# Patient Record
Sex: Male | Born: 1960
Health system: Southern US, Community
[De-identification: ages and names within clinical notes are randomized; demographics above are authoritative.]

## PROBLEM LIST (undated history)

## (undated) DIAGNOSIS — J45909 Unspecified asthma, uncomplicated: Secondary | ICD-10-CM

## (undated) DIAGNOSIS — M1990 Unspecified osteoarthritis, unspecified site: Secondary | ICD-10-CM

## (undated) DIAGNOSIS — T7840XA Allergy, unspecified, initial encounter: Secondary | ICD-10-CM

## (undated) HISTORY — PX: SHOULDER ARTHROSCOPY WITH BICEPS TENDON REPAIR: SHX5674

## (undated) HISTORY — DX: Unspecified osteoarthritis, unspecified site: M19.90

## (undated) HISTORY — DX: Allergy, unspecified, initial encounter: T78.40XA

## (undated) HISTORY — DX: Unspecified asthma, uncomplicated: J45.909

---

## 1992-08-20 HISTORY — PX: MANDIBLE SURGERY: SHX707

## 2000-08-20 HISTORY — PX: CARPAL TUNNEL RELEASE: SHX101

## 2001-03-14 ENCOUNTER — Encounter: Admission: RE | Admit: 2001-03-14 | Discharge: 2001-03-14 | Payer: Self-pay | Admitting: *Deleted

## 2001-03-14 ENCOUNTER — Encounter: Payer: Self-pay | Admitting: Family Medicine

## 2001-03-26 ENCOUNTER — Encounter: Admission: RE | Admit: 2001-03-26 | Discharge: 2001-04-17 | Payer: Self-pay | Admitting: Family Medicine

## 2005-06-04 ENCOUNTER — Ambulatory Visit: Payer: Self-pay | Admitting: Gastroenterology

## 2005-06-18 ENCOUNTER — Ambulatory Visit: Payer: Self-pay | Admitting: Gastroenterology

## 2005-07-25 ENCOUNTER — Emergency Department (HOSPITAL_COMMUNITY): Admission: EM | Admit: 2005-07-25 | Discharge: 2005-07-25 | Payer: Self-pay | Admitting: Family Medicine

## 2006-01-15 ENCOUNTER — Emergency Department (HOSPITAL_COMMUNITY): Admission: EM | Admit: 2006-01-15 | Discharge: 2006-01-15 | Payer: Self-pay | Admitting: Family Medicine

## 2006-05-22 IMAGING — CR DG WRIST COMPLETE 3+V*R*
4 series · 4 of 4 positions shown · non-contrast
Comparison: none

CLINICAL DATA: 44-year-old, wrist swelling, no known injury.   
 RIGHT WRIST - 3 VIEW:
 Joint spaces are maintained.  No fractures are seen.  No significant degenerative changes.

[view not recorded (1 of 4)]
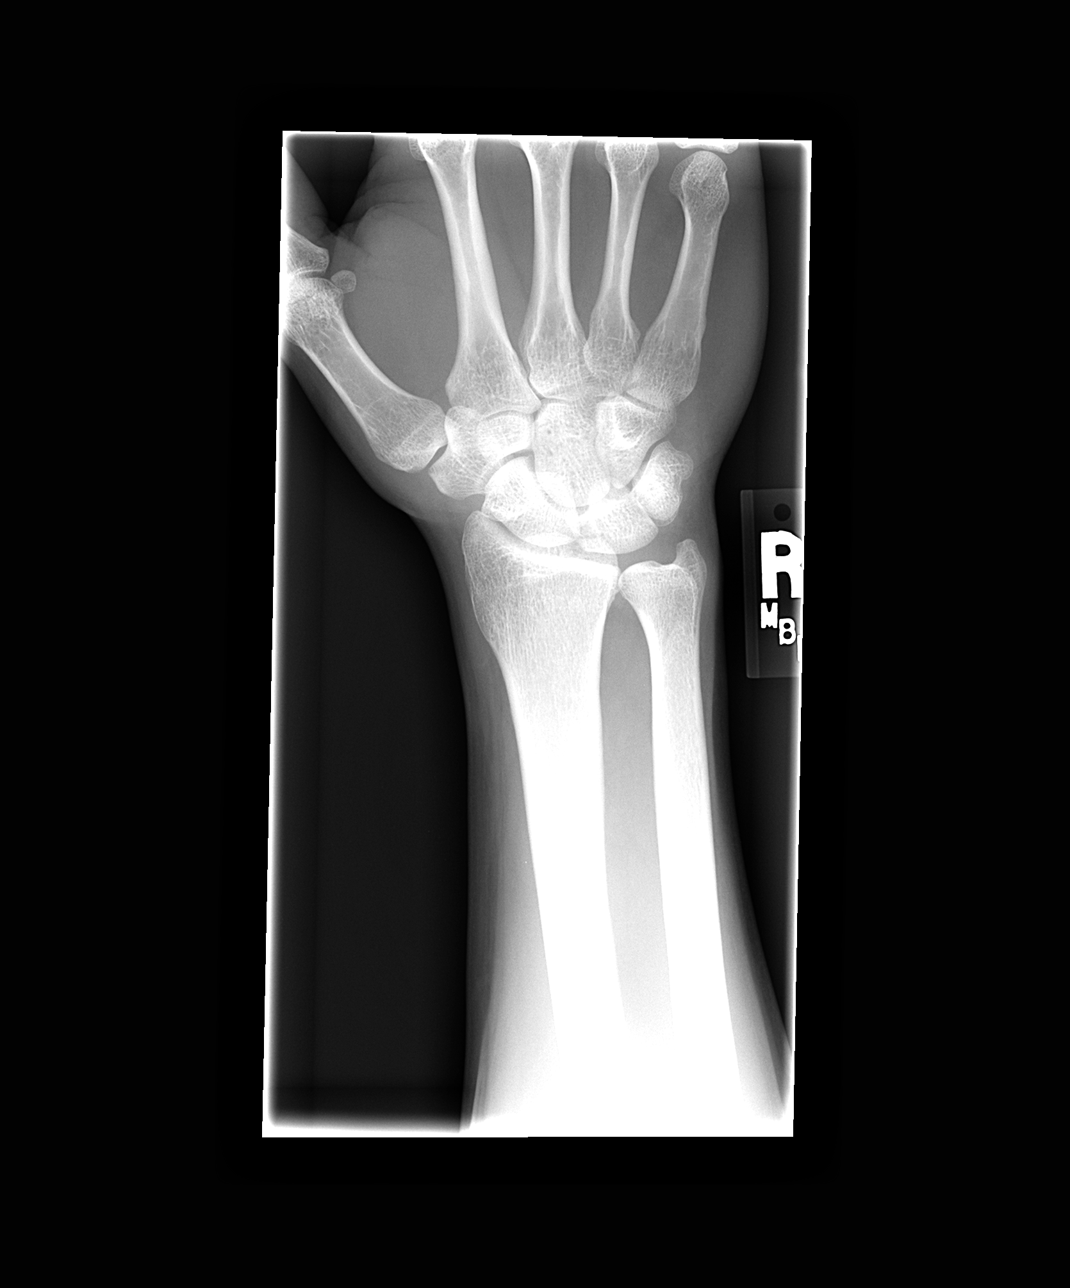

[view not recorded (2 of 4)]
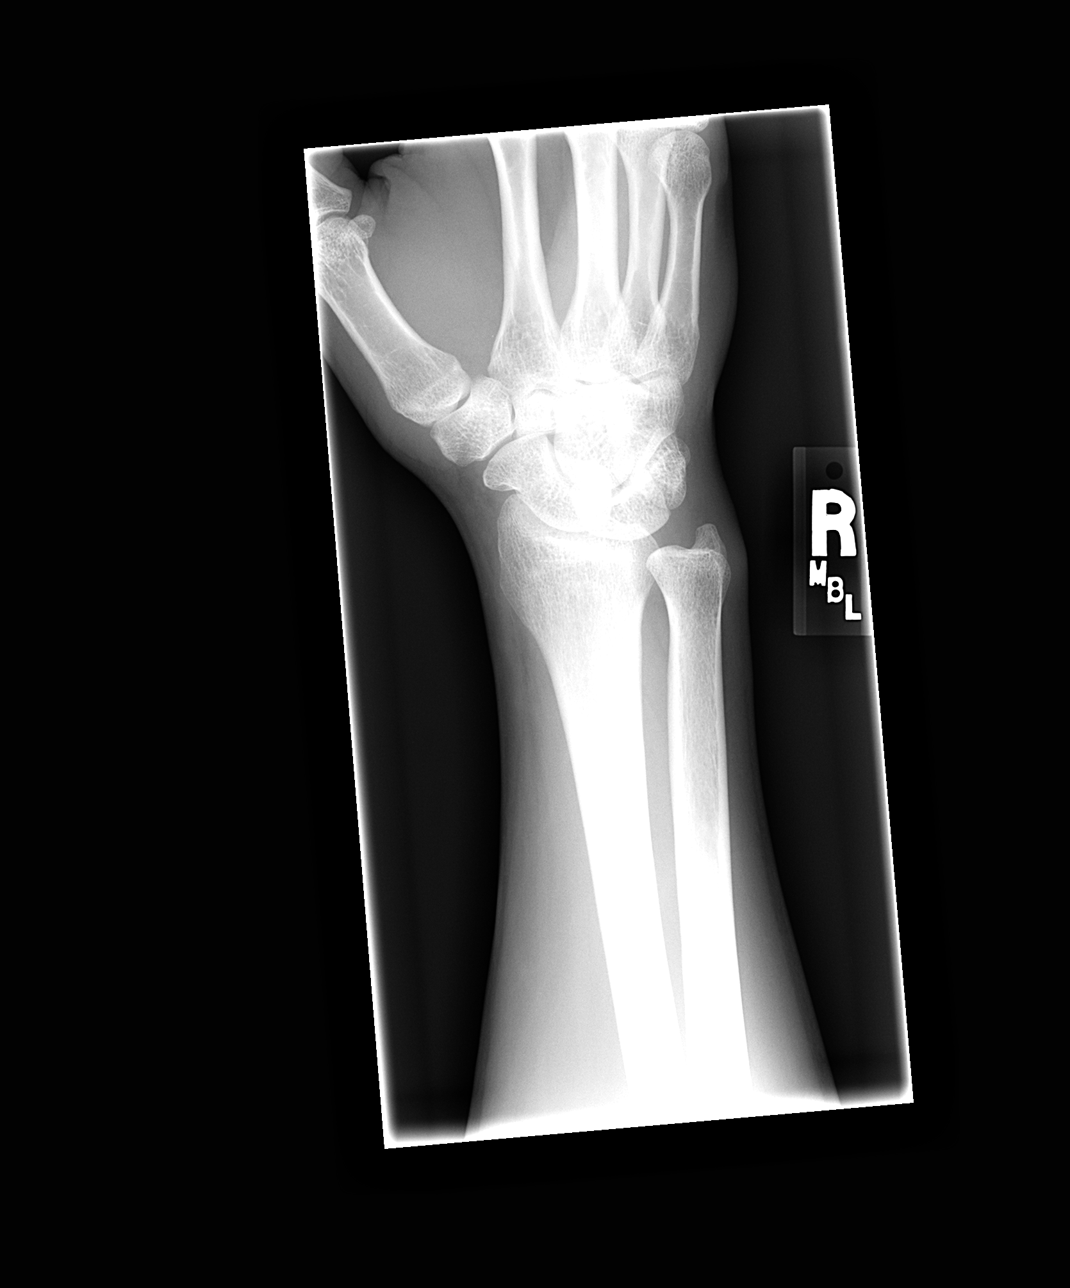

[view not recorded (3 of 4)]
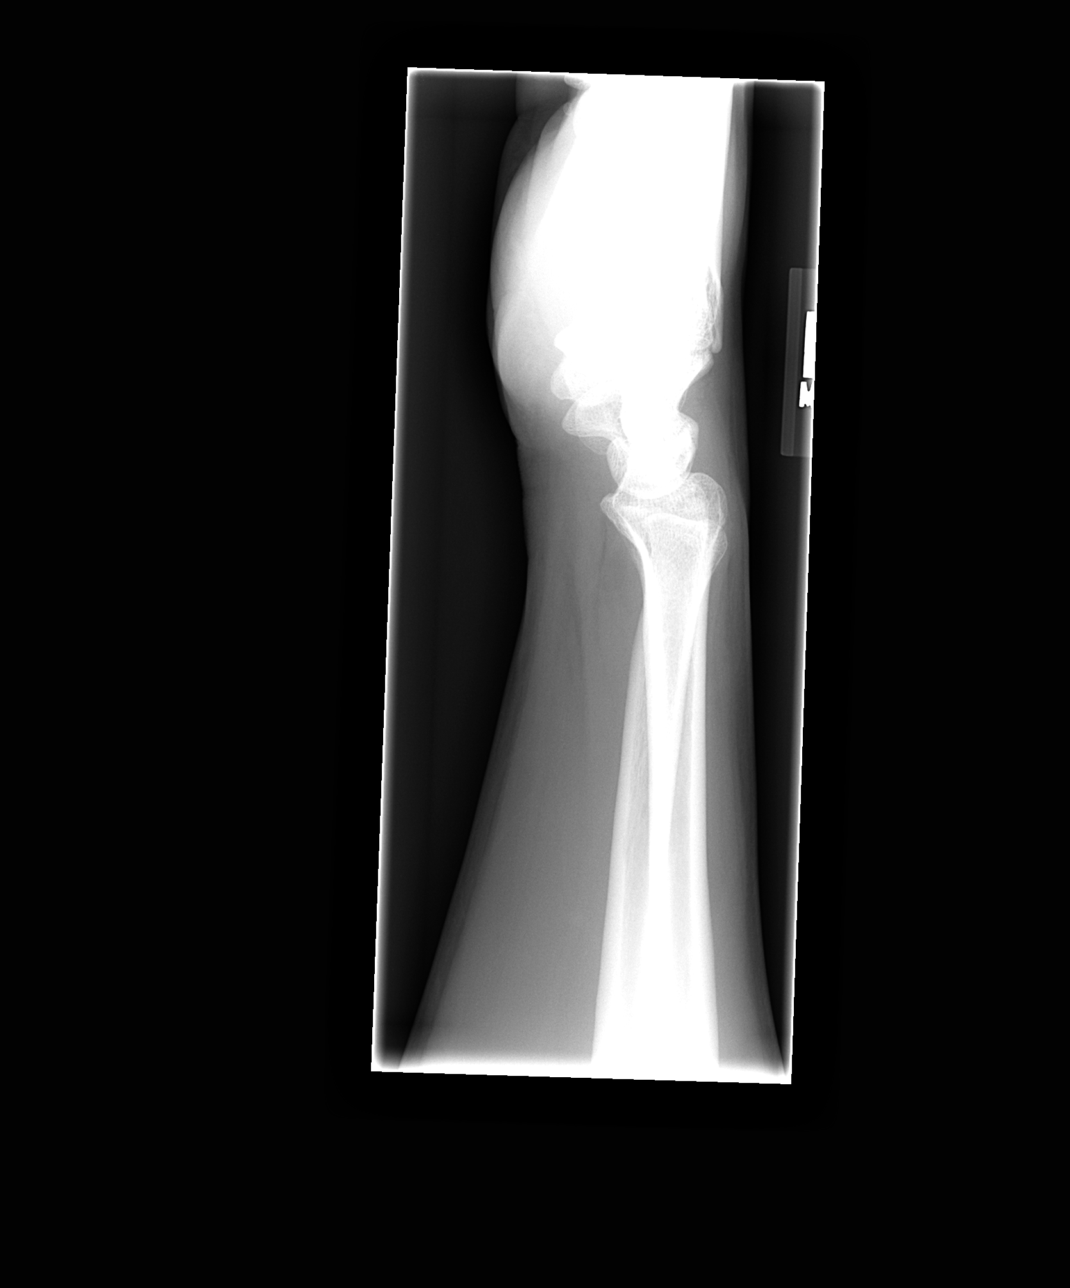

[view not recorded (4 of 4)]
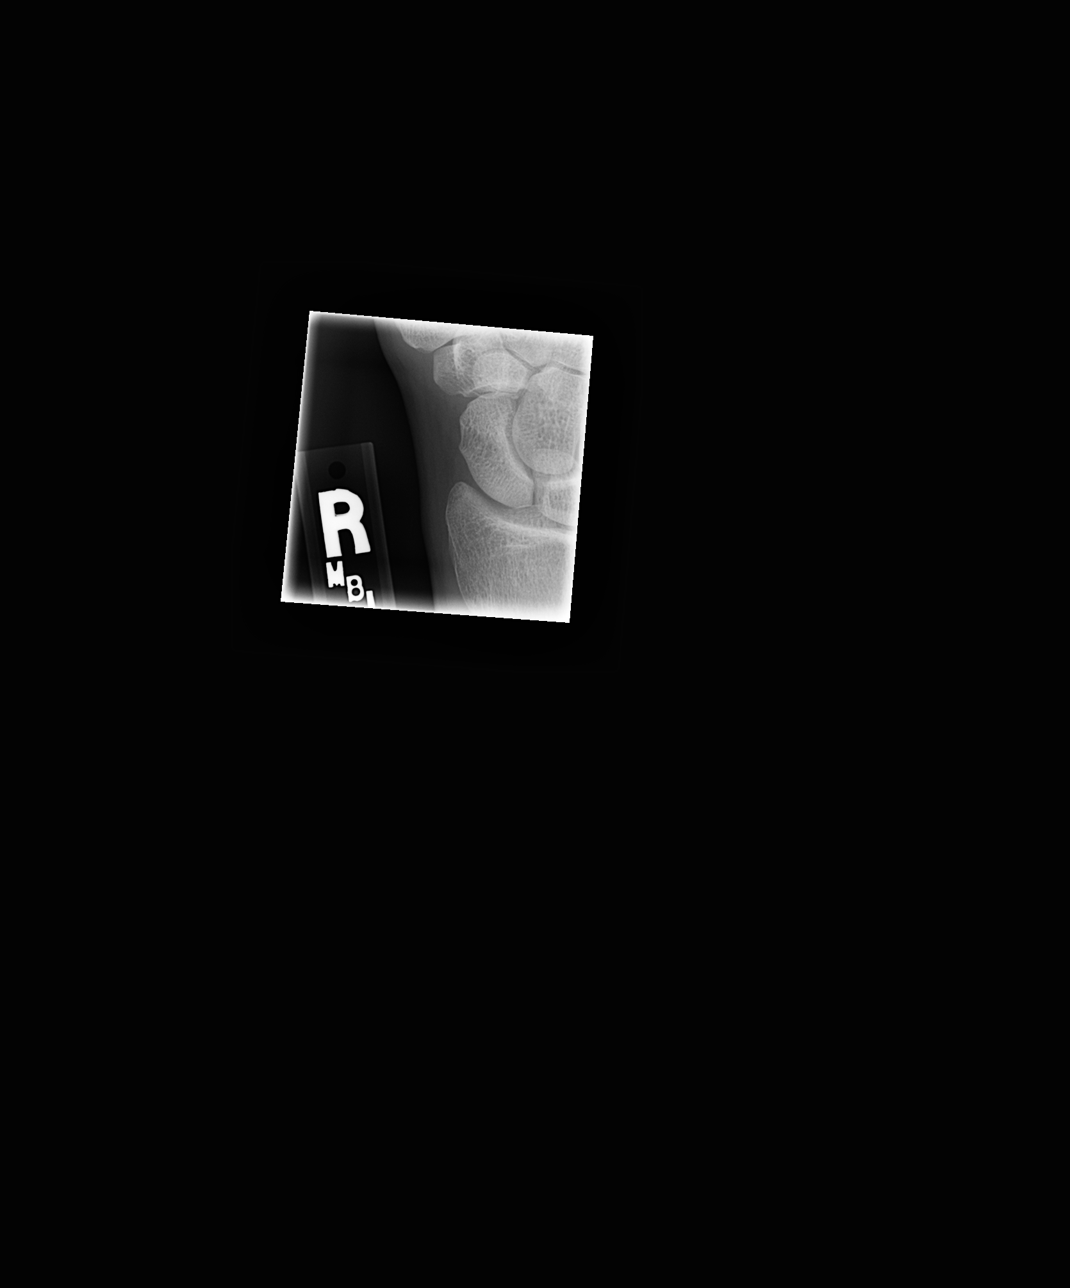

[4 of 4 positions shown; findings below may reference images not displayed]

IMPRESSION: No acute bony findings.

## 2010-07-18 ENCOUNTER — Encounter: Payer: Self-pay | Admitting: Gastroenterology

## 2010-09-21 NOTE — Letter (Signed)
Summary: Colonoscopy Letter  [facility]  [address]   [place], [place] [address]   Phone: [phone]  Fax: [phone]      July 18, 2010 MRN: [id number]   MICKAL MENO [address] [place], [place]  [address]   Dear Mr. Klutts,   According to your medical record, it is time for you to schedule a Colonoscopy. The American Cancer Society recommends this procedure as a method to detect early colon cancer. Patients with a family history of colon cancer, or a personal history of colon polyps or inflammatory bowel disease are at increased risk.  This letter has been generated based on the recommendations made at the time of your procedure. If you feel that in your particular situation this may no longer apply, please contact our office.  Please call our office at [phone] to schedule this appointment or to update your records at your earliest convenience.  Thank you for cooperating with [place] to provide you with the very best care possible.   Sincerely,   Barbette Hair. Arlyce Dice, M.D.  [facility] [phone]

## 2013-05-09 ENCOUNTER — Ambulatory Visit (INDEPENDENT_AMBULATORY_CARE_PROVIDER_SITE_OTHER): Payer: PRIVATE HEALTH INSURANCE | Admitting: Family Medicine

## 2013-05-09 VITALS — BP 130/86 | HR 68 | Temp 98.0°F | Resp 16 | Ht 71.0 in | Wt 186.0 lb

## 2013-05-09 DIAGNOSIS — R05 Cough: Secondary | ICD-10-CM

## 2013-05-09 DIAGNOSIS — R059 Cough, unspecified: Secondary | ICD-10-CM

## 2013-05-09 DIAGNOSIS — H109 Unspecified conjunctivitis: Secondary | ICD-10-CM

## 2013-05-09 MED ORDER — TOBRAMYCIN 0.3 % OP SOLN: 1.0000 [drp] | Freq: Four times a day (QID) | OPHTHALMIC | Status: DC

## 2013-05-09 MED ORDER — HYDROCODONE-HOMATROPINE 5-1.5 MG/5ML PO SYRP: 5.0000 mL | ORAL_SOLUTION | Freq: Three times a day (TID) | ORAL | Status: DC | PRN

## 2013-05-09 MED ORDER — AZITHROMYCIN 250 MG PO TABS: ORAL_TABLET | ORAL | Status: DC

## 2013-05-09 NOTE — Patient Instructions (Addendum)
Cough, Adult  A cough is a reflex that helps clear your throat and airways. It can help heal the body or may be a reaction to an irritated airway. A cough may only last 2 or 3 weeks (acute) or may last more than 8 weeks (chronic).  CAUSES Acute cough:  Viral or bacterial infections. Chronic cough:  Infections.  Allergies.  Asthma.  Post-nasal drip.  Smoking.  Heartburn or acid reflux.  Some medicines.  Chronic lung problems (COPD).  Cancer. SYMPTOMS   Cough.  Fever.  Chest pain.  Increased breathing rate.  High-pitched whistling sound when breathing (wheezing).  Colored mucus that you cough up (sputum). TREATMENT   A bacterial cough may be treated with antibiotic medicine.  A viral cough must run its course and will not respond to antibiotics.  Your caregiver may recommend other treatments if you have a chronic cough. HOME CARE INSTRUCTIONS   Only take over-the-counter or prescription medicines for pain, discomfort, or fever as directed by your caregiver. Use cough suppressants only as directed by your caregiver.  Use a cold steam vaporizer or humidifier in your bedroom or home to help loosen secretions.  Sleep in a semi-upright position if your cough is worse at night.  Rest as needed.  Stop smoking if you smoke. SEEK IMMEDIATE MEDICAL CARE IF:   You have pus in your sputum.  Your cough starts to worsen.  You cannot control your cough with suppressants and are losing sleep.  You begin coughing up blood.  You have difficulty breathing.  You develop pain which is getting worse or is uncontrolled with medicine.  You have a fever. MAKE SURE YOU:   Understand these instructions.  Will watch your condition.  Will get help right away if you are not doing well or get worse. Document Released: 02/02/2011 Document Revised: 10/29/2011 Document Reviewed: 02/02/2011 ExitCare Patient Information 2014 [employer]. Conjunctivitis Conjunctivitis is  commonly called "pink eye." Conjunctivitis can be caused by bacterial or viral infection, allergies, or injuries. There is usually redness of the lining of the eye, itching, discomfort, and sometimes discharge. There may be deposits of matter along the eyelids. A viral infection usually causes a watery discharge, while a bacterial infection causes a yellowish, thick discharge. Pink eye is very contagious and spreads by direct contact. You may be given antibiotic eyedrops as part of your treatment. Before using your eye medicine, remove all drainage from the eye by washing gently with warm water and cotton balls. Continue to use the medication until you have awakened 2 mornings in a row without discharge from the eye. Do not rub your eye. This increases the irritation and helps spread infection. Use separate towels from other household members. Wash your hands with soap and water before and after touching your eyes. Use cold compresses to reduce pain and sunglasses to relieve irritation from light. Do not wear contact lenses or wear eye makeup until the infection is gone. SEEK MEDICAL CARE IF:   Your symptoms are not better after 3 days of treatment.  You have increased pain or trouble seeing.  The outer eyelids become very red or swollen. Document Released: 09/13/2004 Document Revised: 10/29/2011 Document Reviewed: 08/06/2005 ExitCare Patient Information 2014 [employer].  

## 2013-05-09 NOTE — Progress Notes (Signed)
Chief Complaint:  Chief Complaint  Patient presents with  . Conjunctivitis    x today  . Cough    x 2 day    HPI: 52 y.o. year old male presents with a 2 day history of eye redness involving both  eye.  no contact use  No sick contacts, recent antibiotics, or recent travels.   No leg trauma, sedentary periods, h/o cancer, or tobacco use.  Past Medical History  Diagnosis Date  . Arthritis   . Allergy      Home Meds: Prior to Admission medications   Medication Sig Start Date End Date Taking? Authorizing Provider  Adalimumab (HUMIRA PEN [place]) Inject into the skin.   Yes Historical Provider, MD  Folic Acid-Vit B6-Vit B12 (FOLGARD PO) Take by mouth.   Yes Historical Provider, MD  loratadine (CLARITIN) 10 MG tablet Take 10 mg by mouth daily.   Yes Historical Provider, MD  omeprazole (PRILOSEC) 20 MG capsule Take 20 mg by mouth daily.   Yes Historical Provider, MD  azithromycin (ZITHROMAX Z-PAK) 250 MG tablet Take as directed on pack 05/09/13   Elvina Sidle, MD  HYDROcodone-homatropine [facility]) 5-1.5 MG/5ML syrup Take 5 mLs by mouth every 8 (eight) hours as needed for cough. 05/09/13   Elvina Sidle, MD  tobramycin (TOBREX) 0.3 % ophthalmic solution Place 1 drop into both eyes every 6 (six) hours. 05/09/13   Elvina Sidle, MD    Allergies:  Allergies  Allergen Reactions  . Sulfa Antibiotics Hives    History   Social History  . Marital Status: Married    Spouse Name: N/A    Number of Children: N/A  . Years of Education: N/A   Occupational History  . Not on file.   Social History Main Topics  . Smoking status: Never Smoker   . Smokeless tobacco: Not on file  . Alcohol Use: No  . Drug Use: No  . Sexual Activity: Yes   Other Topics Concern  . Not on file   Social History Narrative  . No narrative on file     Review of Systems: Constitutional: negative for chills, fever, night sweats or weight changes Cardiovascular: negative for chest pain or  palpitations Respiratory: negative for hemoptysis, wheezing, or shortness of breath Abdominal: negative for abdominal pain, nausea, vomiting or diarrhea Dermatological: negative for rash Neurologic: negative for headache   Physical Exam: Blood pressure 130/86, pulse 68, temperature 98 F (36.7 C), temperature source Oral, resp. rate 16, height 5\' 11"  (1.803 m), weight 186 lb (84.369 kg), SpO2 99.00%., Body mass index is 25.95 kg/(m^2). General: Well developed, well nourished, in no acute distress. Head: Normocephalic, atraumatic, nares are congested. Bilateral auditory canals clear, TM's are without perforation, pearly grey with reflective cone of light bilaterally. No sinus TTP. Oral cavity moist, dentition normal. Posterior pharynx with post nasal drip and mild erythema. No peritonsillar abscess or tonsillar exudate. Eyes:  Injection both eye, mild discharge at lid margins Neck: Supple. No thyromegaly. Full ROM. No lymphadenopathy. Lungs: Coarse breath sounds bilaterally without wheezes, rales, or rhonchi. Breathing is unlabored.  Heart: RRR with S1 S2. No murmurs, rubs, or gallops appreciated. Msk:  Strength and tone normal for age. Extremities: No clubbing or cyanosis. No edema. No joint deformity Neuro: Alert and oriented X 3. Moves all extremities spontaneously. CNII-XII grossly in tact. Psych:  Responds to questions appropriately with a normal affect.   Labs:   ASSESSMENT AND PLAN:  52 y.o. year old male with conjunctivitis involving both eyes.  Also has dry cough and is taking Humira for RA. - Pt ed given including hygiene measures Conjunctivitis - Plan: tobramycin (TOBREX) 0.3 % ophthalmic solution  Cough - Plan: azithromycin (ZITHROMAX Z-PAK) 250 MG tablet, HYDROcodone-homatropine (HYCODAN) 5-1.5 MG/5ML syrup   -RTC precautions -RTC 3-5 days if no improvement  Signed, Elvina Sidle, MD 05/09/2013 2:03 PM

## 2014-08-23 ENCOUNTER — Other Ambulatory Visit (HOSPITAL_COMMUNITY): Payer: Self-pay | Admitting: *Deleted

## 2014-08-24 ENCOUNTER — Encounter (HOSPITAL_COMMUNITY)
Admission: RE | Admit: 2014-08-24 | Discharge: 2014-08-24 | Disposition: A | Discharge status: Home / Self Care | Payer: PRIVATE HEALTH INSURANCE | Source: Ambulatory Visit | Attending: Rheumatology | Admitting: Rheumatology

## 2014-08-24 DIAGNOSIS — M069 Rheumatoid arthritis, unspecified: Secondary | ICD-10-CM | POA: Insufficient documentation

## 2014-08-24 MED ORDER — SODIUM CHLORIDE 0.9 % IV SOLN
176.3600 mg | Freq: Once | INTRAVENOUS | Status: DC
  Administered 2014-08-24: 176.36 mg via INTRAVENOUS
  Filled 2014-08-24: qty 14.1

## 2014-08-24 MED ORDER — SODIUM CHLORIDE 0.9 % IV SOLN
INTRAVENOUS | Status: DC
  Administered 2014-08-24: 14:00:00 250 mL via INTRAVENOUS
  Administered 2014-08-24: 14:00:00 via INTRAVENOUS

## 2014-10-18 ENCOUNTER — Other Ambulatory Visit (HOSPITAL_COMMUNITY): Payer: Self-pay | Admitting: *Deleted

## 2014-10-19 ENCOUNTER — Encounter (HOSPITAL_COMMUNITY)
Admission: RE | Admit: 2014-10-19 | Discharge: 2014-10-19 | Disposition: A | Discharge status: Home / Self Care | Payer: PRIVATE HEALTH INSURANCE | Source: Ambulatory Visit | Attending: Rheumatology | Admitting: Rheumatology

## 2014-10-19 DIAGNOSIS — M069 Rheumatoid arthritis, unspecified: Secondary | ICD-10-CM | POA: Diagnosis present

## 2014-10-19 MED ORDER — SODIUM CHLORIDE 0.9 % IV SOLN
176.3600 mg | Freq: Once | INTRAVENOUS | Status: AC
  Administered 2014-10-19: 14:00:00 176.36 mg via INTRAVENOUS
  Filled 2014-10-19: qty 14.1

## 2014-10-19 MED ORDER — SODIUM CHLORIDE 0.9 % IV SOLN: INTRAVENOUS | Status: DC

## 2014-12-21 ENCOUNTER — Encounter (HOSPITAL_COMMUNITY): Payer: Self-pay

## 2016-05-29 ENCOUNTER — Ambulatory Visit: Payer: PRIVATE HEALTH INSURANCE | Admitting: Rheumatology

## 2016-05-29 DIAGNOSIS — M79641 Pain in right hand: Secondary | ICD-10-CM | POA: Diagnosis not present

## 2016-05-29 DIAGNOSIS — M0579 Rheumatoid arthritis with rheumatoid factor of multiple sites without organ or systems involvement: Secondary | ICD-10-CM | POA: Diagnosis not present

## 2016-05-29 DIAGNOSIS — Z09 Encounter for follow-up examination after completed treatment for conditions other than malignant neoplasm: Secondary | ICD-10-CM

## 2016-05-29 DIAGNOSIS — M79642 Pain in left hand: Secondary | ICD-10-CM | POA: Diagnosis not present

## 2016-06-15 ENCOUNTER — Telehealth: Payer: Self-pay | Admitting: Radiology

## 2016-06-15 ENCOUNTER — Other Ambulatory Visit: Payer: Self-pay | Admitting: Rheumatology

## 2016-06-15 DIAGNOSIS — Z9225 Personal history of immunosupression therapy: Secondary | ICD-10-CM

## 2016-06-15 NOTE — Telephone Encounter (Signed)
Last visit 05/29/16 Next visit Labs 04/30/16 He is due for TB gold will call him,  

## 2016-06-15 NOTE — Telephone Encounter (Signed)
Patient needs follow up appt with Dr Corliss Skains pls call to make appt.  HE was here on 05/29/16, he should have been sch for a 6 month follow up please call him

## 2016-06-15 NOTE — Telephone Encounter (Signed)
Last visit 05/29/16 Next visit Labs 04/30/16 He is due for TB gold will call him,

## 2016-06-18 NOTE — Telephone Encounter (Signed)
LMOM for patient to call back and schedule April follow up.  

## 2016-06-19 NOTE — Telephone Encounter (Signed)
I have called him to advise. Left message mailed him the lab order

## 2016-07-03 ENCOUNTER — Telehealth: Payer: Self-pay | Admitting: Radiology

## 2016-07-03 ENCOUNTER — Encounter: Payer: Self-pay | Admitting: Rheumatology

## 2016-07-03 NOTE — Telephone Encounter (Signed)
Labs WNL CBC CMP will call pt / sent for scan

## 2016-07-03 NOTE — Telephone Encounter (Signed)
I have called patient to advise labs are normal  

## 2016-07-03 NOTE — Telephone Encounter (Signed)
TB gold also neg / abstracted

## 2016-08-10 ENCOUNTER — Other Ambulatory Visit: Payer: Self-pay | Admitting: Rheumatology

## 2016-08-10 NOTE — Telephone Encounter (Signed)
Last Visit: 05/29/16 Next Visit: 11/20/16 Labs: 06/29/16 Creat. 1.28 TB Gold: 06/29/16 Neg  Okay to refill Simponi?

## 2016-08-10 NOTE — Telephone Encounter (Signed)
ok 

## 2016-08-22 ENCOUNTER — Other Ambulatory Visit: Payer: Self-pay | Admitting: Rheumatology

## 2016-08-22 MED ORDER — GOLIMUMAB 50 MG/0.5ML ~~LOC~~ SOSY: PREFILLED_SYRINGE | SUBCUTANEOUS | 2 refills | Status: DC

## 2016-08-22 NOTE — Telephone Encounter (Signed)
Patient asked this be sent to [facility] not [facility]. Please resend to [facility]. Cb# [phone]

## 2016-08-22 NOTE — Telephone Encounter (Signed)
Okay 

## 2016-08-22 NOTE — Telephone Encounter (Signed)
Patient is requesting refill of Simponi be sent to [facility]. Fax# [phone]. He is requesting that it be sent by 2pm in order for him to receive it in time for when he is due.

## 2016-08-22 NOTE — Telephone Encounter (Signed)
Last Visit: 05/29/16 Next Visit: 11/20/16 Labs: 06/29/16 Creat. 1.28 TB Gold: 06/29/16 Neg  Okay to refill Simponi?  

## 2016-08-23 ENCOUNTER — Other Ambulatory Visit: Payer: Self-pay | Admitting: *Deleted

## 2016-08-23 MED ORDER — LEFLUNOMIDE 10 MG PO TABS: 10.0000 mg | ORAL_TABLET | Freq: Every day | ORAL | 0 refills | Status: DC

## 2016-08-23 MED ORDER — FOLGARD PO TABS: 2.0000 | ORAL_TABLET | Freq: Every day | ORAL | 3 refills | Status: DC

## 2016-08-23 MED ORDER — GOLIMUMAB 50 MG/0.5ML ~~LOC~~ SOSY: PREFILLED_SYRINGE | SUBCUTANEOUS | 2 refills | Status: DC

## 2016-08-23 NOTE — Telephone Encounter (Signed)
ok 

## 2016-08-23 NOTE — Progress Notes (Signed)
Prescription resent to [facility] instead of patient's local pharmacy.

## 2016-08-23 NOTE — Telephone Encounter (Signed)
Refill request received via fax for Folgard and Leflunomide   Last Visit: 05/29/16 Next Visit: 11/20/16 Labs: 06/29/16 Creat. 1.28  Okay to refill Folgard and Leflunomide?

## 2016-08-24 ENCOUNTER — Telehealth: Payer: Self-pay | Admitting: Rheumatology

## 2016-08-24 NOTE — Telephone Encounter (Signed)
Timothy Waters w/[facility] states the accidentally put the wrong instructions on the patients Simponi injection; 100 mg instead of 50mg . They have contacted the patient and he is aware to only take 50mg 

## 2016-09-13 ENCOUNTER — Other Ambulatory Visit: Payer: Self-pay | Admitting: Rheumatology

## 2016-09-17 ENCOUNTER — Other Ambulatory Visit: Payer: Self-pay | Admitting: *Deleted

## 2016-09-17 MED ORDER — OMEPRAZOLE 20 MG PO CPDR: 20.0000 mg | DELAYED_RELEASE_CAPSULE | Freq: Every day | ORAL | 0 refills | Status: DC

## 2016-09-17 NOTE — Telephone Encounter (Signed)
Refill Request received via fax for Omeprazole   Last Visit: 05/29/16 Next Visit: 11/20/16 Labs: 06/29/16 Creat. 1.28  Okay to refill Omeprazole?

## 2016-09-17 NOTE — Telephone Encounter (Signed)
ok 

## 2016-09-25 ENCOUNTER — Telehealth: Payer: Self-pay | Admitting: Rheumatology

## 2016-09-25 NOTE — Telephone Encounter (Signed)
Patient states the rx for omeprazole was the incorrect dosage. He states he was supposed to receive 40 mg a day for a 90 day supply and only received 20 mg a day for 90 days. Patient is requesting this rx be corrected and resent to [facility].

## 2016-09-26 MED ORDER — OMEPRAZOLE 40 MG PO CPDR: 40.0000 mg | DELAYED_RELEASE_CAPSULE | Freq: Every day | ORAL | 0 refills | Status: DC

## 2016-09-26 NOTE — Telephone Encounter (Signed)
Per Mr. Leane Call okay to send in the Omeprazole 40 mg for 90 supply. Prescription sent to the pharmacy. Patient advised.

## 2016-09-27 ENCOUNTER — Other Ambulatory Visit: Payer: Self-pay | Admitting: *Deleted

## 2016-09-27 MED ORDER — OMEPRAZOLE 40 MG PO CPDR: 40.0000 mg | DELAYED_RELEASE_CAPSULE | Freq: Every day | ORAL | 0 refills | Status: DC

## 2016-10-16 ENCOUNTER — Other Ambulatory Visit: Payer: Self-pay | Admitting: Rheumatology

## 2016-10-16 NOTE — Telephone Encounter (Signed)
Previous Creat. On 04/30/16 was 1.40

## 2016-10-16 NOTE — Telephone Encounter (Signed)
Is Cr higher. Prev values?

## 2016-10-16 NOTE — Telephone Encounter (Signed)
Last Visit: 05/29/16 Next Visit: 11/20/16 Labs: 06/29/16 Creat. 1.28 Patient states he is going today or tomorrow to update labs  Okay to refill Arava?

## 2016-10-16 NOTE — Telephone Encounter (Signed)
ok 

## 2016-10-22 ENCOUNTER — Telehealth: Payer: Self-pay | Admitting: Rheumatology

## 2016-10-22 NOTE — Telephone Encounter (Signed)
Patient is at his family physician office and was going to have some lab work done while he is there.  He had a standing lab order but has since expired.  He is needing a new one faxed to their office. FAX#[phone].  Patient's [id number].  Thank you.

## 2016-10-23 ENCOUNTER — Other Ambulatory Visit: Payer: Self-pay | Admitting: *Deleted

## 2016-10-23 DIAGNOSIS — Z79899 Other long term (current) drug therapy: Secondary | ICD-10-CM

## 2016-10-23 NOTE — Telephone Encounter (Signed)
Lab Orders faxed

## 2016-10-23 NOTE — Telephone Encounter (Signed)
Patient called and stated that his doctor's office has not received his standing order so that he can get his lab done at the doctor's office @ 3:45 today.  Thank you.

## 2016-10-24 ENCOUNTER — Other Ambulatory Visit: Payer: Self-pay | Admitting: *Deleted

## 2016-10-24 MED ORDER — GOLIMUMAB 50 MG/0.5ML ~~LOC~~ SOSY: PREFILLED_SYRINGE | SUBCUTANEOUS | 2 refills | Status: DC

## 2016-10-24 NOTE — Telephone Encounter (Signed)
ok 

## 2016-10-24 NOTE — Telephone Encounter (Signed)
Refill Request received via fax  Last Visit: 05/29/16 Next Visit: 11/20/16 Labs: 06/29/16 Creat. 1.28 previous was 1.40 Patient updated labs yesterday. TB Gold: 06/29/16  Okay to refill Simponi?

## 2016-10-29 ENCOUNTER — Telehealth: Payer: Self-pay | Admitting: Rheumatology

## 2016-10-29 NOTE — Telephone Encounter (Signed)
Patient called about lab results since he has not heard anything from the office yet. Please advise.

## 2016-10-30 ENCOUNTER — Telehealth: Payer: Self-pay | Admitting: *Deleted

## 2016-10-30 NOTE — Telephone Encounter (Signed)
Received patient's CBC and CMP that were drawn on 10/23/16 and advised him they are normal .

## 2016-10-30 NOTE — Telephone Encounter (Signed)
Patient advised we have not received labs. Patient will call the lab and them faxed again. Patient provided with the fax number to our office again.

## 2016-11-08 DIAGNOSIS — Z79899 Other long term (current) drug therapy: Secondary | ICD-10-CM | POA: Insufficient documentation

## 2016-11-08 DIAGNOSIS — L80 Vitiligo: Secondary | ICD-10-CM | POA: Insufficient documentation

## 2016-11-08 DIAGNOSIS — M0579 Rheumatoid arthritis with rheumatoid factor of multiple sites without organ or systems involvement: Secondary | ICD-10-CM | POA: Insufficient documentation

## 2016-11-08 NOTE — Progress Notes (Signed)
Office Visit Note  Patient: Timothy Waters             Date of Birth: Aug 30, 1960           MRN: [id number]             PCP: No primary care provider on file. Referring: No ref. provider found Visit Date: 11/20/2016 Occupation: @GUAROCC @    Subjective:  Stiffness in hands   History of Present Illness: Timothy Waters is a 56 y.o. male with history of rheumatoid arthritis. According to patient he is gradually improving and he is about 95% better. He states he has some stiffness in his hands but he has not noticed any joint swelling. His foot discomfort has improved as well. After the last visit we tried Arava 20 mg by mouth daily that he had to decrease it down to 10 mg by mouth daily due to nausea. He's been taking Simponi subcutaneously without any problems.  Activities of Daily Living:  Patient reports morning stiffness forminutes.   Patient Denies nocturnal pain.  Difficulty dressing/grooming: Denies Difficulty climbing stairs: Denies Difficulty getting out of chair: Denies Difficulty using hands for taps, buttons, cutlery, and/or writing: Denies   Review of Systems  Constitutional: Positive for fatigue. Negative for night sweats and weakness ( ).  HENT: Negative for mouth sores, mouth dryness and nose dryness.   Eyes: Negative for redness and dryness.  Respiratory: Negative for shortness of breath and difficulty breathing.   Cardiovascular: Negative for chest pain, palpitations, hypertension, irregular heartbeat and swelling in legs/feet.  Gastrointestinal: Negative for constipation and diarrhea.  Endocrine: Negative for increased urination.  Musculoskeletal: Positive for arthralgias, joint pain and morning stiffness. Negative for joint swelling, myalgias, muscle weakness, muscle tenderness and myalgias.  Skin: Negative for color change, rash, hair loss, nodules/bumps, skin tightness, ulcers and sensitivity to sunlight.  Allergic/Immunologic: Negative for susceptible to  infections.  Neurological: Negative for dizziness, fainting, memory loss and night sweats.  Hematological: Negative for swollen glands.  Psychiatric/Behavioral: Negative for depressed mood and sleep disturbance. The patient is not nervous/anxious.     PMFS History:  Patient Active Problem List   Diagnosis Date Noted  . Rheumatoid arthritis involving multiple sites with positive rheumatoid factor (HCC) 11/08/2016  . High risk medication use 11/08/2016  . Vitiligo 11/08/2016    Past Medical History:  Diagnosis Date  . Allergy   . Arthritis     Family History  Problem Relation Age of Onset  . Diabetes Mother   . Cancer Mother   . COPD Father   . Diabetes Father    No past surgical history on file. Social History   Social History Narrative  . No narrative on file     Objective: Vital Signs: BP 138/78   Pulse 78   Resp 16   Wt 190 lb (86.2 kg)   BMI 26.50 kg/m    Physical Exam  Constitutional: He is oriented to person, place, and time. He appears well-developed and well-nourished.  HENT:  Head: Normocephalic and atraumatic.  Eyes: Conjunctivae and EOM are normal. Pupils are equal, round, and reactive to light.  Neck: Normal range of motion. Neck supple.  Cardiovascular: Normal rate, regular rhythm and normal heart sounds.   Pulmonary/Chest: Effort normal and breath sounds normal.  Abdominal: Soft. Bowel sounds are normal.  Neurological: He is alert and oriented to person, place, and time.  Skin: Skin is warm and dry. Capillary refill takes less than 2 seconds.  Psychiatric: He  has a normal mood and affect. His behavior is normal.  Nursing note and vitals reviewed.    Musculoskeletal Exam: C-spine, thoracic, lumbar spine good range of motion. Shoulder joints elbow joints good range of motion. Wrists joint limitation of range of motion was noted. He had limitation of motion of right second MCP joint. Synovial thickening was noted over bilateral wrist joints and right  second MCP joint without any tenderness. Hip joints knee joints ankles MTPs PIPs with good range of motion with no synovitis.  CDAI Exam: CDAI Homunculus Exam:   Swelling:  RUE: wrist LUE: wrist Right hand: 2nd PIP  Joint Counts:  CDAI Tender Joint count: 0 CDAI Swollen Joint count: 3  Global Assessments:  Patient Global Assessment: 3 Provider Global Assessment: 4  CDAI Calculated Score: 10    Investigation: Findings:  September 2017:  Comprehensive metabolic panel showed creatinine of 1.4, GFR 65 and CBC was normal.    High risk medication use/ SULFA, METHOTREXATE caused elevated creatinine, ARAVA caused rash in the past, IMURAN he had a lot of side effects.  He had inadequate response to [place], [place], [place] and SIMPONI ARIA.  10/20/2015 X-rays of bilateral feet 2 views today show PIP, DIP narrowing and erosive changes in bilateral MTP joints.  Bilateral hand x-rays showed bilateral PIP narrowing, right 2nd MCP narrowing, bilateral 1st MCP narrowing, osteopenia and left ulnar styloid erosion.  His rapid 3 score was 2.3 which is consistent with moderate activity.  In December 2016, CBC was normal.  Comprehensive metabolic panel showed a glucose of 100.  LDL was 127, HDL was 87, and LDL-HDL ratio was 1.3.  06/29/2016 CBC normal, CMP creatinine 1.28, TB gold negative 10/23/2016 CBC WBC 3.9 hemoglobin 13.5 platelets 163, CMP creatinine 1.15 GFR 83 LFTs normal    Imaging: No results found.  Speciality Comments: No specialty comments available.    Procedures:  No procedures performed Allergies: Sulfa antibiotics   Assessment / Plan:     Visit Diagnoses: Rheumatoid arthritis involving multiple sites with positive rheumatoid factor (HCC) - Positive RF, positive anti-CCP. He still appears to have mild synovitis in his wrist joints and right second MCP joint. He had no tenderness on examination. He has some synovial thickening. According to him he is doing really  well without any joint discomfort and does not want to change any medications. He tried increasing Arava 20 mg but could not tolerate that.  High risk medication use - SimponiSQ, Arava 10 mg ( reaction to SSZ,MTX,and  IMURAN: inadeq response to Enbrel, Humira [place]be [place] and [employer]) . He showed me his labs on his cell phone from March 2018 which were within normal limits. He will need labs every 3 months to monitor for drug toxicity.  Vitiligo    Orders: Orders Placed This Encounter  Procedures  . CBC with Differential/Platelet  . COMPLETE METABOLIC PANEL WITH GFR   No orders of the defined types were placed in this encounter.   Face-to-face time spent with patient was 25 minutes. 50% of time was spent in counseling and coordination of care.  Follow-Up Instructions: Return in about 5 months (around 04/22/2017) for Rheumatoid arthritis.   Pollyann Savoy, MD  Note - This record has been created using Animal nutritionist.  Chart creation errors have been sought, but may not always  have been located. Such creation errors do not reflect on  the standard of medical care.

## 2016-11-19 ENCOUNTER — Other Ambulatory Visit: Payer: Self-pay | Admitting: Rheumatology

## 2016-11-20 ENCOUNTER — Encounter: Payer: Self-pay | Admitting: Rheumatology

## 2016-11-20 ENCOUNTER — Ambulatory Visit (INDEPENDENT_AMBULATORY_CARE_PROVIDER_SITE_OTHER): Payer: Commercial Managed Care - PPO | Admitting: Rheumatology

## 2016-11-20 VITALS — BP 138/78 | HR 78 | Resp 16 | Wt 190.0 lb

## 2016-11-20 DIAGNOSIS — M0579 Rheumatoid arthritis with rheumatoid factor of multiple sites without organ or systems involvement: Secondary | ICD-10-CM | POA: Diagnosis not present

## 2016-11-20 DIAGNOSIS — Z79899 Other long term (current) drug therapy: Secondary | ICD-10-CM | POA: Diagnosis not present

## 2016-11-20 DIAGNOSIS — L80 Vitiligo: Secondary | ICD-10-CM

## 2016-11-20 NOTE — Patient Instructions (Signed)
Standing Labs We placed an order today for your standing lab work.    Please come back and get your standing labs in June and every 3 months  We have open lab Monday through Friday from 8:30-11:30 AM and 1:30-4 PM at the office of Dr. Delilah Mulgrew/Naitik Panwala, PA.   The office is located at [address], [address], [place], [place] [address] No appointment is necessary.   Labs are drawn by [facility].  You may receive a bill from [facility] for your lab work.    

## 2016-11-20 NOTE — Telephone Encounter (Signed)
ok 

## 2016-11-20 NOTE — Telephone Encounter (Signed)
Last Visit: 05/29/16 Next Visit: 11/20/16  Okay to refill Omeprazole?

## 2016-12-21 ENCOUNTER — Other Ambulatory Visit: Payer: Self-pay | Admitting: *Deleted

## 2016-12-21 MED ORDER — FOLIC ACID 1 MG PO TABS: 2.0000 mg | ORAL_TABLET | Freq: Every day | ORAL | 3 refills | Status: DC

## 2016-12-21 NOTE — Progress Notes (Signed)
Folgard has been discontinued by manufacturer and is no longer available. [facility] faxed a request for a replacement medication. Per Dr. Corliss Skains. Okay to send prescription for Folic Acid 1 mg 2 tabs po dailg # 180 with 3 refills.

## 2017-01-01 ENCOUNTER — Ambulatory Visit (INDEPENDENT_AMBULATORY_CARE_PROVIDER_SITE_OTHER): Payer: Commercial Managed Care - PPO | Admitting: Rheumatology

## 2017-01-01 ENCOUNTER — Encounter: Payer: Self-pay | Admitting: Rheumatology

## 2017-01-01 VITALS — BP 122/74 | HR 76 | Resp 16 | Wt 192.0 lb

## 2017-01-01 DIAGNOSIS — Z79899 Other long term (current) drug therapy: Secondary | ICD-10-CM

## 2017-01-01 DIAGNOSIS — M65331 Trigger finger, right middle finger: Secondary | ICD-10-CM

## 2017-01-01 DIAGNOSIS — Z8719 Personal history of other diseases of the digestive system: Secondary | ICD-10-CM | POA: Insufficient documentation

## 2017-01-01 DIAGNOSIS — L80 Vitiligo: Secondary | ICD-10-CM

## 2017-01-01 DIAGNOSIS — M0579 Rheumatoid arthritis with rheumatoid factor of multiple sites without organ or systems involvement: Secondary | ICD-10-CM

## 2017-01-01 DIAGNOSIS — R7989 Other specified abnormal findings of blood chemistry: Secondary | ICD-10-CM

## 2017-01-01 DIAGNOSIS — R945 Abnormal results of liver function studies: Secondary | ICD-10-CM

## 2017-01-01 MED ORDER — TRIAMCINOLONE ACETONIDE 40 MG/ML IJ SUSP
10.0000 mg | INTRAMUSCULAR | Status: AC | PRN
  Administered 2017-01-01: 10 mg

## 2017-01-01 MED ORDER — LIDOCAINE HCL 1 % IJ SOLN
0.5000 mL | INTRAMUSCULAR | Status: AC | PRN
  Administered 2017-01-01: .5 mL

## 2017-01-01 NOTE — Progress Notes (Signed)
   Procedure Note  Patient: Timothy Waters             Date of Birth: [birth year]           MRN: [id number]             Visit Date: 01/01/2017  Procedures: Visit Diagnoses: Rheumatoid arthritis involving multiple sites with positive rheumatoid factor (HCC)  Vitiligo  High risk medication use  History of gastroesophageal reflux (GERD)  Elevated LFTs  Trigger finger, right middle finger  Hand/UE Inj Date/Time: 01/01/2017 2:56 PM Performed by: Pollyann Savoy Authorized by: Pollyann Savoy   Consent Given by:  Patient Site marked: the procedure site was marked   Timeout: prior to procedure the correct patient, procedure, and site was verified   Indications:  Therapeutic and tendon swelling Condition: trigger finger   Location:  Long finger Site:  R long A1 Prep: patient was prepped and draped in usual sterile fashion   Needle Size:  27 G Approach:  Volar Ultrasound Guidance: Yes   Medications:  0.5 mL lidocaine 1 %; 10 mg triamcinolone acetonide 40 MG/ML Aspirate amount (mL):  0 Patient tolerance:  Patient tolerated the procedure well with no immediate complications  Pollyann Savoy MD

## 2017-01-15 ENCOUNTER — Other Ambulatory Visit: Payer: Self-pay | Admitting: Rheumatology

## 2017-01-15 NOTE — Telephone Encounter (Signed)
Last Visit: 01/01/17 Next Visit: 04/23/17 Labs: 10/23/16 CBC/ CMP WNL  Okay to refill Arava?

## 2017-01-15 NOTE — Telephone Encounter (Signed)
ok 

## 2017-01-21 ENCOUNTER — Other Ambulatory Visit: Payer: Self-pay | Admitting: Rheumatology

## 2017-01-21 NOTE — Telephone Encounter (Signed)
ok 

## 2017-01-21 NOTE — Telephone Encounter (Signed)
Last Visit: 01/01/17 Next Visit: 04/23/17 Labs: 10/23/16 CBC/ CMP WNL TB Gold: 06/29/16 Neg  Okay to refill Simponi?

## 2017-02-04 ENCOUNTER — Telehealth: Payer: Self-pay | Admitting: Rheumatology

## 2017-02-04 MED ORDER — LEFLUNOMIDE 10 MG PO TABS: 10.0000 mg | ORAL_TABLET | Freq: Every day | ORAL | 0 refills | Status: DC

## 2017-02-04 NOTE — Telephone Encounter (Signed)
Refill request received via fax  Last Visit: 01/01/17 Next Visit: 04/23/17 Labs: 10/23/16 WNL  Okay to refill Arava?

## 2017-02-04 NOTE — Telephone Encounter (Signed)
ok 

## 2017-02-04 NOTE — Telephone Encounter (Signed)
Patient called wanting to know if we have received his authorization for his refill.  CB#[phone].  Thank you.

## 2017-02-04 NOTE — Telephone Encounter (Signed)
Prescription sent to the pharmacy and patient advised.  

## 2017-03-25 ENCOUNTER — Telehealth: Payer: Self-pay

## 2017-03-25 NOTE — Telephone Encounter (Signed)
Received a fax from [facility] stating that all specialty medications prior authorizations are handled by [facility] at [phone].   Called [facility] to verify. Spoke to [place] who states that she will fax the correct form to the clinic to be filled out and faxed back. A provider signature and office visit notes will be required.   Will update once we receive a response.   Will send document to scan center.  Davien Malone, [place], CPhT 4:22 PM

## 2017-03-26 NOTE — Telephone Encounter (Signed)
Received a fax from [employer] regarding a prior authorization approval for Simponi from 03/26/17-03/26/18.   Reference [id number] Phone number:[phone]  Will send document to scan center.  Called patient to give him the update. He states that he is due for a refill and he will reach out to the pharmacy today.  Arjan Strohm, [place], CPhT 1:59 PM

## 2017-03-26 NOTE — Telephone Encounter (Addendum)
Received the prior authorization request from [employer]. Authorization was completed and faxed along with last office visit notes. Will update once we receive a response.  Neysha Criado, [place], CPhT 10:51 AM

## 2017-04-16 ENCOUNTER — Other Ambulatory Visit: Payer: Self-pay | Admitting: Rheumatology

## 2017-04-16 NOTE — Telephone Encounter (Signed)
Last Visit: 01/01/17 Next Visit: 04/23/17  TB Gold: 06/29/16 Neg

## 2017-04-17 NOTE — Telephone Encounter (Signed)
Patient has a physical scheduled for 04/19/17. Patient will have PCP send copy of labs. Patient is not due for injection until mid September. Will refill after labs received

## 2017-04-18 NOTE — Progress Notes (Signed)
Office Visit Note  Patient: Timothy Waters             Date of Birth: [birth year]           MRN: [id number]             PCP: Patient, No Pcp Per Referring: No ref. provider found Visit Date: 04/23/2017 Occupation: @GUAROCC @    Subjective:  Pain hands and feet.   History of Present Illness: Timothy Waters is a 56 y.o. male  With history of sero positive rheumatoid arthritis. He states she's been doing quite well on combination of Simponi subcutaneous and Arava. He is been experiencing some discomfort in his bilateral heel. He does wear shoe inserts  With heel pads. He denies any joint swelling.  Activities of Daily Living:  Patient reports morning stiffness for ,1 minute.   Patient Denies nocturnal pain.  Difficulty dressing/grooming: Denies Difficulty climbing stairs: Denies Difficulty getting out of chair: Denies Difficulty using hands for taps, buttons, cutlery, and/or writing: Denies   Review of Systems  Constitutional: Negative for fatigue, night sweats and weakness ( ).  HENT: Negative for mouth sores, mouth dryness and nose dryness.   Eyes: Negative for redness.  Respiratory: Negative for shortness of breath and difficulty breathing.   Cardiovascular: Negative for chest pain, palpitations, hypertension, irregular heartbeat and swelling in legs/feet.  Gastrointestinal: Negative for constipation and diarrhea.  Endocrine: Negative for increased urination.  Musculoskeletal: Positive for arthralgias and joint pain. Negative for joint swelling, myalgias, muscle weakness, morning stiffness, muscle tenderness and myalgias.  Skin: Negative for color change, rash, hair loss, nodules/bumps, skin tightness, ulcers and sensitivity to sunlight.  Allergic/Immunologic: Negative for susceptible to infections.  Neurological: Negative for dizziness, fainting, memory loss and night sweats.  Hematological: Negative for swollen glands.  Psychiatric/Behavioral: Negative for depressed mood and  sleep disturbance. The patient is not nervous/anxious.     PMFS History:  Patient Active Problem List   Diagnosis Date Noted  . History of gastroesophageal reflux (GERD) 01/01/2017  . Rheumatoid arthritis involving multiple sites with positive rheumatoid factor (HCC) 11/08/2016  . High risk medication use 11/08/2016  . Vitiligo 11/08/2016    Past Medical History:  Diagnosis Date  . Allergy   . Arthritis     Family History  Problem Relation Age of Onset  . Diabetes Mother   . Cancer Mother   . COPD Father   . Diabetes Father    History reviewed. No pertinent surgical history. Social History   Social History Narrative  . No narrative on file     Objective: Vital Signs: BP 120/78   Pulse 78   Resp 14   Ht 5\' 11"  (1.803 m)   Wt 186 lb (84.4 kg)   BMI 25.94 kg/m    Physical Exam  Constitutional: He is oriented to person, place, and time. He appears well-developed and well-nourished.  HENT:  Head: Normocephalic and atraumatic.  Eyes: Pupils are equal, round, and reactive to light. Conjunctivae and EOM are normal.  Neck: Normal range of motion. Neck supple.  Cardiovascular: Normal rate, regular rhythm and normal heart sounds.   Pulmonary/Chest: Effort normal and breath sounds normal.  Abdominal: Soft. Bowel sounds are normal.  Neurological: He is alert and oriented to person, place, and time.  Skin: Skin is warm and dry. Capillary refill takes less than 2 seconds.  Vitiligo  Psychiatric: He has a normal mood and affect. His behavior is normal.  Nursing note and vitals reviewed.  Musculoskeletal Exam:  C-spine and thoracic lumbar spine good range of motion. Shoulder joints elbow joints wrist joints are good range of motion. He has some tenderness and mild synovitis over his left wrist joint and left fifth MCP joint. Hip joints knee joints ankles were good range of motion. He had tenderness on palpation over his left heel.  CDAI Exam: CDAI Homunculus Exam:    Tenderness:  LUE: wrist Left hand: 5th MCP  Swelling:  LUE: wrist Left hand: 5th MCP  Joint Counts:  CDAI Tender Joint count: 2 CDAI Swollen Joint count: 2  Global Assessments:  Patient Global Assessment: 3 Provider Global Assessment: 3  CDAI Calculated Score: 10    Investigation: Findings:  10/24/2016 normal CBC CMP TB Gold: 06/29/16 Neg  August 2018: Labs from patient's cell phone CBC hemoglobin 14.2 white cell count 5.5, platelets 176, CMP normal, AST 35 ALT 30, GFR 69, TSH normal, lipid panel LDL HDL ratio 2.6 triglycerides normal HDL 54   Imaging: Xr Foot 2 Views Left  Result Date: 04/23/2017  First MTP, all PIP/DIP narrowing was noted. Fifth MTP erosive changes were noted. There was questionable erosion over the second MTP. These findings are consistent with rheumatoid arthritis and osteoarthritis overlap.  Xr Foot 2 Views Right  Result Date: 04/23/2017  First and second MTP narrowing was noted. Spurring noted over the first MTP joint. Caution about erosive changes noted and fifth MTP joint. PIP/DIP narrowing was noted. Impression: These findings are consistent with rheumatoid arthritis and osteoarthritis overlap.  Xr Hand 2 View Left  Result Date: 04/23/2017  Juxta articular osteopenia  Noted mild narrowing of the second and third MCP joint and all PIP joints was noted. No intercarpal or radiocarpal joint space narrowing was noted. No erosive changes were noted. Impression: These findings are consistent with rheumatoid arthritis and osteoarthritis overlap.  Xr Hand 2 View Right  Result Date: 04/23/2017  Juxta articular osteopenia  Noted mild narrowing of the  First,second and third MCP joint and all PIP joints was noted. No intercarpal or radiocarpal joint space narrowing was noted. No erosive changes were noted. Impression: These findings are consistent with rheumatoid arthritis and osteoarthritis overlap.   Speciality Comments: No specialty comments  available.    Procedures:  No procedures performed Allergies: Sulfa antibiotics   Assessment / Plan:     Visit Diagnoses: Rheumatoid arthritis involving multiple sites with positive rheumatoid factor (HCC): He is doing much better on current combination of medication. It minimal synovitis on examination today.  High risk medication use - Simponi Subq Monthly and Arava 10 mg by mouth daily ( SULFA, METHOTREXATE caused elevated creat IMURAN elevated LFTs  He had inadeq, resp. to Anthony, HUMIRA, ORENCIA, XELJANZ and SIMPONI ARIA) his labs have been stable. He will need labs every 3 months to monitor for drug toxicity.  Pain in both hands - Plan: XR Hand 2 View Right, XR Hand 2 View Left: x-rays revealed some osteopenia and MCP joint narrowing or erosions. He has mild synovitis in this left wrist and left fifth MCP joint. He states he usually develops mild symptoms prior to next injection.  Pain in both feet - Plan: XR Foot 2 Views Right, XR Foot 2 Views Left:  X-rays revealed osteopenia MTP joint narrowing and erosive changes.Some stiffness in his feet. Left heel has been painful.  Vitiligo  History of gastroesophageal reflux (GERD)    Orders: Orders Placed This Encounter  Procedures  . XR Hand 2 View Right  . XR  Hand 2 View Left  . XR Foot 2 Views Right  . XR Foot 2 Views Left   No orders of the defined types were placed in this encounter.   Face-to-face time spent with patient was  minutes.  Greater than 50% of time was spent in counseling and coordination of care.  Follow-Up Instructions: Return in about 5 months (around 09/23/2017) for Rheumatoid arthritis.   Pollyann Savoy, MD  Note - This record has been created using Animal nutritionist.  Chart creation errors have been sought, but may not always  have been located. Such creation errors do not reflect on  the standard of medical care.

## 2017-04-23 ENCOUNTER — Ambulatory Visit (INDEPENDENT_AMBULATORY_CARE_PROVIDER_SITE_OTHER): Payer: Commercial Managed Care - PPO | Admitting: Rheumatology

## 2017-04-23 ENCOUNTER — Ambulatory Visit (INDEPENDENT_AMBULATORY_CARE_PROVIDER_SITE_OTHER): Payer: Self-pay

## 2017-04-23 ENCOUNTER — Encounter: Payer: Self-pay | Admitting: Rheumatology

## 2017-04-23 VITALS — BP 120/78 | HR 78 | Resp 14 | Ht 71.0 in | Wt 186.0 lb

## 2017-04-23 DIAGNOSIS — M0579 Rheumatoid arthritis with rheumatoid factor of multiple sites without organ or systems involvement: Secondary | ICD-10-CM | POA: Diagnosis not present

## 2017-04-23 DIAGNOSIS — M79671 Pain in right foot: Secondary | ICD-10-CM | POA: Diagnosis not present

## 2017-04-23 DIAGNOSIS — M79642 Pain in left hand: Secondary | ICD-10-CM | POA: Diagnosis not present

## 2017-04-23 DIAGNOSIS — Z79899 Other long term (current) drug therapy: Secondary | ICD-10-CM | POA: Diagnosis not present

## 2017-04-23 DIAGNOSIS — L80 Vitiligo: Secondary | ICD-10-CM

## 2017-04-23 DIAGNOSIS — M79641 Pain in right hand: Secondary | ICD-10-CM | POA: Diagnosis not present

## 2017-04-23 DIAGNOSIS — Z8719 Personal history of other diseases of the digestive system: Secondary | ICD-10-CM

## 2017-04-23 DIAGNOSIS — M79672 Pain in left foot: Secondary | ICD-10-CM | POA: Diagnosis not present

## 2017-04-23 NOTE — Patient Instructions (Addendum)
Standing Labs We placed an order today for your standing lab work.    Please come back and get your standing labs in  November and every 3 months  We have open lab Monday through Friday from 8:30-11:30 AM and 1:30-4 PM at the office of Dr. Pollyann Savoy.   The office is located at [address], [place], [place] [address] No appointment is necessary.   Labs are drawn by [facility].  You may receive a bill from [facility] for your lab work. If you have any questions regarding directions or hours of operation,  please call [phone].    Plantar Fasciitis Rehab Ask your health care provider which exercises are safe for you. Do exercises exactly as told by your health care provider and adjust them as directed. It is normal to feel mild stretching, pulling, tightness, or discomfort as you do these exercises, but you should stop right away if you feel sudden pain or your pain gets worse. Do not begin these exercises until told by your health care provider. Stretching and range of motion exercises These exercises warm up your muscles and joints and improve the movement and flexibility of your foot. These exercises also help to relieve pain. Exercise A: Plantar fascia stretch  1. Sit with your left / right leg crossed over your opposite knee. 2. Hold your heel with one hand with that thumb near your arch. With your other hand, hold your toes and gently pull them back toward the top of your foot. You should feel a stretch on the bottom of your toes or your foot or both. 3. Hold this stretch for__________ seconds. 4. Slowly release your toes and return to the starting position. Repeat __________ times. Complete this exercise __________ times a day. Exercise B: Gastroc, standing  1. Stand with your hands against a wall. 2. Extend your left / right leg behind you, and bend your front knee slightly. 3. Keeping your heels on the floor and keeping your back knee straight, shift your weight  toward the wall without arching your back. You should feel a gentle stretch in your left / right calf. 4. Hold this position for __________ seconds. Repeat __________ times. Complete this exercise __________ times a day. Exercise C: Soleus, standing 1. Stand with your hands against a wall. 2. Extend your left / right leg behind you, and bend your front knee slightly. 3. Keeping your heels on the floor, bend your back knee and slightly shift your weight over the back leg. You should feel a gentle stretch deep in your calf. 4. Hold this position for __________ seconds. Repeat __________ times. Complete this exercise __________ times a day. Exercise D: Gastrocsoleus, standing 1. Stand with the ball of your left / right foot on a step. The ball of your foot is on the walking surface, right under your toes. 2. Keep your other foot firmly on the same step. 3. Hold onto the wall or a railing for balance. 4. Slowly lift your other foot, allowing your body weight to press your heel down over the edge of the step. You should feel a stretch in your left / right calf. 5. Hold this position for __________ seconds. 6. Return both feet to the step. 7. Repeat this exercise with a slight bend in your left / right knee. Repeat __________ times with your left / right knee straight and __________ times with your left / right knee bent. Complete this exercise __________ times a day. Balance exercise This  exercise builds your balance and strength control of your arch to help take pressure off your plantar fascia. Exercise E: Single leg stand 1. Without shoes, stand near a railing or in a doorway. You may hold onto the railing or door frame as needed. 2. Stand on your left / right foot. Keep your big toe down on the floor and try to keep your arch lifted. Do not let your foot roll inward. 3. Hold this position for __________ seconds. 4. If this exercise is too easy, you can try it with your eyes closed or while  standing on a pillow. Repeat __________ times. Complete this exercise __________ times a day. This information is not intended to replace advice given to you by your health care provider. Make sure you discuss any questions you have with your health care provider. Document Released: 08/06/2005 Document Revised: 04/10/2016 Document Reviewed: 06/20/2015 Elsevier Interactive Patient Education  2018 [employer].

## 2017-04-25 ENCOUNTER — Other Ambulatory Visit: Payer: Self-pay | Admitting: Radiology

## 2017-04-25 NOTE — Telephone Encounter (Signed)
ok 

## 2017-04-25 NOTE — Telephone Encounter (Signed)
Last Visit: 04/23/17 Next Visit: 10/01/17 Labs: 8/31/18Creat 1.33 Previously 1.15 TB Gold: 06/29/16 Neg  Rheumatoid arthritis involving multiple sites with positive rheumatoid factor (HCC): Also on Arava  Okay to refill Simponi?

## 2017-04-25 NOTE — Telephone Encounter (Signed)
Refill request received via fax for Simponi from [facility]  

## 2017-04-26 MED ORDER — GOLIMUMAB 50 MG/0.5ML ~~LOC~~ SOSY: PREFILLED_SYRINGE | SUBCUTANEOUS | 0 refills | Status: DC

## 2017-04-29 ENCOUNTER — Telehealth: Payer: Self-pay | Admitting: Rheumatology

## 2017-04-29 MED ORDER — GOLIMUMAB 50 MG/0.5ML ~~LOC~~ SOSY: PREFILLED_SYRINGE | SUBCUTANEOUS | 0 refills | Status: DC

## 2017-04-29 NOTE — Telephone Encounter (Signed)
Patient advised prescription has been sent to [facility].

## 2017-04-29 NOTE — Telephone Encounter (Signed)
Patient calling stating Simponi rx needed to be sent into [facility], not [facility]. Please send meds in ASAP. Patient due for dose tomorrow.

## 2017-05-07 ENCOUNTER — Other Ambulatory Visit: Payer: Self-pay | Admitting: *Deleted

## 2017-05-07 MED ORDER — LEFLUNOMIDE 10 MG PO TABS: 10.0000 mg | ORAL_TABLET | Freq: Every day | ORAL | 0 refills | Status: DC

## 2017-05-07 NOTE — Telephone Encounter (Signed)
Refill request received via fax for Arava  Last Visit: 04/23/17 Next Visit: 10/01/17 Labs: 8/31/18Creat 1.33 Previously 1.15  Okay to refill per Dr. Corliss Skains

## 2017-05-26 ENCOUNTER — Other Ambulatory Visit: Payer: Self-pay | Admitting: Rheumatology

## 2017-05-27 NOTE — Telephone Encounter (Signed)
Last Visit: 04/23/17 Next Visit: 10/01/17  Okay to refill per Dr. Corliss Skains

## 2017-07-07 ENCOUNTER — Other Ambulatory Visit: Payer: Self-pay | Admitting: Rheumatology

## 2017-07-07 DIAGNOSIS — Z9225 Personal history of immunosupression therapy: Secondary | ICD-10-CM

## 2017-07-08 NOTE — Telephone Encounter (Signed)
Last Visit: 04/23/17 Next Visit: 10/01/17 Labs: 04/19/17 Stable Tb Gold: 06/29/16 Neg   Okay to refill per Dr. Corliss Skains

## 2017-08-02 ENCOUNTER — Ambulatory Visit: Payer: Commercial Managed Care - PPO | Admitting: Rheumatology

## 2017-08-02 ENCOUNTER — Encounter: Payer: Self-pay | Admitting: Rheumatology

## 2017-08-02 VITALS — BP 138/88 | HR 72

## 2017-08-02 DIAGNOSIS — M722 Plantar fascial fibromatosis: Secondary | ICD-10-CM

## 2017-08-02 MED ORDER — TRIAMCINOLONE ACETONIDE 40 MG/ML IJ SUSP
20.0000 mg | INTRAMUSCULAR | Status: AC | PRN
  Administered 2017-08-02: 20 mg

## 2017-08-02 MED ORDER — LIDOCAINE HCL 1 % IJ SOLN
0.5000 mL | INTRAMUSCULAR | Status: AC | PRN
  Administered 2017-08-02: .5 mL

## 2017-08-02 NOTE — Progress Notes (Signed)
   Procedure Note  Patient: Timothy Waters             Date of Birth: 1960/11/03           MRN: [id number]             Visit Date: 08/02/2017  Procedures: Visit Diagnoses: Plantar fasciitis of left foot  Foot Inj Date/Time: 08/02/2017 12:08 PM Performed by: Pollyann Savoy, MD Authorized by: Pollyann Savoy, MD   Consent Given by:  Patient Site marked: the procedure site was marked   Timeout: prior to procedure the correct patient, procedure, and site was verified   Indications:  Fasciitis Condition: Plantar Fasciitis   Location: left plantar fascia muscle   Prep: patient was prepped and draped in usual sterile fashion   Needle Size:  27 G Medications:  0.5 mL lidocaine 1 %; 20 mg triamcinolone acetonide 40 MG/ML Patient Tolerance:  Patient tolerated the procedure well with no immediate complications     Pollyann Savoy, MD

## 2017-08-14 ENCOUNTER — Other Ambulatory Visit: Payer: Self-pay | Admitting: Rheumatology

## 2017-08-14 NOTE — Telephone Encounter (Signed)
Last Visit: 08/02/17 Next Visit: 11/07/17  Okay to refill per Dr. Deveshwar 

## 2017-09-26 ENCOUNTER — Other Ambulatory Visit: Payer: Self-pay | Admitting: Rheumatology

## 2017-09-27 NOTE — Telephone Encounter (Signed)
Last Visit: 08/02/17 Next Visit: 11/07/17 Labs: 07/08/17 WNL  Okay to refill per Dr. Corliss Skains

## 2017-10-01 ENCOUNTER — Ambulatory Visit: Payer: Commercial Managed Care - PPO | Admitting: Rheumatology

## 2017-10-03 ENCOUNTER — Ambulatory Visit: Payer: Commercial Managed Care - PPO | Admitting: Rheumatology

## 2017-10-05 ENCOUNTER — Other Ambulatory Visit: Payer: Self-pay | Admitting: Rheumatology

## 2017-10-07 NOTE — Telephone Encounter (Signed)
Last Visit: 08/02/17 Next Visit: 11/07/17 Labs: 07/08/17 WNL TB Gold: 08/12/17  Okay to refill per Dr. Corliss Skains

## 2017-10-24 NOTE — Progress Notes (Signed)
Office Visit Note  Patient: Timothy Waters             Date of Birth: 13-Jul-1961           MRN: [id number]             PCP: Lonie Peak, PA-C Referring: No ref. provider found Visit Date: 11/07/2017 Occupation: @GUAROCC @    Subjective:  Other (trigger finger )   History of Present Illness: Timothy Waters is a 57 y.o. male with history of rheumatoid arthritis.  He has been on Simponi subcu and Arava combination.  He has been doing really well on the combination therapy without any joint swelling.  He had trigger finger in the right middle digit.  Which is triggering again.  He would like to have cortisone injection.  Having some discomfort in the neck especially in the right trapezius area.  His plantar fasciitis in the left foot has improved but not completely resolved.  Activities of Daily Living:  Patient reports morning stiffness for 0 minute.   Patient Denies nocturnal pain.  Difficulty dressing/grooming: Denies Difficulty climbing stairs: Denies Difficulty getting out of chair: Denies Difficulty using hands for taps, buttons, cutlery, and/or writing: Reports   Review of Systems  Constitutional: Negative for fatigue and night sweats.  HENT: Negative for mouth sores, mouth dryness and nose dryness.   Eyes: Negative for redness and dryness.  Respiratory: Negative for shortness of breath and difficulty breathing.   Cardiovascular: Negative for chest pain, palpitations, hypertension, irregular heartbeat and swelling in legs/feet.  Gastrointestinal: Negative for constipation and diarrhea.  Endocrine: Negative for increased urination.  Musculoskeletal: Negative for arthralgias, joint pain, joint swelling, myalgias, muscle weakness, morning stiffness, muscle tenderness and myalgias.  Skin: Negative for color change, rash, hair loss, nodules/bumps, skin tightness, ulcers and sensitivity to sunlight.  Allergic/Immunologic: Negative for susceptible to infections.  Neurological:  Negative for dizziness, fainting, memory loss, night sweats and weakness ( ).  Hematological: Negative for swollen glands.  Psychiatric/Behavioral: Negative for depressed mood and sleep disturbance. The patient is not nervous/anxious.     PMFS History:  Patient Active Problem List   Diagnosis Date Noted  . History of gastroesophageal reflux (GERD) 01/01/2017  . Rheumatoid arthritis involving multiple sites with positive rheumatoid factor (HCC) 11/08/2016  . High risk medication use 11/08/2016  . Vitiligo 11/08/2016    Past Medical History:  Diagnosis Date  . Allergy   . Arthritis     Family History  Problem Relation Age of Onset  . Diabetes Mother   . Cancer Mother   . COPD Father   . Diabetes Father    Past Surgical History:  Procedure Laterality Date  . CARPAL TUNNEL RELEASE Left 2002  . MANDIBLE SURGERY  1994   Social History   Social History Narrative  . Not on file     Objective: Vital Signs: BP 128/82 (BP Location: Left Arm, Patient Position: Sitting, Cuff Size: Normal)   Pulse 69   Resp 15   Ht 5\' 11"  (1.803 m)   Wt 185 lb (83.9 kg)   BMI 25.80 kg/m    Physical Exam  Constitutional: He is oriented to person, place, and time. He appears well-developed and well-nourished.  HENT:  Head: Normocephalic and atraumatic.  Eyes: Pupils are equal, round, and reactive to light. Conjunctivae and EOM are normal.  Neck: Normal range of motion. Neck supple.  Cardiovascular: Normal rate, regular rhythm and normal heart sounds.  Pulmonary/Chest: Effort normal and breath sounds normal.  Abdominal: Soft. Bowel sounds are normal.  Neurological: He is alert and oriented to person, place, and time.  Skin: Skin is warm and dry. Capillary refill takes less than 2 seconds.  Vitiligo on hands and legs  Psychiatric: He has a normal mood and affect. His behavior is normal.  Nursing note and vitals reviewed.    Musculoskeletal Exam: C-spine thoracic lumbar spine good range of  motion.  Shoulder joints elbow joints wrist joint MCPs PIPs DIPs were in good range of motion.  He has some thickening over bilateral wrist joints.  No synovitis was noted.  Hip joints knee joints ankles MTPs PIPs are good range of motion.  CDAI Exam: No CDAI exam completed.    Investigation: No additional findings. Labs: 07/08/2017 WNL  TB Gold: 08/12/2017 Negative   November 01, 2017 CBC normal, CMP creatinine 1.32 which was slightly elevated. Imaging: No results found.  Speciality Comments: No specialty comments available.    Procedures:  No procedures performed Allergies: Sulfa antibiotics   Assessment / Plan:     Visit Diagnoses: Rheumatoid arthritis involving multiple sites with positive rheumatoid factor (HCC) -he is doing really well on combination of Simponi subcu and Arava.  He has no synovitis on examination today.  We will continue current combination for right now.  Simponi Subq, Arava(SULFA, METHOTREXATE caused elevated creat IMURAN elevated LFTs  He had inadeq, resp. to ENBREL, HUMIRA, ORENCIA, XELJANZ and SIMPONI ARIA)   High risk medication use: His labs have been stable.  His creatinine slightly elevated.  He states it could be because of not drinking enough water.  We will continue to monitor labs every 3 months.  Trigger middle finger of right hand: I will schedule a trigger finger ultrasound-guided injection.  A prescription for Voltaren gel was given which could be used over the trigger finger.  History of gastroesophageal reflux (GERD)  Vitiligo  Plantar fasciitis of left foot handout on plantar fasciitis exercises were given.  Orders: No orders of the defined types were placed in this encounter.  Meds ordered this encounter  Medications  . diclofenac sodium (VOLTAREN) 1 % GEL    Sig: Apply 3 gm to 3 large joints up to 3 times a day.Dispense 3 tubes with 3 refills.    Dispense:  3 Tube    Refill:  1    Face-to-face time spent with patient was 30  minutes.  Greater than 50% of time was spent in counseling and coordination of care.  Follow-Up Instructions: Return in about 5 months (around 04/09/2018) for Rheumatoid arthritis.   Pollyann Savoy, MD  Note - This record has been created using Animal nutritionist.  Chart creation errors have been sought, but may not always  have been located. Such creation errors do not reflect on  the standard of medical care.

## 2017-10-26 ENCOUNTER — Other Ambulatory Visit: Payer: Self-pay | Admitting: Rheumatology

## 2017-10-28 NOTE — Telephone Encounter (Signed)
Last Visit: 08/02/17 Next Visit: 11/07/17  Okay to refill per Dr. Corliss Skains

## 2017-10-31 ENCOUNTER — Telehealth: Payer: Self-pay | Admitting: Rheumatology

## 2017-10-31 NOTE — Telephone Encounter (Signed)
Monica called from [facility] patient's PCP office in [place] requesting orders for QF TB gold assay test.   The orders that they have on file are outdated.   Please fax to #[phone]

## 2017-11-01 ENCOUNTER — Other Ambulatory Visit: Payer: Self-pay | Admitting: *Deleted

## 2017-11-01 ENCOUNTER — Telehealth: Payer: Self-pay | Admitting: Rheumatology

## 2017-11-01 DIAGNOSIS — Z79899 Other long term (current) drug therapy: Secondary | ICD-10-CM

## 2017-11-01 NOTE — Telephone Encounter (Signed)
Patient does not need updated TB Gold at this time. Patient needs standing orders. Orders released and faxed.

## 2017-11-01 NOTE — Telephone Encounter (Signed)
Patient left a voicemail requesting a labwork order be faxed to [facility].  Patient has a 3:30 appointment today.  Please fax to #[phone]

## 2017-11-01 NOTE — Telephone Encounter (Signed)
Lab orders released and faxed.  

## 2017-11-04 ENCOUNTER — Other Ambulatory Visit: Payer: Self-pay | Admitting: Rheumatology

## 2017-11-04 NOTE — Telephone Encounter (Signed)
Last Visit: 08/02/17 Next Visit: 11/07/17 Labs: 07/08/17 WNL  Okay to refill 30 day supply per Dr. Corliss Skains

## 2017-11-07 ENCOUNTER — Telehealth: Payer: Self-pay | Admitting: Rheumatology

## 2017-11-07 ENCOUNTER — Ambulatory Visit: Payer: Commercial Managed Care - PPO | Admitting: Rheumatology

## 2017-11-07 ENCOUNTER — Encounter: Payer: Self-pay | Admitting: Rheumatology

## 2017-11-07 VITALS — BP 128/82 | HR 69 | Resp 15 | Ht 71.0 in | Wt 185.0 lb

## 2017-11-07 DIAGNOSIS — Z8719 Personal history of other diseases of the digestive system: Secondary | ICD-10-CM

## 2017-11-07 DIAGNOSIS — M0579 Rheumatoid arthritis with rheumatoid factor of multiple sites without organ or systems involvement: Secondary | ICD-10-CM

## 2017-11-07 DIAGNOSIS — M722 Plantar fascial fibromatosis: Secondary | ICD-10-CM | POA: Diagnosis not present

## 2017-11-07 DIAGNOSIS — L80 Vitiligo: Secondary | ICD-10-CM

## 2017-11-07 DIAGNOSIS — M65331 Trigger finger, right middle finger: Secondary | ICD-10-CM

## 2017-11-07 DIAGNOSIS — Z79899 Other long term (current) drug therapy: Secondary | ICD-10-CM | POA: Diagnosis not present

## 2017-11-07 MED ORDER — DICLOFENAC SODIUM 1 % TD GEL: TRANSDERMAL | 1 refills | Status: DC

## 2017-11-07 NOTE — Patient Instructions (Signed)

## 2017-11-07 NOTE — Telephone Encounter (Signed)
Patient states he would like the prescription of [facility]iclofenac [facility] [place] so he can pick it up this evening or tomorrow morning.

## 2017-11-08 MED ORDER — DICLOFENAC SODIUM 1 % TD GEL: TRANSDERMAL | 1 refills | Status: DC

## 2017-11-08 NOTE — Telephone Encounter (Signed)
Last Visit: 11/08/17 Next Visit: 12/04/17  Okay to refill per Dr. Corliss Skains

## 2017-12-04 ENCOUNTER — Ambulatory Visit: Payer: Commercial Managed Care - PPO | Admitting: Rheumatology

## 2017-12-04 DIAGNOSIS — M65321 Trigger finger, right index finger: Secondary | ICD-10-CM | POA: Diagnosis not present

## 2017-12-04 MED ORDER — LIDOCAINE HCL 1 % IJ SOLN
0.5000 mL | INTRAMUSCULAR | Status: AC | PRN
  Administered 2017-12-04: .5 mL

## 2017-12-04 MED ORDER — TRIAMCINOLONE ACETONIDE 40 MG/ML IJ SUSP
10.0000 mg | INTRAMUSCULAR | Status: AC | PRN
  Administered 2017-12-04: 10 mg

## 2017-12-04 NOTE — Progress Notes (Signed)
   Procedure Note  Patient: Ramzi Brathwaite             Date of Birth: 1961/08/06           MRN: [id number]             Visit Date: 12/04/2017  Procedures: Visit Diagnoses: Right middle trigger finger  Hand/UE Inj: R long A1 for trigger finger on 12/04/2017 4:10 PM Indications: pain, tendon swelling and therapeutic Details: 27 G needle, ultrasound-guided volar approach Medications: 0.5 mL lidocaine 1 %; 10 mg triamcinolone acetonide 40 MG/ML Aspirate: 0 mL Procedure, treatment alternatives, risks and benefits explained, specific risks discussed. Immediately prior to procedure a time out was called to verify the correct patient, procedure, equipment, support staff and site/side marked as required. Patient was prepped and draped in the usual sterile fashion.    Splint was applied.  Postinjection precautions were discussed. Pollyann Savoy, MD

## 2017-12-24 ENCOUNTER — Other Ambulatory Visit: Payer: Self-pay | Admitting: Rheumatology

## 2017-12-24 NOTE — Telephone Encounter (Signed)
Last Visit:11/07/17 Next visit: 04/10/18 Labs: 11/01/17 WNL  Okay to refill per Dr. Corliss Skains

## 2017-12-30 ENCOUNTER — Other Ambulatory Visit: Payer: Self-pay | Admitting: Rheumatology

## 2017-12-30 NOTE — Telephone Encounter (Signed)
Last Visit: 11/07/17 Next Visit: 04/10/18 Labs: 11/01/17 WNL TB Gold: 08/12/17 Neg   Okay to refill per Dr. Corliss Skains

## 2018-02-06 ENCOUNTER — Telehealth: Payer: Self-pay | Admitting: Rheumatology

## 2018-02-06 NOTE — Telephone Encounter (Signed)
Patient left a voicemail requesting prescription refill of Simponi 50 mg to be sent to [facility].

## 2018-02-07 MED ORDER — GOLIMUMAB 50 MG/0.5ML ~~LOC~~ SOSY: PREFILLED_SYRINGE | SUBCUTANEOUS | 0 refills | Status: DC

## 2018-02-07 NOTE — Telephone Encounter (Signed)
Last Visit: 11/07/17 Next Visit: 04/10/18 Labs: 11/01/17 WNL TB Gold: 08/12/17 Neg   Patient advised he is due for labs and will update beginning of next week.    Okay to refill per Dr. Corliss Skains

## 2018-02-17 ENCOUNTER — Telehealth: Payer: Self-pay | Admitting: Rheumatology

## 2018-02-17 DIAGNOSIS — Z79899 Other long term (current) drug therapy: Secondary | ICD-10-CM

## 2018-02-17 NOTE — Telephone Encounter (Signed)
Patient left a voicemail requesting his labwork orders be [facility] [place].

## 2018-02-17 NOTE — Telephone Encounter (Signed)
Orders released and faxed 

## 2018-02-28 ENCOUNTER — Telehealth: Payer: Self-pay | Admitting: Pharmacy Technician

## 2018-02-28 NOTE — Telephone Encounter (Signed)
Received a Prior Authorization request from [facility] for Simponi. Authorization has been submitted to patient's insurance via FAX. Will update once we receive a response.  Will send document to scan center.  9:49 AM  Dorthula Nettles, CPhT

## 2018-03-20 ENCOUNTER — Other Ambulatory Visit: Payer: Self-pay | Admitting: Rheumatology

## 2018-03-21 NOTE — Telephone Encounter (Signed)
Last visit: 11/07/2017 Next visit: 04/10/2018  Okay to refill per Dr. Deveshwar 

## 2018-03-26 ENCOUNTER — Other Ambulatory Visit: Payer: Self-pay | Admitting: Rheumatology

## 2018-03-26 NOTE — Telephone Encounter (Signed)
Last visit: 11/07/2017 Next visit: 04/10/2018 Labs: 02/21/18 WNL  Okay to refill per Dr. Corliss Skains.

## 2018-03-27 NOTE — Progress Notes (Signed)
Office Visit Note  Patient: Timothy Waters             Date of Birth: 1961/04/08           MRN: [id number]             PCP: Lonie Peak, PA-C Referring: Lonie Peak, PA-C Visit Date: 04/10/2018 Occupation: @GUAROCC @  Subjective:  Neck discomfort   History of Present Illness: Timothy Waters is a 57 y.o. male with history of seropositive rheumatoid arthritis.  He is on Simponi sq injections and takes Arava 10 mg po daily. He has not missed any doses recently.  He reports that this combination of medications has been controlling his RA.  He denies any recent flares. He reports that since eliminating sugar, red meat, and tomatoes he has felt considerably better. He has occasional neck discomfort but denies any symptoms of radiculopathy.  He denies any other joint pain or joint swelling. He denies any joint stiffness.  He states that 24 hours after simponi injections he develops a sore throat for 1 day, which resolves on its own.  He denies any associated symptoms or fevers.     Activities of Daily Living:  Patient reports morning stiffness for 0 minutes.   Patient Denies nocturnal pain.  Difficulty dressing/grooming: Denies Difficulty climbing stairs: Denies Difficulty getting out of chair: Denies Difficulty using hands for taps, buttons, cutlery, and/or writing: Denies  Review of Systems  Constitutional: Negative for fatigue and night sweats.  HENT: Negative for mouth sores, trouble swallowing, trouble swallowing, mouth dryness and nose dryness.   Eyes: Negative for redness, itching, visual disturbance and dryness.  Respiratory: Negative for cough, hemoptysis, shortness of breath and difficulty breathing.   Cardiovascular: Negative for chest pain, palpitations, hypertension, irregular heartbeat and swelling in legs/feet.  Gastrointestinal: Negative for abdominal pain, constipation, diarrhea, nausea and vomiting.  Endocrine: Negative for increased urination.  Genitourinary: Negative  for painful urination, pelvic pain and urgency.  Musculoskeletal: Negative for arthralgias, joint pain, joint swelling, myalgias, muscle weakness, morning stiffness, muscle tenderness and myalgias.  Skin: Negative for color change, rash, hair loss, nodules/bumps, skin tightness, ulcers and sensitivity to sunlight.  Allergic/Immunologic: Negative for susceptible to infections.  Neurological: Negative for dizziness, fainting, light-headedness, headaches, memory loss, night sweats and weakness.  Hematological: Negative for swollen glands.  Psychiatric/Behavioral: Negative for depressed mood, confusion and sleep disturbance. The patient is not nervous/anxious.     PMFS History:  Patient Active Problem List   Diagnosis Date Noted  . History of gastroesophageal reflux (GERD) 01/01/2017  . Rheumatoid arthritis involving multiple sites with positive rheumatoid factor (HCC) 11/08/2016  . High risk medication use 11/08/2016  . Vitiligo 11/08/2016    Past Medical History:  Diagnosis Date  . Allergy   . Arthritis     Family History  Problem Relation Age of Onset  . Diabetes Mother   . Cancer Mother   . COPD Father   . Diabetes Father    Past Surgical History:  Procedure Laterality Date  . CARPAL TUNNEL RELEASE Left 2002  . MANDIBLE SURGERY  1994   Social History   Social History Narrative  . Not on file    Objective: Vital Signs: BP 124/84 (BP Location: Left Arm, Patient Position: Sitting, Cuff Size: Normal)   Pulse 76   Resp 13   Ht 5\' 11"  (1.803 m)   Wt 187 lb 3.2 oz (84.9 kg)   BMI 26.11 kg/m    Physical Exam  Constitutional: He is  oriented to person, place, and time. He appears well-developed and well-nourished.  HENT:  Head: Normocephalic and atraumatic.  Eyes: Pupils are equal, round, and reactive to light. Conjunctivae and EOM are normal.  Neck: Normal range of motion. Neck supple.  Cardiovascular: Normal rate, regular rhythm and normal heart sounds.    Pulmonary/Chest: Effort normal and breath sounds normal.  Abdominal: Soft. Bowel sounds are normal.  Lymphadenopathy:    He has no cervical adenopathy.  Neurological: He is alert and oriented to person, place, and time.  Skin: Skin is warm and dry. Capillary refill takes less than 2 seconds.  Psychiatric: He has a normal mood and affect. His behavior is normal.  Nursing note and vitals reviewed.    Musculoskeletal Exam: C-spine slightly limited lateral rotation.  Thoracic and lumbar spine good ROM. No midline spinal tenderness.  No SI joint tenderness.  Shoulder joints, elbow joints, wrist joints, MCPs, PIPs, and DIPs good ROM with no synovitis.  Complete fist formation bilaterally.  Hip joints, knee joints, ankle joints, MTPs, PIPs, and DIPs good ROM with no synovitis.  No warmth or effusion of knee joints.  Bilateral knee joint crepitus.  No tenderness of trochanteric bursa bilaterally.   CDAI Exam: CDAI Score: 0.4  Patient Global Assessment: 2 (mm); Provider Global Assessment: 2 (mm) Swollen: 0 ; Tender: 0  Joint Exam   Not documented   There is currently no information documented on the homunculus. Go to the Rheumatology activity and complete the homunculus joint exam.  Investigation: No additional findings.  Imaging: No results found.  Recent Labs: No results found for: WBC, HGB, PLT, NA, K, CL, CO2, GLUCOSE, BUN, CREATININE, BILITOT, ALKPHOS, AST, ALT, PROT, ALBUMIN, CALCIUM, GFRAA, QFTBGOLD, QFTBGOLDPLUS  Speciality Comments: No specialty comments available.  Procedures:  No procedures performed Allergies: Sulfa antibiotics   Assessment / Plan:     Visit Diagnoses: Rheumatoid arthritis involving multiple sites with positive rheumatoid factor (HCC): He has no synovitis. He has not had any recent rheumatoid arthritis flares. He has occasional neck pain but no symptoms of radiculopathy. He has slightly limited lateral rotation on exam.  He has not other joint pain or joint  swelling at this time.  He has no joint stiffness.  He is clinically doing well on Arava 10 mg po daily and Simponi sq injections.  He does not need any refills at this time. He was encouraged to continue to avoid inflammatory inducing foods.  He also plans on continuing to take Tumeric.  He was advised to notify us if he develops increased joint pain or joint swelling. He will follow up in 5 months.     High risk medication use - Simponi sq inj once monthly, Arava (d/c SSZ, MTX-elevated creat, Imuran-elevated LFTs, inadequate response to Enbrel, Humira, Orenica,Xeljanz, Simponi Aria).  CBC and CMP were WNL on 02/21/18.  He will return in October and every 3 months for lab work to monitor for drug toxicity.   Other medical conditions are listed as follows:   History of gastroesophageal reflux (GERD)  Vitiligo  Plantar fasciitis: Resolved   Orders: No orders of the defined types were placed in this encounter.  No orders of the defined types were placed in this encounter.    Follow-Up Instructions: Return in about 5 months (around 09/10/2018) for Rheumatoid arthritis.   Gearldine Bienenstock, PA-C  Note - This record has been created using Dragon software.  Chart creation errors have been sought, but may not always  have been  located. Such creation errors do not reflect on  the standard of medical care.

## 2018-04-09 NOTE — Telephone Encounter (Signed)
Received a fax from [facility] regarding a prior authorization for Simponi. Authorization has been APPROVED from 02/28/2018 to 02/28/2019. Must be filled at [facility].  Will send document to scan center.  Authorization # [id number] Phone # [phone]  2:57 PM Jasalyn Frysinger Johnney Ou, CPhT

## 2018-04-10 ENCOUNTER — Ambulatory Visit: Payer: Commercial Managed Care - PPO | Admitting: Physician Assistant

## 2018-04-10 ENCOUNTER — Encounter: Payer: Self-pay | Admitting: Physician Assistant

## 2018-04-10 VITALS — BP 124/84 | HR 76 | Resp 13 | Ht 71.0 in | Wt 187.2 lb

## 2018-04-10 DIAGNOSIS — M0579 Rheumatoid arthritis with rheumatoid factor of multiple sites without organ or systems involvement: Secondary | ICD-10-CM | POA: Diagnosis not present

## 2018-04-10 DIAGNOSIS — Z79899 Other long term (current) drug therapy: Secondary | ICD-10-CM | POA: Diagnosis not present

## 2018-04-10 DIAGNOSIS — L80 Vitiligo: Secondary | ICD-10-CM | POA: Diagnosis not present

## 2018-04-10 DIAGNOSIS — Z8719 Personal history of other diseases of the digestive system: Secondary | ICD-10-CM | POA: Diagnosis not present

## 2018-04-10 DIAGNOSIS — M722 Plantar fascial fibromatosis: Secondary | ICD-10-CM

## 2018-04-10 NOTE — Patient Instructions (Signed)
Standing Labs We placed an order today for your standing lab work.   Please come back and get your standing labs in October and every 3 months   We have open lab Monday through Friday from 8:30-11:30 AM and 1:30-4:00 PM  at the office of Dr. Shaili Deveshwar.   You may experience shorter wait times on Monday and Friday afternoons. The office is located at [address], [address], [place], [place] [address] No appointment is necessary.   Labs are drawn by [facility].  You may receive a bill from [facility] for your lab work. If you have any questions regarding directions or hours of operation,  please call [phone].    

## 2018-04-25 ENCOUNTER — Encounter: Payer: Self-pay | Admitting: Physician Assistant

## 2018-04-29 ENCOUNTER — Other Ambulatory Visit: Payer: Self-pay | Admitting: Rheumatology

## 2018-04-30 NOTE — Telephone Encounter (Signed)
Last visit: 04/10/18 Next Visit: 09/10/18 Labs: 02/17/18 WNL  Okay to refill per Dr. Corliss Skains

## 2018-05-02 ENCOUNTER — Telehealth: Payer: Self-pay | Admitting: *Deleted

## 2018-05-02 NOTE — Telephone Encounter (Signed)
Received lab results which were drawn on 04/25/18. Reviewed by Dr. Corliss Skains Creat 1.33, previous 1.27 Elevated cholesterol panel. Patient on Simponi and Arava. Per Dr. Corliss Skains will monitor and patient to avoid NSAIDS. Advised patient of results and recommendations. Patient verbalized understanding.

## 2018-05-20 ENCOUNTER — Other Ambulatory Visit: Payer: Self-pay | Admitting: Rheumatology

## 2018-05-20 NOTE — Telephone Encounter (Signed)
Last visit: 04/10/18 Next Visit: 09/10/18 Labs: 04/25/18 Creat 1.33 previous 1.27 Will monitor. Avoid NSAIDS.  TB GOLD: 08/12/17 Neg   Okay to refill per Dr. Corliss Skains

## 2018-08-07 ENCOUNTER — Other Ambulatory Visit: Payer: Self-pay | Admitting: Rheumatology

## 2018-08-07 ENCOUNTER — Telehealth: Payer: Self-pay | Admitting: Rheumatology

## 2018-08-07 DIAGNOSIS — Z79899 Other long term (current) drug therapy: Secondary | ICD-10-CM

## 2018-08-07 DIAGNOSIS — Z9225 Personal history of immunosupression therapy: Secondary | ICD-10-CM

## 2018-08-07 MED ORDER — LEFLUNOMIDE 10 MG PO TABS: 10.0000 mg | ORAL_TABLET | Freq: Every day | ORAL | 0 refills | Status: DC

## 2018-08-07 NOTE — Telephone Encounter (Signed)
Lab Orders faxed

## 2018-08-07 NOTE — Telephone Encounter (Signed)
Patient called back with the fax number for [facility] to send his labwork orders.  Fax #[phone]

## 2018-08-07 NOTE — Telephone Encounter (Signed)
Last visit: 04/10/18 Next Visit: 09/10/18 Labs: 04/25/18 Creat. 1.33  TB Gold: 08/12/17 Neg   Patient advised he is due to update labs. Patient will update. Patient will go to [facility] to get his labs updated.   Okay to refill 30 day supply per Dr. Corliss Skains   Patient also requesting refill on [place]

## 2018-08-22 ENCOUNTER — Telehealth: Payer: Self-pay | Admitting: *Deleted

## 2018-08-22 NOTE — Telephone Encounter (Signed)
Received a fax from [facility] stating they have made several attempts to contact the patient to fill Simponi. Unable to reach the patient. Call back number [phone]

## 2018-08-22 NOTE — Telephone Encounter (Signed)
Lab results received and Creat elevated. Patient advised we will continue to monitor. Patient advised to all NSAIDS.

## 2018-08-27 NOTE — Progress Notes (Signed)
Office Visit Note  Patient: Timothy Waters             Date of Birth: 04/03/1961           MRN: [id number]             PCP: Lonie Peak, PA-C Referring: Lonie Peak, PA-C Visit Date: 09/10/2018 Occupation: @GUAROCC @  Subjective:  Right middle trigger finger    History of Present Illness: Timothy Waters is a 58 y.o. male with history of seropositive rheumatoid arthritis.  He continues to receive Simponi sq once monthly and Arava 10 mg po daily.  He has not missed any doses recently.  He denies any recent infections.  He received the influenza vaccine.  He denies any recent rheumatoid arthritis flares.  He reports his right middle trigger finger has started to lock up more frequently.  He is having some increased discomfort in the left shoulder.  He denies any joint swelling at this time.  He is due for his next Simponi injection next week.  He states he experiences some discomfort a few days prior to his next injection. He denies any plantar fasciitis.    Activities of Daily Living:  Patient reports morning stiffness for 0  minutes.   Patient Denies nocturnal pain.  Difficulty dressing/grooming: Denies Difficulty climbing stairs: Denies Difficulty getting out of chair: Denies Difficulty using hands for taps, buttons, cutlery, and/or writing: Denies  Review of Systems  Constitutional: Negative for fatigue and night sweats.  HENT: Negative for mouth sores, mouth dryness and nose dryness.   Eyes: Positive for dryness. Negative for pain, redness and visual disturbance.  Respiratory: Positive for cough (Using Proair). Negative for hemoptysis, shortness of breath and difficulty breathing.   Cardiovascular: Negative for chest pain, palpitations, hypertension, irregular heartbeat and swelling in legs/feet.  Gastrointestinal: Negative for blood in stool, constipation and diarrhea.  Endocrine: Negative for increased urination.  Genitourinary: Negative for painful urination.    Musculoskeletal: Positive for arthralgias and joint pain. Negative for joint swelling, myalgias, muscle weakness, morning stiffness, muscle tenderness and myalgias.  Skin: Negative for color change, rash, hair loss, nodules/bumps, skin tightness, ulcers and sensitivity to sunlight.  Allergic/Immunologic: Negative for susceptible to infections.  Neurological: Negative for dizziness, fainting, memory loss, night sweats and weakness.  Hematological: Negative for swollen glands.  Psychiatric/Behavioral: Negative for depressed mood and sleep disturbance. The patient is not nervous/anxious.     PMFS History:  Patient Active Problem List   Diagnosis Date Noted  . History of gastroesophageal reflux (GERD) 01/01/2017  . Rheumatoid arthritis involving multiple sites with positive rheumatoid factor (HCC) 11/08/2016  . High risk medication use 11/08/2016  . Vitiligo 11/08/2016    Past Medical History:  Diagnosis Date  . Allergy   . Arthritis     Family History  Problem Relation Age of Onset  . Diabetes Mother   . Cancer Mother   . COPD Father   . Diabetes Father    Past Surgical History:  Procedure Laterality Date  . CARPAL TUNNEL RELEASE Left 2002  . MANDIBLE SURGERY  1994   Social History   Social History Narrative  . Not on file    Objective: Vital Signs: BP 135/89 (BP Location: Left Arm, Patient Position: Sitting, Cuff Size: Normal)   Pulse 68   Resp 12   Ht 5\' 11"  (1.803 m)   Wt 197 lb (89.4 kg)   BMI 27.48 kg/m    Physical Exam Vitals signs and nursing note reviewed.  Constitutional:      Appearance: He is well-developed.  HENT:     Head: Normocephalic and atraumatic.  Eyes:     Conjunctiva/sclera: Conjunctivae normal.     Pupils: Pupils are equal, round, and reactive to light.  Neck:     Musculoskeletal: Normal range of motion and neck supple.  Cardiovascular:     Rate and Rhythm: Normal rate and regular rhythm.     Heart sounds: Normal heart sounds.   Pulmonary:     Effort: Pulmonary effort is normal.     Breath sounds: Normal breath sounds.  Abdominal:     General: Bowel sounds are normal.     Palpations: Abdomen is soft.  Lymphadenopathy:     Cervical: No cervical adenopathy.  Skin:    General: Skin is warm and dry.     Capillary Refill: Capillary refill takes less than 2 seconds.  Neurological:     Mental Status: He is alert and oriented to person, place, and time.  Psychiatric:        Behavior: Behavior normal.      Musculoskeletal Exam: C-spine, thoracic spine, spine good range of motion.  No midline spinal tenderness.  No SI joint tenderness.  Shoulder joints, elbow joints, wrist joints, MCPs, PIPs, DIPs good range of motion with no synovitis.  He has complete fist formation bilaterally.  Right middle trigger finger noted.  Hip joints, knee joints, ankle joints, MTPs, PIPs, DIPs good range of motion with no synovitis.  No warmth or effusion of bilateral knee joints.  She has bilateral knee crepitus.  No tenderness or swelling of ankle joints.  No tenderness over trochanteric bursa bilaterally.  CDAI Exam: CDAI Score: 0.2  Patient Global Assessment: 1 (mm); Provider Global Assessment: 1 (mm) Swollen: 0 ; Tender: 0  Joint Exam   Not documented   There is currently no information documented on the homunculus. Go to the Rheumatology activity and complete the homunculus joint exam.  Investigation: No additional findings.  Imaging: No results found.  Recent Labs: No results found for: WBC, HGB, PLT, NA, K, CL, CO2, GLUCOSE, BUN, CREATININE, BILITOT, ALKPHOS, AST, ALT, PROT, ALBUMIN, CALCIUM, GFRAA, QFTBGOLD, QFTBGOLDPLUS  Speciality Comments: No specialty comments available.  Procedures:  No procedures performed Allergies: Sulfa antibiotics   Assessment / Plan:     Visit Diagnoses: Rheumatoid arthritis involving multiple sites with positive rheumatoid factor (HCC): He has no synovitis or tenderness on exam. He has  not had any recent rheumatoid arthritis flares. He is clinically doing well on Simponi 50 mg sq once monthly and Arava 10 mg po daily. He is due for his next infusion next week.  He is having some discomfort in both shoulder joints, but he has good ROM with no warmth or effusion on exam.  He continues to avoid pro-inflammatory foods and follow a strict diet.  He tries to remain active.  He will continue on this current treatment regimen.  He does not need any refills at this time.  He was advised to notify us if he develops increased joint pain or joint swelling.  He will follow up in 5 months.    High risk medication use -Simponi 50 mg every month and Arava 10 mg daily.  Last TB gold negative on 08/11/2018.  Last CBC/CMP within normal limits except for elevated creatinine on 08/11/2018.  He has been evaluated by nephrology in the past.  He was advised to avoid NSAIDs.  Will monitor every 3 months and standing orders  placed. He had the annual influenza vaccination.  He has not had any recent infections.  (d/c SSZ, MTX-elevated creat, Imuran-elevated LFTs, inadequate response to Enbrel, Humira, [place],Xeljanz, Simponi Aria). - Plan: CBC with Differential/Platelet, COMPLETE METABOLIC PANEL WITH GFR  Plantar fasciitis - Resolved   Other medical conditions are listed as follows:   History of gastroesophageal reflux (GERD)  Vitiligo   Orders: Orders Placed This Encounter  Procedures  . CBC with Differential/Platelet  . COMPLETE METABOLIC PANEL WITH GFR   No orders of the defined types were placed in this encounter.    Follow-Up Instructions: Return in about 5 months (around 02/09/2019) for Rheumatoid arthritis.   Gearldine Bienenstockaylor M Supreme Rybarczyk, PA-C   I examined and evaluated the patient with Sherron Alesaylor Shray Hunley PA.  Patient had no synovitis on examination today.  He is generally well on combination of Simponi subcu and Arava.  The plan of care was discussed as noted above.  Pollyann SavoyShaili Deveshwar, MD  Note - This record  has been created using Animal nutritionistDragon software.  Chart creation errors have been sought, but may not always  have been located. Such creation errors do not reflect on  the standard of medical care.

## 2018-09-09 ENCOUNTER — Other Ambulatory Visit: Payer: Self-pay | Admitting: Rheumatology

## 2018-09-09 NOTE — Telephone Encounter (Signed)
Last visit: 04/10/18 Next Visit: 09/10/18 Labs: 08/19/18 Creat elevated. TB Gold: 08/19/18 Neg   Okay to refill per Dr. Corliss Skains

## 2018-09-10 ENCOUNTER — Encounter: Payer: Self-pay | Admitting: Rheumatology

## 2018-09-10 ENCOUNTER — Ambulatory Visit: Payer: Commercial Managed Care - PPO | Admitting: Rheumatology

## 2018-09-10 ENCOUNTER — Encounter (INDEPENDENT_AMBULATORY_CARE_PROVIDER_SITE_OTHER): Payer: Self-pay

## 2018-09-10 VITALS — BP 135/89 | HR 68 | Resp 12 | Ht 71.0 in | Wt 197.0 lb

## 2018-09-10 DIAGNOSIS — Z8719 Personal history of other diseases of the digestive system: Secondary | ICD-10-CM

## 2018-09-10 DIAGNOSIS — M722 Plantar fascial fibromatosis: Secondary | ICD-10-CM

## 2018-09-10 DIAGNOSIS — M0579 Rheumatoid arthritis with rheumatoid factor of multiple sites without organ or systems involvement: Secondary | ICD-10-CM

## 2018-09-10 DIAGNOSIS — R7989 Other specified abnormal findings of blood chemistry: Secondary | ICD-10-CM

## 2018-09-10 DIAGNOSIS — Z79899 Other long term (current) drug therapy: Secondary | ICD-10-CM | POA: Diagnosis not present

## 2018-09-10 DIAGNOSIS — L80 Vitiligo: Secondary | ICD-10-CM

## 2018-09-10 NOTE — Patient Instructions (Signed)
Standing Labs We placed an order today for your standing lab work.    Please come back and get your standing labs in March and every 3 months  We have open lab Monday through Friday from 8:30-11:30 AM and 1:30-4:00 PM  at the office of Dr. Shaili Deveshwar.   You may experience shorter wait times on Monday and Friday afternoons. The office is located at [address], [address], [place], [place] [address] No appointment is necessary.   Labs are drawn by [facility].  You may receive a bill from [facility] for your lab work.  If you wish to have your labs drawn at another location, please call the office 24 hours in advance to send orders.  If you have any questions regarding directions or hours of operation,  please call [phone].   Just as a reminder please drink plenty of water prior to coming for your lab work. Thanks!  

## 2018-09-11 ENCOUNTER — Telehealth: Payer: Self-pay | Admitting: Pharmacy Technician

## 2018-09-11 NOTE — Telephone Encounter (Signed)
Patient called in to state that [facility] told him he needed a new prior authorization. Patient's PA is active through 02/28/2019. Called [facility], and rep realized that they were billing the prescription incorrectly. She corrected it and patient can call to schedule shipment. Called patient, advised him and he will call back with any issues.

## 2018-09-16 ENCOUNTER — Other Ambulatory Visit: Payer: Self-pay | Admitting: Rheumatology

## 2018-09-17 NOTE — Telephone Encounter (Signed)
Last Visit: 09/10/18 Next visit: 02/17/19  Okay to refill per Dr. Corliss Skainseveshwar

## 2018-11-15 ENCOUNTER — Other Ambulatory Visit: Payer: Self-pay | Admitting: Rheumatology

## 2018-11-17 HISTORY — PX: APPENDECTOMY: SHX54

## 2018-11-17 NOTE — Telephone Encounter (Signed)
Last Visit: 09/10/18 Next visit: 02/17/19 Labs: 08/11/18 Creat. 1.42 TB Gold: 08/11/18 Neg   Okay to refill per Dr. Deveshwar 

## 2018-11-17 NOTE — Telephone Encounter (Signed)
Last Visit: 09/10/18 Next visit: 02/17/19 Labs: 08/11/18 Creat. 1.42 TB Gold: 08/11/18 Neg   Okay to refill per Dr. Corliss Skains

## 2018-11-22 ENCOUNTER — Other Ambulatory Visit: Payer: Self-pay | Admitting: Rheumatology

## 2018-11-24 NOTE — Telephone Encounter (Addendum)
Patient advised he due to update labs. Patient will update as soon as possible.

## 2018-11-25 ENCOUNTER — Telehealth: Payer: Self-pay | Admitting: Rheumatology

## 2018-11-25 NOTE — Telephone Encounter (Signed)
Patient advised that the prescription for the Arava was denied yesterday as it is too soon to be refilled. Patient advised the prescription was sent in for a 90 day supply. Patient recently had an emergent surgery to have his appendix removed. Patient advised to contact the surgeon for clearance to restart medication.

## 2018-11-25 NOTE — Telephone Encounter (Signed)
Patient called stating he received an email from [facility] informing him that "one of his medications" will not be sent.  Patient was transferred to [facility].

## 2019-01-07 ENCOUNTER — Telehealth: Payer: Self-pay | Admitting: Rheumatology

## 2019-01-07 ENCOUNTER — Other Ambulatory Visit: Payer: Self-pay | Admitting: *Deleted

## 2019-01-07 DIAGNOSIS — Z79899 Other long term (current) drug therapy: Secondary | ICD-10-CM

## 2019-01-07 NOTE — Telephone Encounter (Signed)
Patient called requesting labwork orders be sent to [facility] in [place].  Patient states he plans to go tomorrow 01/09/19  if he is able to make an appointment.

## 2019-01-07 NOTE — Telephone Encounter (Signed)
Lab orders faxed.

## 2019-01-23 ENCOUNTER — Other Ambulatory Visit: Payer: Self-pay | Admitting: Rheumatology

## 2019-02-04 NOTE — Progress Notes (Signed)
Office Visit Note  Patient: Timothy Waters             Date of Birth: Oct 23, 1960           MRN: [id number]             PCP: Lonie Peak, PA-C Referring: Lonie Peak, PA-C Visit Date: 02/17/2019 Occupation: @GUAROCC @  Subjective:  Medication monitoring     History of Present Illness: Val Schiavo is a 58 y.o. male with history of seropositive rheumatoid arthritis.  He is on Simponi 50 mg sq every 28 days and Arava 10 mg 1 tablet by mouth daily.  He has not missed any doses recently.  He states that he has occasional bilateral first MCP joint tenderness but no joint swelling.  He has no discomfort in his joints at this time.  He denies any morning stiffness.  He states that on 11/17/2018 he had emergent surgery due to appendicitis.  He states that he did not have any complications or infections.  He did not hold Simponi or Arava during this time.  He states that several weeks postoperatively he developed right-sided sciatica.  He was seeing his chiropractor for the past 8 weeks and was originally going 4 times a week and transitioned to once a week.  Activities of Daily Living:  Patient reports morning stiffness for 0 minutes.   Patient Denies nocturnal pain.  Difficulty dressing/grooming: Denies Difficulty climbing stairs: Denies Difficulty getting out of chair: Denies Difficulty using hands for taps, buttons, cutlery, and/or writing: Denies  Review of Systems  Constitutional: Negative for fatigue and night sweats.  HENT: Negative for mouth sores, mouth dryness and nose dryness.   Eyes: Negative for redness and dryness.  Respiratory: Negative for cough, hemoptysis, shortness of breath and difficulty breathing.   Cardiovascular: Negative for chest pain, palpitations, hypertension, irregular heartbeat and swelling in legs/feet.  Gastrointestinal: Negative for blood in stool, constipation and diarrhea.  Endocrine: Negative for increased urination.  Genitourinary: Negative for  painful urination.  Musculoskeletal: Positive for arthralgias and joint pain. Negative for joint swelling, myalgias, muscle weakness, morning stiffness, muscle tenderness and myalgias.  Skin: Negative for color change, rash, hair loss, nodules/bumps, skin tightness, ulcers and sensitivity to sunlight.  Allergic/Immunologic: Negative for susceptible to infections.  Neurological: Negative for dizziness, fainting, memory loss, night sweats and weakness.  Hematological: Negative for swollen glands.  Psychiatric/Behavioral: Negative for depressed mood and sleep disturbance. The patient is not nervous/anxious.     PMFS History:  Patient Active Problem List   Diagnosis Date Noted  . History of gastroesophageal reflux (GERD) 01/01/2017  . Rheumatoid arthritis involving multiple sites with positive rheumatoid factor (HCC) 11/08/2016  . High risk medication use 11/08/2016  . Vitiligo 11/08/2016    Past Medical History:  Diagnosis Date  . Allergy   . Arthritis     Family History  Problem Relation Age of Onset  . Diabetes Mother   . Cancer Mother   . COPD Father   . Diabetes Father    Past Surgical History:  Procedure Laterality Date  . APPENDECTOMY  11/17/2018  . CARPAL TUNNEL RELEASE Left 2002  . MANDIBLE SURGERY  1994   Social History   Social History Narrative  . Not on file   Immunization History  Administered Date(s) Administered  . Influenza,inj,Quad PF,6+ Mos 06/03/2017, 06/02/2018     Objective: Vital Signs: BP (!) 142/91 (BP Location: Left Arm, Patient Position: Sitting, Cuff Size: Normal)   Pulse 77   Resp  13   Ht 5\' 11"  (1.803 m)   Wt 193 lb (87.5 kg)   BMI 26.92 kg/m    Physical Exam Vitals signs and nursing note reviewed.  Constitutional:      Appearance: He is well-developed.  HENT:     Head: Normocephalic and atraumatic.  Eyes:     Conjunctiva/sclera: Conjunctivae normal.     Pupils: Pupils are equal, round, and reactive to light.  Neck:      Musculoskeletal: Normal range of motion and neck supple.  Cardiovascular:     Rate and Rhythm: Normal rate and regular rhythm.     Heart sounds: Normal heart sounds.  Pulmonary:     Effort: Pulmonary effort is normal.     Breath sounds: Normal breath sounds.  Abdominal:     General: Bowel sounds are normal.     Palpations: Abdomen is soft.  Skin:    General: Skin is warm and dry.     Capillary Refill: Capillary refill takes less than 2 seconds.  Neurological:     Mental Status: He is alert and oriented to person, place, and time.  Psychiatric:        Behavior: Behavior normal.      Musculoskeletal Exam:   CDAI Exam: CDAI Score: 0.2  Patient Global: 1 mm; Provider Global: 1 mm Swollen: 0 ; Tender: 0  Joint Exam   No joint exam has been documented for this visit   There is currently no information documented on the homunculus. Go to the Rheumatology activity and complete the homunculus joint exam.  Investigation: No additional findings.  Imaging: No results found.  Recent Labs: No results found for: WBC, HGB, PLT, NA, K, CL, CO2, GLUCOSE, BUN, CREATININE, BILITOT, ALKPHOS, AST, ALT, PROT, ALBUMIN, CALCIUM, GFRAA, QFTBGOLD, QFTBGOLDPLUS  Speciality Comments: No specialty comments available.  Procedures:  No procedures performed Allergies: Sulfa antibiotics   Assessment / Plan:     Visit Diagnoses: Rheumatoid arthritis involving multiple sites with positive rheumatoid factor (HCC) - He has no synovitis on exam.  He denies any recent rheumatoid arthritis flares.  He is clinically doing well on Simponi 50 mg subcutaneous injections every 28 days and Arava 10 mg 1 tablet by mouth daily.  He has not missed any doses recently.  He had emergent surgery for appendicitis on 11/17/2018 without any complications.  He did not hold Simponi or Arava during this time.  He has no joint pain or joint swelling at this time.  He has been having intermittent tenderness in bilateral first MCP  joints but no synovitis or tenderness was noted on exam today.  He has complete fist formation bilaterally.  He will continue on his current treatment regimen.  He was advised to notify us if he develops increased joint pain or joint swelling.  He will follow-up in the office in 5 months.  High risk medication use - Simponi 50 mg subcu every 28 days and Arava 10 mg 1 tablet daily.  Last TB gold negative on 09/04/2018 and will monitor yearly.  Most recent CBC/CMP within normal limits except for elevated creatinine but is trending down and low hemoglobin on 01/09/2019.  Due for CBC/CMP in August and standing orders are in place.  He received a flu vaccine in October 2019.  We discussed the importance of having yearly skin exams while on Simponi.  We also discussed that anytime he has an infection he is to hold Simponi and Arava into the infection is cleared.  We discussed  the importance of social distancing and following the standard precautions recommended by the CDC.  Plantar fasciitis - Resolved.   Trigger index finger of right hand - Resolved.   Trigger middle finger of right hand -Resolved.   Elevated serum creatinine - We will continue to monitor closely.    Other medical conditions are listed as follows:   History of gastroesophageal reflux (GERD)   Vitiligo   Orders: No orders of the defined types were placed in this encounter.  Meds ordered this encounter  Medications  . Golimumab (SIMPONI) 50 MG/0.5ML SOSY    Sig: INJECT 50MG  SUBCUTANEOUSLY  EVERY MONTH    Dispense:  1.5 mL    Refill:  0  . leflunomide (ARAVA) 10 MG tablet    Sig: Take 1 tablet (10 mg total) by mouth daily.    Dispense:  90 tablet    Refill:  0      Follow-Up Instructions: Return in about 5 months (around 07/20/2019) for Rheumatoid arthritis.   Ofilia Neas, PA-C   I examined and evaluated the patient with Hazel Sams PA.  Patient had no synovitis on examination today.  He states he had some recent  swelling in his right first MCP joint.  The plan of care was discussed as noted above.  Bo Merino, MD  Note - This record has been created using Editor, commissioning.  Chart creation errors have been sought, but may not always  have been located. Such creation errors do not reflect on  the standard of medical care.

## 2019-02-17 ENCOUNTER — Encounter: Payer: Self-pay | Admitting: Rheumatology

## 2019-02-17 ENCOUNTER — Ambulatory Visit: Payer: Commercial Managed Care - PPO | Admitting: Rheumatology

## 2019-02-17 ENCOUNTER — Other Ambulatory Visit: Payer: Self-pay

## 2019-02-17 VITALS — BP 142/91 | HR 77 | Resp 13 | Ht 71.0 in | Wt 193.0 lb

## 2019-02-17 DIAGNOSIS — M722 Plantar fascial fibromatosis: Secondary | ICD-10-CM

## 2019-02-17 DIAGNOSIS — R7989 Other specified abnormal findings of blood chemistry: Secondary | ICD-10-CM

## 2019-02-17 DIAGNOSIS — M65321 Trigger finger, right index finger: Secondary | ICD-10-CM | POA: Diagnosis not present

## 2019-02-17 DIAGNOSIS — M0579 Rheumatoid arthritis with rheumatoid factor of multiple sites without organ or systems involvement: Secondary | ICD-10-CM | POA: Diagnosis not present

## 2019-02-17 DIAGNOSIS — M65331 Trigger finger, right middle finger: Secondary | ICD-10-CM

## 2019-02-17 DIAGNOSIS — Z79899 Other long term (current) drug therapy: Secondary | ICD-10-CM

## 2019-02-17 DIAGNOSIS — Z8719 Personal history of other diseases of the digestive system: Secondary | ICD-10-CM

## 2019-02-17 DIAGNOSIS — L80 Vitiligo: Secondary | ICD-10-CM

## 2019-02-17 MED ORDER — LEFLUNOMIDE 10 MG PO TABS: 10.0000 mg | ORAL_TABLET | Freq: Every day | ORAL | 0 refills | Status: DC

## 2019-02-17 MED ORDER — SIMPONI 50 MG/0.5ML ~~LOC~~ SOSY: PREFILLED_SYRINGE | SUBCUTANEOUS | 0 refills | Status: DC

## 2019-02-17 NOTE — Patient Instructions (Signed)
Standing Labs We placed an order today for your standing lab work.    Please come back and get your standing labs in August and every 3 months  We have open lab daily Monday through Thursday from 8:30-12:30 PM and 1:30-4:30 PM and Friday from 8:30-12:30 PM and 1:30 -4:00 PM at the office of Dr. Shaili Deveshwar.   You may experience shorter wait times on Monday and Friday afternoons. The office is located at [address], [address], [place], [place] [address] No appointment is necessary.   Labs are drawn by [facility].  You may receive a bill from [facility] for your lab work.  If you wish to have your labs drawn at another location, please call the office 24 hours in advance to send orders.  If you have any questions regarding directions or hours of operation,  please call [phone].   Just as a reminder please drink plenty of water prior to coming for your lab work. Thanks!   

## 2019-03-04 ENCOUNTER — Telehealth: Payer: Self-pay | Admitting: Rheumatology

## 2019-03-04 NOTE — Telephone Encounter (Signed)
Ok to schedule ultrasound guided trigger finger injection.

## 2019-03-04 NOTE — Telephone Encounter (Signed)
Patient calling because he is having swelling with his rt index finger again. Patient unable to make a fist due to swelling. Patient is due for his medication tomorrow, and wants to see if that makes a difference. Patient also inquiring about another injection in his finger, but was unable to come in for an appt anytime available between now, and Tuesday of next week. Please call to discuss.

## 2019-03-04 NOTE — Telephone Encounter (Signed)
Patient states he is having trouble with the third finger in his right hand. Patient states he is having pain and some swelling. Patient states he has used Voltaren Gel which helped with the swelling. Patient states he is scheduled to take the Simponi injection today. Patient has trigger finger injection in 12/2016 and 11/2017. Patient would like to know if it is suggested to do another injection. Please advise.

## 2019-03-05 NOTE — Telephone Encounter (Signed)
Patient advised we can schedule an ultrasound guided injection. Patient scheduled for 03/06/19 at 11:30 am.

## 2019-03-06 ENCOUNTER — Ambulatory Visit: Payer: Commercial Managed Care - PPO | Admitting: Rheumatology

## 2019-03-19 ENCOUNTER — Telehealth: Payer: Self-pay | Admitting: Pharmacist

## 2019-03-19 NOTE — Telephone Encounter (Signed)
Received prior authorization request form from [employer].  Filled out form and faxed along with most recent office note. Will update when we receive a response.  EOC ID: [id number] Phone # [phone] Fax # [phone]

## 2019-03-19 NOTE — Telephone Encounter (Signed)
Submitted PA via Covermymeds and received a response to call [employer] at [phone].  Called and spoke to Rep who stated that they don't do PA's over the phone or online. They will send PA form to the provider's Fax# on file (confirmed) and they will need chart notes attached. He will fax over momentarily.  12:35 PM Timothy Waters, CPhT

## 2019-03-19 NOTE — Telephone Encounter (Signed)
Received PA request from [facility] for [place]. Please submit to covermymeds.    Key [id number]

## 2019-03-22 ENCOUNTER — Other Ambulatory Visit: Payer: Self-pay | Admitting: Physician Assistant

## 2019-03-25 NOTE — Telephone Encounter (Signed)
Received fax fro [employer] stating that they are unable to continue prior authorization review due to lack of information.  Request was faxed on 03/19/2019.  Can you please call and follow up at [phone]?  I will fax again.

## 2019-03-25 NOTE — Telephone Encounter (Signed)
Faxed prior authorization request and supporting documents to [phone]. Will update when we receive a response.  Mariella Saa, PharmD, [place], [place] Clinical Specialty Pharmacist [phone]  03/25/2019 9:13 AM

## 2019-03-25 NOTE — Telephone Encounter (Signed)
Received notification from [facility] regarding a prior authorization for [facility]. Authorization has been APPROVED from 03/25/2019 to 03/24/2020.   Must fill at [facility].  Will send document to scan center.  EOC ID # [id number] Phone # [phone]  Mariella Saa, PharmD, [place], [place] Clinical Specialty Pharmacist [phone]  03/25/2019 3:13 PM

## 2019-03-25 NOTE — Telephone Encounter (Signed)
Called [facility] and they stated that they did not receive fax with clinicals. Requested [place] to try to refax to number on form. Rep also provided an alternative fax number: [phone].  Will follow up.  9:07 AM Beatriz Chancellor, CPhT

## 2019-03-30 ENCOUNTER — Telehealth: Payer: Self-pay | Admitting: Rheumatology

## 2019-03-30 NOTE — Telephone Encounter (Signed)
Patient advised we sent a 90 day supply 02/17/19 and complete a PA on 03/19/19. Patient states he will reach out the pharmacy.

## 2019-03-30 NOTE — Telephone Encounter (Signed)
Patient left a voicemail requesting prescription refill of Simponi to be sent to [facility].

## 2019-04-12 ENCOUNTER — Other Ambulatory Visit: Payer: Self-pay | Admitting: Physician Assistant

## 2019-05-13 ENCOUNTER — Other Ambulatory Visit: Payer: Self-pay | Admitting: Physician Assistant

## 2019-05-13 NOTE — Telephone Encounter (Signed)
Last Visit:02/17/19 Next Visit:07/21/19 Labs: 01/09/19 elevated creatinine but is trending down and low hemoglobin  TB Gold: 09/04/18 Neg   Patient states he has recently had labs done with PCP and will have them faxed to our office.  Okay to refill 30 day supply per Dr. Estanislado Pandy

## 2019-05-15 ENCOUNTER — Telehealth: Payer: Self-pay | Admitting: *Deleted

## 2019-05-15 NOTE — Telephone Encounter (Signed)
error 

## 2019-06-10 ENCOUNTER — Other Ambulatory Visit: Payer: Self-pay | Admitting: Rheumatology

## 2019-06-10 NOTE — Telephone Encounter (Signed)
Last Visit:02/17/19 Next Visit:07/21/19 Labs: 05/01/19 RBC 5/96 MCH 25.3 MCHC 31.1, EOS ABS 1.2 Creat 1.31 TB Gold: 09/04/18 Neg   Okay to refill per Dr. Estanislado Pandy

## 2019-07-07 NOTE — Progress Notes (Signed)
Virtual Visit via Telephone Note  I connected with Timothy Waters on 07/21/19 at  3:00 PM EST by telephone and verified that I am speaking with the correct person using two identifiers.  Location: Patient: Home  Provider: Clinic  This service was conducted via virtual visit.  Both audio and visual tools were used.  The patient was located at home. I was located in my office.  Consent was obtained prior to the virtual visit and is aware of possible charges through their insurance for this visit.  The patient is an established patient.  Dr. Estanislado Pandy, MD conducted the virtual visit and Hazel Sams, PA-C acted as scribe during the service.  Office staff helped with scheduling follow up visits after the service was conducted.   I discussed the limitations, risks, security and privacy concerns of performing an evaluation and management service by telephone and the availability of in person appointments. I also discussed with the patient that there may be a patient responsible charge related to this service. The patient expressed understanding and agreed to proceed.  CC: Medication monitoring  History of Present Illness: Patient is a 58 year old male with a past medical history of seropositive rheumatoid arthritis.  He is on Simponi 50 mg sq injections every 28 days and arava 10 mg po daily. He has not missed any doses recently. He denies any recent rheumatoid arthritis flares.  He denies any joint pain or joint swelling.  He denies any morning stiffness.  He denies any plantar fasciitis currently.  His trigger fingers have resolved.    He rates his RA a 1/10.   Review of Systems  Constitutional: Negative for fever and malaise/fatigue.  Eyes: Negative for photophobia, pain, discharge and redness.  Respiratory: Negative for cough, shortness of breath and wheezing.   Cardiovascular: Negative for chest pain and palpitations.  Gastrointestinal: Negative for blood in stool, constipation and diarrhea.   Genitourinary: Negative for dysuria.  Musculoskeletal: Negative for back pain, joint pain, myalgias and neck pain.  Skin: Negative for rash.  Neurological: Negative for dizziness and headaches.  Psychiatric/Behavioral: Negative for depression. The patient is not nervous/anxious and does not have insomnia.       Observations/Objective: Physical Exam  Constitutional: He is oriented to person, place, and time.  Neurological: He is alert and oriented to person, place, and time.  Psychiatric: Mood, memory, affect and judgment normal.   Patient reports morning stiffness for 0 minutes.   Patient denies nocturnal pain.  Difficulty dressing/grooming: Denies Difficulty climbing stairs: Denies Difficulty getting out of chair: Denies Difficulty using hands for taps, buttons, cutlery, and/or writing: Denies    Assessment and Plan: Visit Diagnoses: Rheumatoid arthritis involving multiple sites with positive rheumatoid factor ([place]) -He has not had any recent rheumatoid arthritis flares.  He has no joint pain or joint swelling.  He has no morning stiffness.  He has not experienced any nocturnal pain.  He is clinically doing well on Simponi 50 mg subcutaneous injections every 28 days and Arava 10 mg 1 tablet by mouth daily. He has not missed any doses recently.  He does not need refills at this time.  He is due to update lab work at the end of December and every 3 months.  He was advised to notify us if he develops increased joint pain or joint swelling.  He will follow up in 5 months.  High risk medication use - Simponi 50 mg subcu every 28 days and Arava 10 mg 1 tablet  daily.  Last TB gold negative on 09/04/2018 and will monitor yearly.  He is due to update lab work at the end of December.  He plans on having updated lab work at his PCPs office.   Plantar fasciitis - Resolved.   Trigger index finger of right hand - Resolved.   Trigger middle finger of right hand -Resolved.   Elevated serum  creatinine - We will continue to monitor closely.    Other medical conditions are listed as follows:   History of gastroesophageal reflux (GERD)   Vitiligo   Follow Up Instructions: He will follow up in 5 months.    I discussed the assessment and treatment plan with the patient. The patient was provided an opportunity to ask questions and all were answered. The patient agreed with the plan and demonstrated an understanding of the instructions.   The patient was advised to call back or seek an in-person evaluation if the symptoms worsen or if the condition fails to improve as anticipated.  I provided 15  minutes of non-face-to-face time during this encounter.   Pollyann Savoy, MD   Scribed by-  Sherron Ales, PA-C

## 2019-07-21 ENCOUNTER — Encounter: Payer: Self-pay | Admitting: Rheumatology

## 2019-07-21 ENCOUNTER — Telehealth (INDEPENDENT_AMBULATORY_CARE_PROVIDER_SITE_OTHER): Payer: Commercial Managed Care - PPO | Admitting: Rheumatology

## 2019-07-21 ENCOUNTER — Other Ambulatory Visit: Payer: Self-pay

## 2019-07-21 DIAGNOSIS — M0579 Rheumatoid arthritis with rheumatoid factor of multiple sites without organ or systems involvement: Secondary | ICD-10-CM | POA: Diagnosis not present

## 2019-07-21 DIAGNOSIS — M65331 Trigger finger, right middle finger: Secondary | ICD-10-CM

## 2019-07-21 DIAGNOSIS — M722 Plantar fascial fibromatosis: Secondary | ICD-10-CM | POA: Diagnosis not present

## 2019-07-21 DIAGNOSIS — Z79899 Other long term (current) drug therapy: Secondary | ICD-10-CM | POA: Diagnosis not present

## 2019-07-21 DIAGNOSIS — Z8719 Personal history of other diseases of the digestive system: Secondary | ICD-10-CM

## 2019-07-21 DIAGNOSIS — R7989 Other specified abnormal findings of blood chemistry: Secondary | ICD-10-CM

## 2019-07-21 DIAGNOSIS — M65321 Trigger finger, right index finger: Secondary | ICD-10-CM | POA: Diagnosis not present

## 2019-07-21 DIAGNOSIS — L80 Vitiligo: Secondary | ICD-10-CM

## 2019-07-22 ENCOUNTER — Other Ambulatory Visit: Payer: Self-pay | Admitting: Physician Assistant

## 2019-07-22 NOTE — Telephone Encounter (Signed)
Last Visit: 07/21/2019 telemedicine  Next Visit: message sent to the front desk to schedule.  Labs: 05/01/2019   Okay to refill per Dr. Estanislado Pandy.

## 2019-07-29 ENCOUNTER — Telehealth: Payer: Self-pay | Admitting: Rheumatology

## 2019-07-29 NOTE — Telephone Encounter (Signed)
Patient states his rx has been shipped from [facility] and should be arriving today or tomorrow. Advised patient to take his medication as prescribed once received. Patient verbalized understanding.

## 2019-07-29 NOTE — Telephone Encounter (Signed)
Patient left a message stating he has not received his rx for Leflutnomide in the mail as of yet, and he is 5 days without medication now. Patient would like to know once he receives medication, does he need to return to taking 1 pill a day, or more since he has not had any for 5 days? Please call to advise.

## 2019-08-11 ENCOUNTER — Telehealth: Payer: Self-pay | Admitting: Rheumatology

## 2019-08-11 NOTE — Telephone Encounter (Signed)
Called pharmacy.  The they gave was for the mail order pharmacy.  Transferred to specialty pharmacy. They state issue was resolved and will be shipped tomorrow.  Called and notified patient.  Instructed patient to call if he has any other issues and will not get his injection by 08/22/2019.  Mariella Saa, PharmD, [place], [place] Clinical Specialty Pharmacist [phone]  08/11/2019 3:59 PM

## 2019-08-11 NOTE — Telephone Encounter (Signed)
Patient called stating he received an email from [facility] stating they are "out of stock" of Simponi and to contact Dr. Estanislado Pandy to request another covered medication.  Patient states he is due to take his next injection on 08/22/2019.  Patient states the email states that Dr. Estanislado Pandy needs to contact the "out of stock team" at [phone]

## 2019-08-19 ENCOUNTER — Other Ambulatory Visit: Payer: Self-pay | Admitting: Rheumatology

## 2019-08-20 NOTE — Telephone Encounter (Signed)
Last Visit: 07/21/2019 telemedicine  Next Visit: 01/12/2020  Okay to refill per Dr. Estanislado Pandy.

## 2019-08-24 ENCOUNTER — Other Ambulatory Visit: Payer: Self-pay | Admitting: Rheumatology

## 2019-09-02 ENCOUNTER — Other Ambulatory Visit: Payer: Self-pay | Admitting: Rheumatology

## 2019-09-02 NOTE — Telephone Encounter (Addendum)
Last Visit: 07/21/2019 telemedicine  Next Visit: 01/12/20 Labs: 05/01/19 RBC 5.96, MCH 25.3, MCHC 31.1 Creat. 1.31 TB Gold: 09/04/2018 neg   Left message to advise patient he is due to update labs.  Okay to refill 30 day supply Simponi?

## 2019-09-02 NOTE — Telephone Encounter (Signed)
Ok to refill 30 day supply.  He will require update TB Gold as well.

## 2019-09-07 ENCOUNTER — Encounter: Payer: Self-pay | Admitting: Rheumatology

## 2019-09-10 ENCOUNTER — Telehealth: Payer: Self-pay | Admitting: Rheumatology

## 2019-09-10 NOTE — Telephone Encounter (Signed)
Please notify patient that we do recommend proceeding with his Simponi injection tomorrow.

## 2019-09-10 NOTE — Telephone Encounter (Signed)
Advised patient that we do recommend proceeding with his Simponi injection tomorrow. patient verbalized understanding.

## 2019-09-10 NOTE — Telephone Encounter (Signed)
I reviewed labs from September 03, 2019.  It showed his ALT was elevated at 72.  Creatinine was 1.31 which was a stable.  I called patient to discuss labs.  He states he has been taking ibuprofen intermittently for headaches.  He is also been taking omega-3 supplement.  I have advised him to discontinue omega-3 and ibuprofen.  I have also advised him to have CMP with [facility] repeated in 1 month.  He was in agreement.  He will contact his PCP about repeat labs. Pollyann Savoy, MD

## 2019-09-10 NOTE — Telephone Encounter (Signed)
Patient called stating he spoke with Dr. Corliss Skains earlier today regarding his labwork results and forgot to ask if he should still take his Simponi injection tomorrow.  Patient requested a return call.

## 2019-09-25 ENCOUNTER — Other Ambulatory Visit: Payer: Self-pay | Admitting: Rheumatology

## 2019-09-25 DIAGNOSIS — Z9225 Personal history of immunosupression therapy: Secondary | ICD-10-CM

## 2019-09-25 DIAGNOSIS — Z79899 Other long term (current) drug therapy: Secondary | ICD-10-CM

## 2019-09-25 NOTE — Telephone Encounter (Signed)
Last Visit:07/21/2019 telemedicine Next Visit:01/12/20 Labs: 09/03/19 MCH 26.1, EOS AST 45 ALT 72 TB Gold: 09/04/2018 neg   Patient reminded he is due to repeat CMP this month and update TB Gold  Okay to refill 30 days supply per Dr. Corliss Skains

## 2019-09-28 ENCOUNTER — Other Ambulatory Visit: Payer: Self-pay | Admitting: Rheumatology

## 2019-09-28 NOTE — Telephone Encounter (Signed)
Ok to refill but please advise patient to update lab work next week.

## 2019-09-28 NOTE — Telephone Encounter (Signed)
Advised patient to update lab work next week and patient states he has a lab appointment scheduled for 10/09/2019.

## 2019-09-28 NOTE — Telephone Encounter (Signed)
Last Visit: 07/21/2019 telemedicine  Next Visit: 01/12/2020 Labs: 09/07/2019 creat 1.31, AST 45, ALT 72, MCH 26.1. we will recheck CMP in 1 month.   Okay to refill arava?

## 2019-10-14 ENCOUNTER — Telehealth: Payer: Self-pay | Admitting: *Deleted

## 2019-10-14 NOTE — Telephone Encounter (Signed)
Lab results received from [facility].  Reviewed by Sherron Ales, PA-C  Drawn 10/09/19  Tb Gold Negative MCH 26.5 Creat. 1.28, Previously 1.31  Patient is on Arava 10 mg daily and Simponi 50 mg monthly.  Patient advised.

## 2019-10-20 ENCOUNTER — Other Ambulatory Visit: Payer: Self-pay | Admitting: Rheumatology

## 2019-10-20 NOTE — Telephone Encounter (Signed)
Last Visit:07/21/2019 telemedicine Next Visit:01/12/20 Labs: 10/09/19 MCH 26.5 Creat. 1.28 TB Gold: 10/09/19 Neg   Okay to refill per Dr. Corliss Skains

## 2019-12-07 NOTE — Progress Notes (Signed)
Creatinine is mildly increased and stable.

## 2019-12-11 ENCOUNTER — Telehealth: Payer: Self-pay | Admitting: Rheumatology

## 2019-12-11 NOTE — Telephone Encounter (Signed)
Patient requesting refill on Simpona 50 mg sent to [facility].

## 2019-12-11 NOTE — Telephone Encounter (Signed)
A 90 day supply of simponi was sent on 10/20/2019 to [facility]. Advised patient and he verbalized understanding and will contact [facility].

## 2019-12-14 ENCOUNTER — Other Ambulatory Visit: Payer: Self-pay | Admitting: Rheumatology

## 2019-12-25 ENCOUNTER — Other Ambulatory Visit: Payer: Self-pay | Admitting: *Deleted

## 2019-12-25 ENCOUNTER — Telehealth: Payer: Self-pay | Admitting: Rheumatology

## 2019-12-25 DIAGNOSIS — Z79899 Other long term (current) drug therapy: Secondary | ICD-10-CM

## 2019-12-25 NOTE — Telephone Encounter (Signed)
Patient going to Lonie Peak, [facility] at [facility] for lab draw Monday (12/28/19). Please send orders.

## 2019-12-25 NOTE — Telephone Encounter (Signed)
Lab Orders faxed

## 2019-12-26 ENCOUNTER — Other Ambulatory Visit: Payer: Self-pay | Admitting: Physician Assistant

## 2019-12-28 NOTE — Telephone Encounter (Signed)
Last Visit: 07/21/2019 telemedicine  Next Visit: 01/12/2020 Labs: 10/09/2019 Creatinine is mildly increased and stable.  Okay to refill per Dr. Corliss Skains.

## 2019-12-30 ENCOUNTER — Telehealth: Payer: Self-pay | Admitting: *Deleted

## 2019-12-30 NOTE — Telephone Encounter (Signed)
Received results from Lonie Peak, [facility] on 12/28/2019 Reviewed by Sherron Ales, PA-C  MCH 25.5 EOS (absolute) 0.6 Creatinine 1.33 BUN/Creatinine Ration 8  Patient on Arava 10 mg daily and Simponi 50 mg monthly.

## 2019-12-31 NOTE — Progress Notes (Signed)
Office Visit Note  Patient: Timothy Waters             Date of Birth: September 23, 1960           MRN: [id number]             PCP: Cyndi Bender, PA-C Referring: Cyndi Bender, PA-C Visit Date: 01/12/2020 Occupation: @GUAROCC @  Subjective:  Medication monitoring  History of Present Illness: Timothy Waters is a 59 y.o. male with history of seropositive rheumatoid arthritis.  He is on Simponi sq injections every 28 days and Arava 10 mg 1 tablet by mouth daily.  He has been tolerating his medications well.  He denies any joint pain or joint swelling.  He states he has done some dietary modifications.  He is also taking his medications on a regular basis.  Activities of Daily Living:  Patient reports morning stiffness for 0  minutes.   Patient Denies nocturnal pain.  Difficulty dressing/grooming: Denies Difficulty climbing stairs: Denies Difficulty getting out of chair: Denies Difficulty using hands for taps, buttons, cutlery, and/or writing: Denies  Review of Systems  Constitutional: Negative for fatigue and night sweats.  HENT: Negative for mouth sores, mouth dryness and nose dryness.   Eyes: Negative for redness and dryness.  Respiratory: Negative for cough, hemoptysis, shortness of breath and difficulty breathing.   Cardiovascular: Negative for chest pain, palpitations, hypertension, irregular heartbeat and swelling in legs/feet.  Gastrointestinal: Positive for constipation. Negative for blood in stool and diarrhea.  Endocrine: Negative for increased urination.  Genitourinary: Negative for painful urination.  Musculoskeletal: Negative for arthralgias, joint pain, joint swelling, myalgias, muscle weakness, morning stiffness, muscle tenderness and myalgias.  Skin: Negative for color change, rash, hair loss, nodules/bumps, skin tightness, ulcers and sensitivity to sunlight.  Allergic/Immunologic: Negative for susceptible to infections.  Neurological: Negative for dizziness, fainting, memory  loss, night sweats and weakness.  Hematological: Negative for swollen glands.  Psychiatric/Behavioral: Negative for depressed mood and sleep disturbance. The patient is not nervous/anxious.     PMFS History:  Patient Active Problem List   Diagnosis Date Noted  . Seasonal allergies 01/12/2020  . History of gastroesophageal reflux (GERD) 01/01/2017  . Rheumatoid arthritis involving multiple sites with positive rheumatoid factor ([place]) 11/08/2016  . High risk medication use 11/08/2016  . Vitiligo 11/08/2016    Past Medical History:  Diagnosis Date  . Allergy   . Arthritis     Family History  Problem Relation Age of Onset  . Diabetes Mother   . Cancer Mother   . COPD Father   . Diabetes Father    Past Surgical History:  Procedure Laterality Date  . APPENDECTOMY  11/17/2018  . CARPAL TUNNEL RELEASE Left 2002  . [place]   Social History   Social History Narrative  . Not on file   Immunization History  Administered Date(s) Administered  . Influenza,inj,Quad PF,6+ Mos 06/03/2017, 06/02/2018     Objective: Vital Signs: BP (!) 138/91 (BP Location: Left Arm, Patient Position: Sitting, Cuff Size: Normal)   Pulse 75   Resp 15   Ht 5\' 11"  (1.803 m)   Wt 192 lb (87.1 kg)   BMI 26.78 kg/m    Physical Exam Vitals and nursing note reviewed.  Constitutional:      Appearance: He is well-developed.  HENT:     Head: Normocephalic and atraumatic.  Eyes:     Conjunctiva/sclera: Conjunctivae normal.     Pupils: Pupils are equal, round, and reactive to light.  Pulmonary:  Effort: Pulmonary effort is normal.  Abdominal:     General: Bowel sounds are normal.     Palpations: Abdomen is soft.  Musculoskeletal:     Cervical back: Normal range of motion and neck supple.  Skin:    General: Skin is warm and dry.     Capillary Refill: Capillary refill takes less than 2 seconds.  Neurological:     Mental Status: He is alert and oriented to person, place, and time.    Psychiatric:        Behavior: Behavior normal.      Musculoskeletal Exam: C-spine thoracic and lumbar spine were in good range of motion.  Shoulder joints, elbow joints, wrist joints, MCPs and PIPs and DIPs with good range of motion.  He has mild DIP and PIP thickening.  No synovitis was noted.  Hip joints, knee joints, ankles with good range of motion.  There was no tenderness over MTPs and ankles.  CDAI Exam: CDAI Score: 0  Patient Global: 0 mm; Provider Global: 0 mm Swollen: 0 ; Tender: 0  Joint Exam 01/12/2020   No joint exam has been documented for this visit   There is currently no information documented on the homunculus. Go to the Rheumatology activity and complete the homunculus joint exam.  Investigation: No additional findings.  Imaging: No results found.  Recent Labs: No results found for: WBC, HGB, PLT, NA, K, CL, CO2, GLUCOSE, BUN, CREATININE, BILITOT, ALKPHOS, AST, ALT, PROT, ALBUMIN, CALCIUM, GFRAA, QFTBGOLD, QFTBGOLDPLUS  Speciality Comments: Prior therapy includes: methotrexate (elevated creatinine), Arava ( rash in the past), Imuran (multiple SE's), and inadequate response to ENBREL, HUMIRA, ORENCIA, XELJANZ and SIMPONI ARIA.  Procedures:  No procedures performed Allergies: Sulfa antibiotics   Assessment / Plan:     Visit Diagnoses: Rheumatoid arthritis involving multiple sites with positive rheumatoid factor (HCC)-he is doing extremely well with no synovitis on examination.  We will continue current regimen.  High risk medication use - Simponi 50 mg subcu every 28 days and Arava 10 mg 1 tablet daily.  Dec 28, 2019 labs at PCP showed CBC normal, creatinine 1.33  Elevated serum creatinine-his creatinine has been elevated for a while.  I am uncertain about the true etiology.  His blood pressure has been elevated as well.  Have advised him to schedule an appointment with his PCP to discuss this further.  Vitiligo-he has some new lesions of vitiligo.  History  of gastroesophageal reflux (GERD)-he is currently on omeprazole and Pepcid.  Have advised him to discuss this further with his PCP.  Seasonal allergies-improved on current regimen.  Orders: No orders of the defined types were placed in this encounter.    Follow-Up Instructions: Return in about 5 months (around 06/13/2020) for Rheumatoid arthritis.   Pollyann Savoy, MD  Note - This record has been created using [employer].  Chart creation errors have been sought, but may not always  have been located. Such creation errors do not reflect on  the standard of medical care.

## 2020-01-05 ENCOUNTER — Other Ambulatory Visit: Payer: Self-pay | Admitting: Rheumatology

## 2020-01-05 NOTE — Telephone Encounter (Signed)
Last Visit: 07/21/2019 telemedicine  Next Visit: 01/12/2020 Labs: 12/28/2019 MCH 25.5 EOS (absolute) 0.6 Creatinine 1.33 BUN/Creatinine Ration 8 TB Gold: 10/09/2019 negative    Okay to refill per Dr. Corliss Skains.

## 2020-01-12 ENCOUNTER — Encounter: Payer: Self-pay | Admitting: Rheumatology

## 2020-01-12 ENCOUNTER — Other Ambulatory Visit: Payer: Self-pay

## 2020-01-12 ENCOUNTER — Ambulatory Visit: Payer: Commercial Managed Care - PPO | Admitting: Rheumatology

## 2020-01-12 VITALS — BP 138/91 | HR 75 | Resp 15 | Ht 71.0 in | Wt 192.0 lb

## 2020-01-12 DIAGNOSIS — M0579 Rheumatoid arthritis with rheumatoid factor of multiple sites without organ or systems involvement: Secondary | ICD-10-CM | POA: Diagnosis not present

## 2020-01-12 DIAGNOSIS — L80 Vitiligo: Secondary | ICD-10-CM | POA: Diagnosis not present

## 2020-01-12 DIAGNOSIS — R7989 Other specified abnormal findings of blood chemistry: Secondary | ICD-10-CM | POA: Diagnosis not present

## 2020-01-12 DIAGNOSIS — J302 Other seasonal allergic rhinitis: Secondary | ICD-10-CM

## 2020-01-12 DIAGNOSIS — Z79899 Other long term (current) drug therapy: Secondary | ICD-10-CM

## 2020-01-12 DIAGNOSIS — Z8719 Personal history of other diseases of the digestive system: Secondary | ICD-10-CM

## 2020-01-12 NOTE — Patient Instructions (Signed)
Standing Labs We placed an order today for your standing lab work.    Please come back and get your standing labs in August and every 3 months   We have open lab daily Monday through Thursday from 8:30-12:30 PM and 1:30-4:30 PM and Friday from 8:30-12:30 PM and 1:30-4:00 PM at the office of Dr. Ansley Stanwood.   You may experience shorter wait times on Monday and Friday afternoons. The office is located at [address], [address], [place], [place] [address] No appointment is necessary.   Labs are drawn by [facility].  You may receive a bill from [facility] for your lab work.  If you wish to have your labs drawn at another location, please call the office 24 hours in advance to send orders.  If you have any questions regarding directions or hours of operation,  please call [phone].   Just as a reminder please drink plenty of water prior to coming for your lab work. Thanks!   

## 2020-02-11 ENCOUNTER — Telehealth: Payer: Self-pay | Admitting: Pharmacist

## 2020-02-11 NOTE — Telephone Encounter (Signed)
Received fax from pharmacy stating that they have not been able to reach patient to schedule shipment of Simponi.  Called patient and no answer.  Stating that pharmacy reached out to [place] as they are unable to reach him for one of his medications refills and wants to check in to make sure he was not having any side effects.  Instructed patient to call the office if he is having any issues or to call the pharmacy to set up shipment [phone].  Verlin Fester, PharmD, [place], CPP Clinical Specialty Pharmacist (Rheumatology and Pulmonology)  02/11/2020 11:03 AM

## 2020-03-10 ENCOUNTER — Telehealth: Payer: Self-pay | Admitting: *Deleted

## 2020-03-10 NOTE — Telephone Encounter (Signed)
Results received from PCP. Drawn on 03/09/2020 Reviewed by Sherron Ales, PA-C  CBC/CMP  MCH 25.4 Eos (Absolute) 0.7 Creatinine 1.31  Patient is on Simponi 50 mg SQ monthly and Arava 10 mg po daily.

## 2020-03-12 ENCOUNTER — Other Ambulatory Visit: Payer: Self-pay | Admitting: Rheumatology

## 2020-03-14 NOTE — Telephone Encounter (Signed)
Last Visit: 01/12/2020 Next Visit: 06/14/2020 Labs: 03/10/2020 MCH 25.4 Eos (Absolute) 0.7 Creatinine 1.31  Current Dose per office note 01/12/2020: Arava 10 mg 1 tablet daily DX: Rheumatoid arthritis  Okay to refill Arava?

## 2020-03-15 ENCOUNTER — Telehealth: Payer: Self-pay | Admitting: Pharmacy Technician

## 2020-03-15 NOTE — Telephone Encounter (Signed)
Submitted a Prior Authorization request to [facility] for [facility] via Fax. Will update once we receive a response.   EOC- [id number]

## 2020-03-23 NOTE — Telephone Encounter (Signed)
Received notification from [employer] regarding a prior authorization for [facility]. Authorization has been APPROVED from 03/15/20 to 03/15/21.   Authorization # [id number]  Verlin Fester, PharmD, BCACP, CPP Clinical Specialty Pharmacist (Rheumatology and Pulmonology)  03/23/2020 10:07 AM

## 2020-03-28 ENCOUNTER — Other Ambulatory Visit: Payer: Self-pay | Admitting: Rheumatology

## 2020-03-28 NOTE — Telephone Encounter (Signed)
Patient states [facility] contacted patient to let him know Rx for Simponi was declined. Please call patient to advise.

## 2020-03-29 MED ORDER — SIMPONI 50 MG/0.5ML ~~LOC~~ SOSY: 50.0000 mg | PREFILLED_SYRINGE | SUBCUTANEOUS | 0 refills | Status: DC

## 2020-03-29 NOTE — Telephone Encounter (Signed)
Last Visit: 01/12/2020 Next Visit: 06/14/2020 Labs: 03/10/2020 MCH 25.4 Eos (Absolute) 0.7 Creatinine 1.31 TB Gold: 10/09/2019 Neg   Current Dose per office note 01/12/2020: Simponi 50 mg subcu every 28 days   DX: Rheumatoid arthritis involving multiple sites with positive rheumatoid factor   Okay to refill Simponi?

## 2020-05-18 ENCOUNTER — Other Ambulatory Visit (HOSPITAL_COMMUNITY): Payer: Self-pay | Admitting: Physician Assistant

## 2020-05-30 DIAGNOSIS — M62830 Muscle spasm of back: Secondary | ICD-10-CM | POA: Diagnosis not present

## 2020-05-30 DIAGNOSIS — M5442 Lumbago with sciatica, left side: Secondary | ICD-10-CM | POA: Diagnosis not present

## 2020-05-30 DIAGNOSIS — M546 Pain in thoracic spine: Secondary | ICD-10-CM | POA: Diagnosis not present

## 2020-05-30 DIAGNOSIS — M5136 Other intervertebral disc degeneration, lumbar region: Secondary | ICD-10-CM | POA: Diagnosis not present

## 2020-06-01 NOTE — Progress Notes (Signed)
Office Visit Note  Patient: Timothy Waters             Date of Birth: 22-Sep-1960           MRN: [id number]             PCP: Lonie Peak, PA-C Referring: Lonie Peak, PA-C Visit Date: 06/14/2020 Occupation: @GUAROCC @  Subjective:  Other (right hand and right elbow pain )   History of Present Illness: Timothy Waters is a 59 y.o. male with history of rheumatoid arthritis.  He states he has been doing quite well on the combination therapy.  Complains of some discomfort in his right elbow and sometimes in his right first MCP joint.  He states he has a stiffness but no pain or swelling.  None of the other joints are painful.  Activities of Daily Living:  Patient reports morning stiffness for 20-30 minutes.   Patient Denies nocturnal pain.  Difficulty dressing/grooming: Denies Difficulty climbing stairs: Denies Difficulty getting out of chair: Denies Difficulty using hands for taps, buttons, cutlery, and/or writing: Denies  Review of Systems  Constitutional: Negative for fatigue.  HENT: Negative for mouth sores, mouth dryness and nose dryness.   Eyes: Negative for pain, itching and dryness.  Respiratory: Negative for shortness of breath and difficulty breathing.   Cardiovascular: Negative for chest pain and palpitations.  Gastrointestinal: Negative for blood in stool, constipation and diarrhea.  Endocrine: Negative for increased urination.  Genitourinary: Negative for difficulty urinating.  Musculoskeletal: Positive for arthralgias, joint pain and morning stiffness. Negative for joint swelling, myalgias, muscle tenderness and myalgias.  Skin: Negative for color change, rash and redness.  Allergic/Immunologic: Negative for susceptible to infections.  Neurological: Negative for dizziness, numbness, headaches, memory loss and weakness.  Hematological: Negative for bruising/bleeding tendency.  Psychiatric/Behavioral: Negative for confusion.    PMFS History:  Patient Active Problem  List   Diagnosis Date Noted  . Seasonal allergies 01/12/2020  . History of gastroesophageal reflux (GERD) 01/01/2017  . Rheumatoid arthritis involving multiple sites with positive rheumatoid factor (HCC) 11/08/2016  . High risk medication use 11/08/2016  . Vitiligo 11/08/2016    Past Medical History:  Diagnosis Date  . Allergy   . Arthritis     Family History  Problem Relation Age of Onset  . Diabetes Mother   . Cancer Mother   . COPD Father   . Diabetes Father    Past Surgical History:  Procedure Laterality Date  . APPENDECTOMY  11/17/2018  . CARPAL TUNNEL RELEASE Left 2002  . MANDIBLE SURGERY  1994   Social History   Social History Narrative  . Not on file   Immunization History  Administered Date(s) Administered  . Influenza,inj,Quad PF,6+ Mos 06/03/2017, 06/02/2018  . Moderna SARS-COVID-2 Vaccination 10/16/2019, 11/18/2019, 05/13/2020     Objective: Vital Signs: BP (!) 144/88 (BP Location: Left Arm, Patient Position: Sitting, Cuff Size: Normal)   Pulse 62   Resp 14   Ht 5\' 11"  (1.803 m)   Wt 182 lb (82.6 kg)   BMI 25.38 kg/m    Physical Exam Vitals and nursing note reviewed.  Constitutional:      Appearance: He is well-developed.  HENT:     Head: Normocephalic and atraumatic.  Eyes:     Conjunctiva/sclera: Conjunctivae normal.     Pupils: Pupils are equal, round, and reactive to light.  Cardiovascular:     Rate and Rhythm: Normal rate and regular rhythm.     Heart sounds: Normal heart sounds.  Pulmonary:  Effort: Pulmonary effort is normal.     Breath sounds: Normal breath sounds.  Abdominal:     General: Bowel sounds are normal.     Palpations: Abdomen is soft.  Musculoskeletal:     Cervical back: Normal range of motion and neck supple.  Skin:    General: Skin is warm and dry.     Capillary Refill: Capillary refill takes less than 2 seconds.  Neurological:     Mental Status: He is alert and oriented to person, place, and time.    Psychiatric:        Behavior: Behavior normal.      Musculoskeletal Exam: C-spine thoracic and lumbar spine with good range of motion.  Shoulder joints, elbow joints, wrist joints with good range of motion.  He had mild tenderness over right lateral epicondyle region.  He has no MCP, PIP or DIP synovitis.  Hip joints, knee joints, ankles were in good range of motion with no tenderness.  There was no tenderness over MTPs.  CDAI Exam: CDAI Score: 0.4  Patient Global: 2 mm; Provider Global: 2 mm Swollen: 0 ; Tender: 0  Joint Exam 06/14/2020   No joint exam has been documented for this visit   There is currently no information documented on the homunculus. Go to the Rheumatology activity and complete the homunculus joint exam.  Investigation: No additional findings.  Imaging: No results found.  Recent Labs: No results found for: WBC, HGB, PLT, NA, K, CL, CO2, GLUCOSE, BUN, CREATININE, BILITOT, ALKPHOS, AST, ALT, PROT, ALBUMIN, CALCIUM, GFRAA, QFTBGOLD, QFTBGOLDPLUS  Speciality Comments: Prior therapy includes: methotrexate (elevated creatinine), Arava ( rash in the past), Imuran (multiple SE's), and inadequate response to [place], HUMIRA, ORENCIA, XELJANZ and SIMPONI ARIA.    06/11/2020 labs done at his PCP CMP: GFR 67 LFTs normal, TSH normal, CBC IMA globin 15.4, platelets 155(I saw these values on patient's phone close )  Procedures:  No procedures performed Allergies: Sulfa antibiotics   Assessment / Plan:     Visit Diagnoses: Rheumatoid arthritis involving multiple sites with positive rheumatoid factor (HCC)-he is clinically doing well with no synovitis on examination.  He complains of some discomfort in his right first MCP joint early in the morning.  He is very active and lifts heavy objects at work.  High risk medication use - Simponi 50 mg subcu every 28 days and Arava 10 mg 1 tablet daily.  His labs are stable.  His GFR is normal now.  His last labs were in October.  Have  advised him to get labs every 3 months.  He will get TB Gold with his next labs.  His last TB gold was in February 2021.  Right lateral epicondylitis-I advised the stretching exercises.  Elevated serum creatinine-stable  Vitiligo  History of gastroesophageal reflux (GERD)  Seasonal allergies  Educate about COVID-19 virus infection-he is fully vaccinated.  Use of mask, social distancing and hand hygiene was discussed.  Use of monoclonal antibodies were discussed in case he develops COVID-19 infection.  Orders: No orders of the defined types were placed in this encounter.  Meds ordered this encounter  Medications  . Golimumab (SIMPONI) 50 MG/0.5ML SOSY    Sig: INJECT 50MG  SUBCUTANEOUSLY  MONTHLY    Dispense:  1.5 mL    Refill:  0     Follow-Up Instructions: Return in about 5 months (around 11/12/2020) for Rheumatoid arthritis.   11/14/2020, MD  Note - This record has been created using Pollyann Savoy.  Chart  creation errors have been sought, but may not always  have been located. Such creation errors do not reflect on  the standard of medical care.

## 2020-06-10 DIAGNOSIS — M069 Rheumatoid arthritis, unspecified: Secondary | ICD-10-CM | POA: Diagnosis not present

## 2020-06-10 DIAGNOSIS — J45991 Cough variant asthma: Secondary | ICD-10-CM | POA: Diagnosis not present

## 2020-06-10 DIAGNOSIS — Z23 Encounter for immunization: Secondary | ICD-10-CM | POA: Diagnosis not present

## 2020-06-10 DIAGNOSIS — Z1331 Encounter for screening for depression: Secondary | ICD-10-CM | POA: Diagnosis not present

## 2020-06-10 DIAGNOSIS — K219 Gastro-esophageal reflux disease without esophagitis: Secondary | ICD-10-CM | POA: Diagnosis not present

## 2020-06-10 DIAGNOSIS — Z0000 Encounter for general adult medical examination without abnormal findings: Secondary | ICD-10-CM | POA: Diagnosis not present

## 2020-06-10 DIAGNOSIS — Z6825 Body mass index (BMI) 25.0-25.9, adult: Secondary | ICD-10-CM | POA: Diagnosis not present

## 2020-06-10 DIAGNOSIS — Z79899 Other long term (current) drug therapy: Secondary | ICD-10-CM | POA: Diagnosis not present

## 2020-06-12 ENCOUNTER — Other Ambulatory Visit: Payer: Self-pay | Admitting: Rheumatology

## 2020-06-13 NOTE — Telephone Encounter (Signed)
Last Visit: 01/12/2020 Next Visit: 06/14/2020 Labs: 03/10/2020 MCH 25.4 Eos (Absolute) 0.7 Creatinine 1.31 TB Gold: 10/09/2019 Neg   Current Dose per office note 01/12/2020: Simponi 50 mg subcu every 28 days  DX: Rheumatoid arthritis  Okay to refill Simponi?

## 2020-06-13 NOTE — Telephone Encounter (Signed)
Please advise the patient to return to update lab work ASAP.  Ok to refill a one month supply.

## 2020-06-13 NOTE — Telephone Encounter (Signed)
Patient has appointment on 06/14/2020. Patient to update labs at that appointment.

## 2020-06-14 ENCOUNTER — Encounter: Payer: Self-pay | Admitting: Rheumatology

## 2020-06-14 ENCOUNTER — Other Ambulatory Visit: Payer: Self-pay

## 2020-06-14 ENCOUNTER — Ambulatory Visit (INDEPENDENT_AMBULATORY_CARE_PROVIDER_SITE_OTHER): Payer: 59 | Admitting: Rheumatology

## 2020-06-14 VITALS — BP 144/88 | HR 62 | Resp 14 | Ht 71.0 in | Wt 182.0 lb

## 2020-06-14 DIAGNOSIS — Z79899 Other long term (current) drug therapy: Secondary | ICD-10-CM

## 2020-06-14 DIAGNOSIS — Z8719 Personal history of other diseases of the digestive system: Secondary | ICD-10-CM

## 2020-06-14 DIAGNOSIS — R7989 Other specified abnormal findings of blood chemistry: Secondary | ICD-10-CM

## 2020-06-14 DIAGNOSIS — M7711 Lateral epicondylitis, right elbow: Secondary | ICD-10-CM | POA: Diagnosis not present

## 2020-06-14 DIAGNOSIS — L80 Vitiligo: Secondary | ICD-10-CM

## 2020-06-14 DIAGNOSIS — M0579 Rheumatoid arthritis with rheumatoid factor of multiple sites without organ or systems involvement: Secondary | ICD-10-CM | POA: Diagnosis not present

## 2020-06-14 DIAGNOSIS — J302 Other seasonal allergic rhinitis: Secondary | ICD-10-CM | POA: Diagnosis not present

## 2020-06-14 DIAGNOSIS — Z7189 Other specified counseling: Secondary | ICD-10-CM

## 2020-06-14 MED ORDER — SIMPONI 50 MG/0.5ML ~~LOC~~ SOSY: PREFILLED_SYRINGE | SUBCUTANEOUS | 0 refills | Status: DC

## 2020-06-14 NOTE — Patient Instructions (Addendum)
Standing Labs We placed an order today for your standing lab work.   Please have your standing labs drawn in January 2022 and every 3 months  Please get TB Gold with your next labs.  If possible, please have your labs drawn 2 weeks prior to your appointment so that the provider can discuss your results at your appointment.  We have open lab daily Monday through Thursday from 8:30-12:30 PM and 1:30-4:30 PM and Friday from 8:30-12:30 PM and 1:30-4:00 PM at the office of Dr. Pollyann Savoy, [facility] Rhe[facility].   Please be advised, patients with office appointments requiring lab work will take precedents over walk-in lab work.  If possible, please come for your lab work on Monday and Friday afternoons, as you may experience shorter wait times. The office is located at [address], [place], [place] [address] No appointment is necessary.   Labs are drawn by [facility]. Please bring your co-pay at the time of your lab draw.  You may receive a bill from [facility] for your lab work.  If you wish to have your labs drawn at another location, please call the office 24 hours in advance to send orders.  If you have any questions regarding directions or hours of operation,  please call [phone].   As a reminder, please drink plenty of water prior to coming for your lab work. Thanks!

## 2020-06-15 ENCOUNTER — Telehealth: Payer: Self-pay | Admitting: Pharmacy Technician

## 2020-06-15 NOTE — Telephone Encounter (Signed)
Patient had insurance change.  Submitted a Prior Authorization request to [facility] for [facility] via Cover My Meds. Will update once we receive a response.   Key: [id number] - PA Case ID: [id number]

## 2020-06-16 ENCOUNTER — Ambulatory Visit (HOSPITAL_BASED_OUTPATIENT_CLINIC_OR_DEPARTMENT_OTHER): Payer: 59 | Admitting: Pharmacist

## 2020-06-16 ENCOUNTER — Telehealth: Payer: Self-pay | Admitting: Pharmacist

## 2020-06-16 ENCOUNTER — Other Ambulatory Visit: Payer: Self-pay | Admitting: Internal Medicine

## 2020-06-16 ENCOUNTER — Other Ambulatory Visit: Payer: Self-pay

## 2020-06-16 DIAGNOSIS — Z79899 Other long term (current) drug therapy: Secondary | ICD-10-CM

## 2020-06-16 MED ORDER — SIMPONI 50 MG/0.5ML ~~LOC~~ SOSY: PREFILLED_SYRINGE | SUBCUTANEOUS | 0 refills | Status: DC

## 2020-06-16 NOTE — Progress Notes (Signed)
  S: Patient presents for review of their specialty medication therapy.  Patient is currently taking Simponi for RA. Patient is managed by Dr. Corliss Skains for this.   Adherence: confirms. He has been taking Simponi for several years.   Efficacy: reports that this works well for him.   Dosing:  Rheumatoid arthritis: SubQ: 50 mg once a month   Dose adjustments: Renal: no dose adjustments (has not been studied) Hepatic: no dose adjustments (has not been studied)  Drug-drug interactions: none identified   Screening: TB test: completed per pt Hepatitis: completed per pt   Monitoring: S/sx of infection: none  CBC: done by his PCP 06/11/2020 S/sx of hypersensitivity: none  S/sx of malignancy: none  S/sx of heart failure: none  S/sx of autoimmune disorder: none   O: No results found for: WBC, HGB, HCT, MCV, PLT    Chemistry   No results found for: NA, K, CL, CO2, BUN, CREATININE, GLU No results found for: CALCIUM, ALKPHOS, AST, ALT, BILITOT    A/P: 1. Medication review: Patient currently on Simponi for RA and is tolerating it well. Reviewed the medication with the patient, including the following: Simponi, golimumab, is a TNF? blocker.  Patient educated on purpose, proper use and potential adverse effects of Simponi. There is an increased risk of infection and malignancy with this medication. Do not give patients live vaccinations while they are on this medication. Subcutaneous injection: Hold autoinjector firmly against skin and inject subcutaneously into thigh, lower abdomen (below navel), or upper arm. A loud click is heard when injection has begun. Continue to hold autoinjector against skin until second click is heard (may take 3 to 15 seconds). Following second click, lift autoinjector from injection site. Rotate injection sites and avoid injecting into tender, red, scaly, hard, or bruised skin, or areas with scars or stretch marks. If multiple injections are required for a single  dose, administer at different sites on body. No recommendations for changes.  Butch Penny, PharmD, CPP Clinical Pharmacist [facility] [phone]

## 2020-06-16 NOTE — Telephone Encounter (Signed)
Received notification from [facility] regarding a prior authorization for [name]. Authorization has been APPROVED from 06/15/20 to 12/13/20.   Authorization # [id number]  Patient will now need to fill through [name]. Called patient and left a message. Pharmacist Franky Macho will be reaching out to him and [name] will need is Simponi copay card details if he has it. Left message with pharmacy phone number.

## 2020-06-16 NOTE — Telephone Encounter (Signed)
Called patient to schedule an appointment for the [employer] [facility]. I was unable to reach the patient so I left a HIPAA-compliant message requesting that the patient return my call.   

## 2020-06-17 ENCOUNTER — Telehealth: Payer: Self-pay

## 2020-06-17 NOTE — Telephone Encounter (Signed)
Patient called stating he was unable to locate his copay card information and requesting a return call.

## 2020-06-17 NOTE — Telephone Encounter (Signed)
Returned call and provided patient with Phone number to [facility] to request new copay debit card. Patient will pay for $5 this month. Nothing further needed.

## 2020-06-20 MED FILL — SIMPONI 50 MG/0.5ML SOSY: 50 | 28 days supply | Qty: 1 | Fill #0

## 2020-06-26 ENCOUNTER — Other Ambulatory Visit: Payer: Self-pay | Admitting: Rheumatology

## 2020-06-27 ENCOUNTER — Other Ambulatory Visit: Payer: Self-pay | Admitting: Physician Assistant

## 2020-06-27 DIAGNOSIS — M546 Pain in thoracic spine: Secondary | ICD-10-CM | POA: Diagnosis not present

## 2020-06-27 DIAGNOSIS — M5442 Lumbago with sciatica, left side: Secondary | ICD-10-CM | POA: Diagnosis not present

## 2020-06-27 DIAGNOSIS — M62830 Muscle spasm of back: Secondary | ICD-10-CM | POA: Diagnosis not present

## 2020-06-27 DIAGNOSIS — M5136 Other intervertebral disc degeneration, lumbar region: Secondary | ICD-10-CM | POA: Diagnosis not present

## 2020-06-27 NOTE — Telephone Encounter (Signed)
Last Visit: 06/14/2020 Next Visit: 11/09/2020 Labs: 06/11/2020 CMP: GFR 67 LFTs normal CBC IMA globin 15.4, platelets 155  Current Dose per office note 06/14/2020: Arava 10 mg 1 tablet daily DX: Rheumatoid arthritis involving multiple sites with positive rheumatoid factor   Okay to refill Arava?

## 2020-07-04 MED FILL — FAMOTIDINE 40 MG TABS: 40 | 90 days supply | Qty: 90 | Fill #0

## 2020-07-04 MED FILL — LEFLUNOMIDE 10 MG TABS: 10 | 90 days supply | Qty: 90 | Fill #0

## 2020-07-05 ENCOUNTER — Telehealth: Payer: Self-pay

## 2020-07-05 NOTE — Telephone Encounter (Signed)
Patient called requesting prescription refill of [place] to be sent to [facility].  Patient was told the prescription would be sent in 24-48 hours.

## 2020-07-06 ENCOUNTER — Other Ambulatory Visit (HOSPITAL_COMMUNITY): Payer: Self-pay | Admitting: Rheumatology

## 2020-07-06 MED ORDER — FOLGARD PO TABS: 1.0000 | ORAL_TABLET | Freq: Two times a day (BID) | ORAL | 2 refills | Status: DC

## 2020-07-06 MED FILL — VIRT-GARD TABLET: 2.2-25-1 | 30 days supply | Qty: 60 | Fill #0

## 2020-07-06 NOTE — Telephone Encounter (Signed)
Last Visit: 06/14/2020 Next Visit: 11/09/2020  Okay to refill per Dr. Deveshwar  

## 2020-07-12 ENCOUNTER — Other Ambulatory Visit: Payer: Self-pay | Admitting: Pharmacist

## 2020-07-12 ENCOUNTER — Other Ambulatory Visit: Payer: Self-pay | Admitting: Rheumatology

## 2020-07-12 MED ORDER — SIMPONI 50 MG/0.5ML ~~LOC~~ SOSY: PREFILLED_SYRINGE | SUBCUTANEOUS | 0 refills | Status: DC

## 2020-07-12 NOTE — Telephone Encounter (Signed)
Last Visit: 06/14/2020 Next Visit: 11/09/2020 Labs: 06/11/2020 CMP:GFR 67LFTs normal CBC IMA globin 15.4, platelets 155  Current Dose per office note 06/14/2020: Simponi 50 mg subcu every 28 days  DX: Rheumatoid arthritis involving multiple sites with positive rheumatoid factor   Okay to refill Simponi?

## 2020-07-13 ENCOUNTER — Other Ambulatory Visit: Payer: Self-pay | Admitting: Rheumatology

## 2020-07-13 ENCOUNTER — Telehealth: Payer: Self-pay

## 2020-07-13 NOTE — Telephone Encounter (Signed)
Lab results received via fax from PCP.   Date: 06/10/2020 CBC, CMP, TSH, B12, PSA, MAGNESIUM   Creatinine 1.34 Albumin 5.1 RBC 6.07 MCH 25.4 EOS (absolute) 0.6  Vit b12: 1246  Lab results reviewed by Sherron Ales, PA-C and sent to the scan center.

## 2020-07-18 MED FILL — SIMPONI 50 MG/0.5ML SOSY: 50 | 28 days supply | Qty: 1 | Fill #0

## 2020-07-25 DIAGNOSIS — M5136 Other intervertebral disc degeneration, lumbar region: Secondary | ICD-10-CM | POA: Diagnosis not present

## 2020-07-25 DIAGNOSIS — M5442 Lumbago with sciatica, left side: Secondary | ICD-10-CM | POA: Diagnosis not present

## 2020-07-25 DIAGNOSIS — M546 Pain in thoracic spine: Secondary | ICD-10-CM | POA: Diagnosis not present

## 2020-07-25 DIAGNOSIS — M62830 Muscle spasm of back: Secondary | ICD-10-CM | POA: Diagnosis not present

## 2020-08-10 ENCOUNTER — Other Ambulatory Visit: Payer: Self-pay | Admitting: Pharmacist

## 2020-08-10 ENCOUNTER — Other Ambulatory Visit: Payer: Self-pay | Admitting: Physician Assistant

## 2020-08-10 MED ORDER — SIMPONI 50 MG/0.5ML ~~LOC~~ SOSY: PREFILLED_SYRINGE | SUBCUTANEOUS | 0 refills | Status: DC

## 2020-08-10 NOTE — Telephone Encounter (Signed)
Last Visit: 06/14/2020 Next Visit: 11/09/2020 Labs: 06/11/2020 CMP:GFR 67LFTs normal CBC IMA globin 15.4, platelets 155  Current Dose per office note 06/14/2020: Simponi 50 mg subcu every 28 days  DX: Rheumatoid arthritis involving multiple sites with positive rheumatoid factor   Okay to refill Simponi?  

## 2020-08-15 MED FILL — SIMPONI 50 MG/0.5ML SOSY: 50 | 28 days supply | Qty: 1 | Fill #0

## 2020-08-29 DIAGNOSIS — M62830 Muscle spasm of back: Secondary | ICD-10-CM | POA: Diagnosis not present

## 2020-08-29 DIAGNOSIS — M5136 Other intervertebral disc degeneration, lumbar region: Secondary | ICD-10-CM | POA: Diagnosis not present

## 2020-08-29 DIAGNOSIS — M546 Pain in thoracic spine: Secondary | ICD-10-CM | POA: Diagnosis not present

## 2020-08-29 DIAGNOSIS — M5442 Lumbago with sciatica, left side: Secondary | ICD-10-CM | POA: Diagnosis not present

## 2020-09-02 ENCOUNTER — Other Ambulatory Visit: Payer: Self-pay | Admitting: Rheumatology

## 2020-09-06 ENCOUNTER — Other Ambulatory Visit: Payer: Self-pay | Admitting: Rheumatology

## 2020-09-12 MED FILL — SIMPONI 50 MG/0.5ML SOSY: 50 | 28 days supply | Qty: 1 | Fill #1

## 2020-09-27 DIAGNOSIS — M5136 Other intervertebral disc degeneration, lumbar region: Secondary | ICD-10-CM | POA: Diagnosis not present

## 2020-09-27 DIAGNOSIS — M546 Pain in thoracic spine: Secondary | ICD-10-CM | POA: Diagnosis not present

## 2020-09-27 DIAGNOSIS — M62830 Muscle spasm of back: Secondary | ICD-10-CM | POA: Diagnosis not present

## 2020-09-27 DIAGNOSIS — M5442 Lumbago with sciatica, left side: Secondary | ICD-10-CM | POA: Diagnosis not present

## 2020-10-03 ENCOUNTER — Other Ambulatory Visit: Payer: Self-pay | Admitting: Physician Assistant

## 2020-10-03 DIAGNOSIS — Z111 Encounter for screening for respiratory tuberculosis: Secondary | ICD-10-CM

## 2020-10-03 DIAGNOSIS — Z79899 Other long term (current) drug therapy: Secondary | ICD-10-CM

## 2020-10-03 DIAGNOSIS — Z9225 Personal history of immunosupression therapy: Secondary | ICD-10-CM

## 2020-10-03 MED FILL — LEFLUNOMIDE 10 MG TABS: 10 | 90 days supply | Qty: 90 | Fill #0

## 2020-10-03 MED FILL — FAMOTIDINE 40 MG TABS: 40 | 90 days supply | Qty: 90 | Fill #1

## 2020-10-03 NOTE — Telephone Encounter (Signed)
Last Visit: 06/14/2020  Next Visit: 11/09/2020 Labs: 06/10/2020 Creatinine 1.34 Albumin 5.1 RBC 6.07 MCH 25.4 EOS (absolute) 0.6  Current Dose per office note 06/14/2020: Arava 10 mg 1 tablet daily DX: Rheumatoid arthritis involving multiple sites with positive rheumatoid factor  Last Fill: 06/27/2020  Patient advised he is due to update labs. Patient will come to the office 10/04/2020 to update.   Okay to refill Arava?

## 2020-10-04 ENCOUNTER — Other Ambulatory Visit: Payer: Self-pay | Admitting: Physician Assistant

## 2020-10-04 ENCOUNTER — Other Ambulatory Visit: Payer: Self-pay

## 2020-10-04 DIAGNOSIS — Z79899 Other long term (current) drug therapy: Secondary | ICD-10-CM | POA: Diagnosis not present

## 2020-10-04 DIAGNOSIS — Z9225 Personal history of immunosupression therapy: Secondary | ICD-10-CM

## 2020-10-04 DIAGNOSIS — Z111 Encounter for screening for respiratory tuberculosis: Secondary | ICD-10-CM | POA: Diagnosis not present

## 2020-10-05 ENCOUNTER — Other Ambulatory Visit: Payer: Self-pay | Admitting: Physician Assistant

## 2020-10-05 NOTE — Progress Notes (Signed)
CMP WNL.  MCV and MCH are borderline low. RBC count, hgb, and hct are WNL.  We will continue to monitor.

## 2020-10-06 ENCOUNTER — Other Ambulatory Visit: Payer: Self-pay | Admitting: Physician Assistant

## 2020-10-06 LAB — CBC WITH DIFFERENTIAL/PLATELET
Absolute Monocytes: 753 cells/uL (ref 200–950)
Basophils Absolute: 58 cells/uL (ref 0–200)
Basophils Relative: 1.1 %
Eosinophils Absolute: 413 cells/uL (ref 15–500)
Eosinophils Relative: 7.8 %
HCT: 43.9 % (ref 38.5–50.0)
Hemoglobin: 14.1 g/dL (ref 13.2–17.1)
Lymphs Abs: 2109 cells/uL (ref 850–3900)
MCH: 25.5 pg — ABNORMAL LOW (ref 27.0–33.0)
MCHC: 32.1 g/dL (ref 32.0–36.0)
MCV: 79.4 fL — ABNORMAL LOW (ref 80.0–100.0)
MPV: 13.2 fL — ABNORMAL HIGH (ref 7.5–12.5)
Monocytes Relative: 14.2 %
Neutro Abs: 1966 cells/uL (ref 1500–7800)
Neutrophils Relative %: 37.1 %
Platelets: 170 10*3/uL (ref 140–400)
RBC: 5.53 10*6/uL (ref 4.20–5.80)
RDW: 13.9 % (ref 11.0–15.0)
Total Lymphocyte: 39.8 %
WBC: 5.3 10*3/uL (ref 3.8–10.8)

## 2020-10-06 LAB — COMPLETE METABOLIC PANEL WITH GFR
AG Ratio: 1.4 (calc) (ref 1.0–2.5)
ALT: 21 U/L (ref 9–46)
AST: 25 U/L (ref 10–35)
Albumin: 4.5 g/dL (ref 3.6–5.1)
Alkaline phosphatase (APISO): 89 U/L (ref 35–144)
BUN: 19 mg/dL (ref 7–25)
CO2: 25 mmol/L (ref 20–32)
Calcium: 9.8 mg/dL (ref 8.6–10.3)
Chloride: 103 mmol/L (ref 98–110)
Creat: 1.3 mg/dL (ref 0.70–1.33)
GFR, Est African American: 70 mL/min/{1.73_m2} (ref >=60)
GFR, Est Non African American: 60 mL/min/{1.73_m2} (ref >=60)
Globulin: 3.2 g/dL (calc) (ref 1.9–3.7)
Glucose, Bld: 79 mg/dL (ref 65–99)
Potassium: 4.5 mmol/L (ref 3.5–5.3)
Sodium: 139 mmol/L (ref 135–146)
Total Bilirubin: 0.4 mg/dL (ref 0.2–1.2)
Total Protein: 7.7 g/dL (ref 6.1–8.1)

## 2020-10-06 LAB — QUANTIFERON-TB GOLD PLUS
Mitogen-NIL: >10 IU/mL
NIL: 0.03 IU/mL
QuantiFERON-TB Gold Plus: NEGATIVE
TB1-NIL: 0 IU/mL
TB2-NIL: 0 IU/mL

## 2020-10-06 NOTE — Progress Notes (Signed)
TB gold negative

## 2020-10-07 ENCOUNTER — Other Ambulatory Visit: Payer: Self-pay | Admitting: Physician Assistant

## 2020-10-11 MED FILL — SIMPONI 50 MG/0.5ML SOSY: 50 | 28 days supply | Qty: 1 | Fill #2

## 2020-10-25 DIAGNOSIS — M62830 Muscle spasm of back: Secondary | ICD-10-CM | POA: Diagnosis not present

## 2020-10-25 DIAGNOSIS — M546 Pain in thoracic spine: Secondary | ICD-10-CM | POA: Diagnosis not present

## 2020-10-25 DIAGNOSIS — M5442 Lumbago with sciatica, left side: Secondary | ICD-10-CM | POA: Diagnosis not present

## 2020-10-25 DIAGNOSIS — M5136 Other intervertebral disc degeneration, lumbar region: Secondary | ICD-10-CM | POA: Diagnosis not present

## 2020-10-27 NOTE — Progress Notes (Signed)
Office Visit Note  Patient: Timothy Waters             Date of Birth: 16-Feb-1961           MRN: [id number]             PCP: Lonie Peak, PA-C Referring: Lonie Peak, PA-C Visit Date: 11/09/2020 Occupation: @GUAROCC @  Subjective:  Medication management.   History of Present Illness: Timothy Waters is a 60 y.o. male with history of rheumatoid arthritis.  He states he has been doing well on the combination of Arava and Simponi subcutaneous.  He denies any joint pain or joint swelling.  He notices some stiffness in his right index finger in the morning.  He denies any side effects from the medications.  He states sometimes he has some itching on his back which responds to the topical agents.  Activities of Daily Living:  Patient reports morning stiffness for 2 minutes.   Patient Denies nocturnal pain.  Difficulty dressing/grooming: Denies Difficulty climbing stairs: Denies Difficulty getting out of chair: Denies Difficulty using hands for taps, buttons, cutlery, and/or writing: Denies  Review of Systems  Constitutional: Negative for fatigue and night sweats.  HENT: Negative for mouth sores, mouth dryness and nose dryness.   Eyes: Negative for redness and dryness.  Respiratory: Negative for shortness of breath and difficulty breathing.   Cardiovascular: Negative for chest pain, palpitations, hypertension, irregular heartbeat and swelling in legs/feet.  Gastrointestinal: Negative for constipation and diarrhea.  Endocrine: Negative for increased urination.  Musculoskeletal: Positive for morning stiffness. Negative for arthralgias, joint pain, joint swelling, myalgias, muscle weakness, muscle tenderness and myalgias.  Skin: Negative for color change, rash, hair loss, nodules/bumps, skin tightness, ulcers and sensitivity to sunlight.  Allergic/Immunologic: Negative for susceptible to infections.  Neurological: Negative for dizziness, fainting, memory loss, night sweats and weakness (  ).  Hematological: Negative for swollen glands.  Psychiatric/Behavioral: Negative for depressed mood and sleep disturbance. The patient is not nervous/anxious.     PMFS History:  Patient Active Problem List   Diagnosis Date Noted  . Seasonal allergies 01/12/2020  . History of gastroesophageal reflux (GERD) 01/01/2017  . Rheumatoid arthritis involving multiple sites with positive rheumatoid factor (HCC) 11/08/2016  . High risk medication use 11/08/2016  . Vitiligo 11/08/2016    Past Medical History:  Diagnosis Date  . Allergy   . Arthritis     Family History  Problem Relation Age of Onset  . Diabetes Mother   . Cancer Mother   . COPD Father   . Diabetes Father    Past Surgical History:  Procedure Laterality Date  . APPENDECTOMY  11/17/2018  . CARPAL TUNNEL RELEASE Left 2002  . MANDIBLE SURGERY  1994   Social History   Social History Narrative  . Not on file   Immunization History  Administered Date(s) Administered  . Influenza,inj,Quad PF,6+ Mos 06/03/2017, 06/02/2018  . Moderna Sars-Covid-2 Vaccination 10/16/2019, 11/18/2019, 05/13/2020     Objective: Vital Signs: BP (!) 138/97 (BP Location: Left Arm, Patient Position: Sitting, Cuff Size: Normal)   Pulse 71   Ht 5\' 11"  (1.803 m)   Wt 193 lb 9.6 oz (87.8 kg)   BMI 27.00 kg/m    Physical Exam Vitals and nursing note reviewed.  Constitutional:      Appearance: He is well-developed.  HENT:     Head: Normocephalic and atraumatic.  Eyes:     Conjunctiva/sclera: Conjunctivae normal.     Pupils: Pupils are equal, round, and reactive  to light.  Cardiovascular:     Rate and Rhythm: Normal rate and regular rhythm.     Heart sounds: Normal heart sounds.  Pulmonary:     Effort: Pulmonary effort is normal.     Breath sounds: Normal breath sounds.  Abdominal:     General: Bowel sounds are normal.     Palpations: Abdomen is soft.  Musculoskeletal:     Cervical back: Normal range of motion and neck supple.   Skin:    General: Skin is warm and dry.     Capillary Refill: Capillary refill takes less than 2 seconds.  Neurological:     Mental Status: He is alert and oriented to person, place, and time.  Psychiatric:        Behavior: Behavior normal.      Musculoskeletal Exam: C-spine thoracic and lumbar spine with good range of motion.  He had no SI joint tenderness.  Shoulder joints, elbow joints, wrist joints, MCPs PIPs and DIPs with good range of motion with no synovitis.  Hip joints, knee joints, ankles, MTPs and PIPs with good range of motion with no synovitis.  CDAI Exam: CDAI Score: 0.2  Patient Global: 1 mm; Provider Global: 1 mm Swollen: 0 ; Tender: 0  Joint Exam 11/09/2020   No joint exam has been documented for this visit   There is currently no information documented on the homunculus. Go to the Rheumatology activity and complete the homunculus joint exam.  Investigation: No additional findings.  Imaging: No results found.  Recent Labs: Lab Results  Component Value Date   WBC 5.3 10/04/2020   HGB 14.1 10/04/2020   PLT 170 10/04/2020   NA 139 10/04/2020   K 4.5 10/04/2020   CL 103 10/04/2020   CO2 25 10/04/2020   GLUCOSE 79 10/04/2020   BUN 19 10/04/2020   CREATININE 1.30 10/04/2020   BILITOT 0.4 10/04/2020   AST 25 10/04/2020   ALT 21 10/04/2020   PROT 7.7 10/04/2020   CALCIUM 9.8 10/04/2020   GFRAA 70 10/04/2020   QFTBGOLDPLUS NEGATIVE 10/04/2020    Speciality Comments: Prior therapy includes: methotrexate (elevated creatinine), Arava ( rash in the past), Imuran (multiple SE's), and inadequate response to ENBREL, HUMIRA, ORENCIA, XELJANZ and SIMPONI ARIA.  Procedures:  No procedures performed Allergies: Sulfa antibiotics   Assessment / Plan:     Visit Diagnoses: Rheumatoid arthritis involving multiple sites with positive rheumatoid factor (HCC)-he is doing well on the combination therapy.  He denies any joint pain or joint swelling.  He states he has  some stiffness in the morning especially in his right index finger.  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  High risk medication use - Simponi 50 mg subcu every 28 days and Arava 10 mg 1 tablet daily.  Labs from October 03 2020 were within normal limits.  TB gold was negative on October 03, 2020.  He has been advised to get labs every 3 months to monitor for drug toxicity.  Vitiligo-he denies having any delusional lesions.  History of gastroesophageal reflux (GERD)-he has longstanding issues with reflux.  Seasonal allergies  Educated about COVID-19 virus infection-he has been advised to get COVID-19 vaccine first dose (booster) 3 months to 6 months after the third dose.  He was also advised to get pneumococcal vaccine and Shingrix vaccine.  He will discuss this further with his PCP.  Increased risk of heart disease with rheumatoid arthritis was discussed.  Dietary modifications and exercise was emphasized.  Orders:  No orders of the defined types were placed in this encounter.  No orders of the defined types were placed in this encounter.   Follow-Up Instructions: Return in about 5 months (around 04/11/2021) for Rheumatoid arthritis.   Pollyann Savoy, MD  Note - This record has been created using Animal nutritionist.  Chart creation errors have been sought, but may not always  have been located. Such creation errors do not reflect on  the standard of medical care.

## 2020-11-09 ENCOUNTER — Ambulatory Visit: Payer: 59 | Admitting: Rheumatology

## 2020-11-09 ENCOUNTER — Other Ambulatory Visit: Payer: Self-pay | Admitting: Internal Medicine

## 2020-11-09 ENCOUNTER — Other Ambulatory Visit: Payer: Self-pay | Admitting: Pharmacist

## 2020-11-09 ENCOUNTER — Encounter: Payer: Self-pay | Admitting: Rheumatology

## 2020-11-09 ENCOUNTER — Other Ambulatory Visit: Payer: Self-pay | Admitting: Rheumatology

## 2020-11-09 ENCOUNTER — Other Ambulatory Visit: Payer: Self-pay

## 2020-11-09 VITALS — BP 138/97 | HR 71 | Ht 71.0 in | Wt 193.6 lb

## 2020-11-09 DIAGNOSIS — L80 Vitiligo: Secondary | ICD-10-CM

## 2020-11-09 DIAGNOSIS — J302 Other seasonal allergic rhinitis: Secondary | ICD-10-CM

## 2020-11-09 DIAGNOSIS — M0579 Rheumatoid arthritis with rheumatoid factor of multiple sites without organ or systems involvement: Secondary | ICD-10-CM | POA: Diagnosis not present

## 2020-11-09 DIAGNOSIS — Z79899 Other long term (current) drug therapy: Secondary | ICD-10-CM | POA: Diagnosis not present

## 2020-11-09 DIAGNOSIS — M7711 Lateral epicondylitis, right elbow: Secondary | ICD-10-CM

## 2020-11-09 DIAGNOSIS — Z8719 Personal history of other diseases of the digestive system: Secondary | ICD-10-CM

## 2020-11-09 DIAGNOSIS — Z7189 Other specified counseling: Secondary | ICD-10-CM

## 2020-11-09 DIAGNOSIS — R7989 Other specified abnormal findings of blood chemistry: Secondary | ICD-10-CM

## 2020-11-09 MED ORDER — SIMPONI 50 MG/0.5ML ~~LOC~~ SOSY: PREFILLED_SYRINGE | SUBCUTANEOUS | 0 refills | Status: DC

## 2020-11-09 NOTE — Patient Instructions (Addendum)
Standing Labs We placed an order today for your standing lab work.   Please have your standing labs drawn in  May and every 3 months  If possible, please have your labs drawn 2 weeks prior to your appointment so that the provider can discuss your results at your appointment.  We have open lab daily Monday through Thursday from 1:30-4:30 PM and Friday from 1:30-4:00 PM at the office of Dr. Pollyann Savoy, [facility].   Please be advised, all patients with office appointments requiring lab work will take precedents over walk-in lab work.  If possible, please come for your lab work on Monday and Friday afternoons, as you may experience shorter wait times. The office is located at [address], [address], [place], [place] [address] No appointment is necessary.   Labs are drawn by [facility]. Please bring your co-pay at the time of your lab draw.  You may receive a bill from [facility] for your lab work.  If you wish to have your labs drawn at another location, please call the office 24 hours in advance to send orders.  If you have any questions regarding directions or hours of operation,  please call [phone].   As a reminder, please drink plenty of water prior to coming for your lab work. Thanks!   COVID-19 vaccine recommendations:   COVID-19 vaccine is recommended for everyone (unless you are allergic to a vaccine component), even if you are on a medication that suppresses your immune system.   Mentations are that patients on immunosuppressive therapy should receive first 3 COVID-19 vaccines 1 month apart and then fourth dose (booster) 3 months after the third dose.  Do not take Tylenol or any anti-inflammatory medications (NSAIDs) 24 hours prior to the COVID-19 vaccination.   There is no direct evidence about the efficacy of the COVID-19 vaccine in individuals who are on medications that suppress the immune system.   Even if you are fully vaccinated, and you are on any  medications that suppress your immune system, please continue to wear a mask, maintain at least six feet social distance and practice hand hygiene.   If you develop a COVID-19 infection, please contact your PCP or our office to determine if you need monoclonal antibody infusion.  The booster vaccine is now available for immunocompromised patients.   Please see the following web sites for updated information.   [email or web address]   Vaccines You are taking a medication(s) that can suppress your immune system.  The following immunizations are recommended: . Flu annually . Covid-19  . Pneumonia (Pneumovax 23 and Prevnar 13 spaced at least 1 year apart) . Shingrix (after age 48)  Please check with your PCP to make sure you are up to date.  Heart Disease Prevention   Your inflammatory disease increases your risk of heart disease which includes heart attack, stroke, atrial fibrillation (irregular heartbeats), high blood pressure, heart failure and atherosclerosis (plaque in the arteries).  It is important to reduce your risk by:   . Keep blood pressure, cholesterol, and blood sugar at healthy levels   . Smoking Cessation   . Maintain a healthy weight  o BMI 20-25   . Eat a healthy diet  o Plenty of fresh fruit, vegetables, and whole grains  o Limit saturated fats, foods high in sodium, and added sugars  o DASH and Mediterranean diet   . Increase physical activity  o Recommend moderate physically activity for 150 minutes per week/ 30 minutes a day for  five days a week These can be broken up into three separate ten-minute sessions during the day.   . Reduce Stress  . Meditation, slow breathing exercises, yoga, coloring books  . Dental visits twice a year

## 2020-11-09 NOTE — Telephone Encounter (Signed)
Requested medication (s) are due for refill today:yes  Requested medication (s) are on the active medication list: yes  Last refill: 10/11/2020  Future visit scheduled: no  Notes to clinic:  this refill cannot be delegated    Requested Prescriptions  Pending Prescriptions Disp Refills   SIMPONI 50 MG/0.5ML SOSY [Pharmacy Med Name: SIMPONI 50 MG/0.5ML SOSY 50 Solution Prefilled Syringe] 0.5 mL 1    Sig: INJECT 50MG  SUBCUTANEOUSLY MONTHLY      Not Delegated - Immunology: Tumor Necrosis Factor Blockers - adalimumab & golimumab Failed - 11/09/2020  9:07 AM      Failed - This refill cannot be delegated      Passed - ALT in normal range and within 180 days    ALT  Date Value Ref Range Status  10/04/2020 21 9 - 46 U/L Final          Passed - AST in normal range and within 180 days    AST  Date Value Ref Range Status  10/04/2020 25 10 - 35 U/L Final          Passed - HCT in normal range and within 180 days    HCT  Date Value Ref Range Status  10/04/2020 43.9 38.5 - 50.0 % Final          Passed - HGB in normal range and within 180 days    Hemoglobin  Date Value Ref Range Status  10/04/2020 14.1 13.2 - 17.1 g/dL Final          Passed - PLT in normal range and within 180 days    Platelets  Date Value Ref Range Status  10/04/2020 170 140 - 400 Thousand/uL Final          Passed - WBC in normal range and within 180 days    WBC  Date Value Ref Range Status  10/04/2020 5.3 3.8 - 10.8 Thousand/uL Final          Passed - Valid encounter within last 6 months    Recent Outpatient Visits           4 months ago Encounter for medication review   [facility] [place], KOOMBERKINE, RPH-CPP   7 years ago Conjunctivitis   [facility], Timmothy Euler, MD       Future Appointments             Today Kenyon Ana, MD [facility]

## 2020-11-09 NOTE — Telephone Encounter (Signed)
Next Visit:11/09/2020  Last Visit: 06/14/2020  Last Fill: 08/10/2020  VF:[id number] arthritis involving multiple sites with positive rheumatoid factor   Current Dose per office note 06/14/2020, Simponi 50 mg subcu every 28 days   Labs: 10/04/2020, CMP WNL. MCV and MCH are borderline low. RBC count, hgb, and hct are WNL. We will continue to monitor  TB Gold:  10/04/2020, TB gold negative.   Okay to refill simponi?

## 2020-11-11 ENCOUNTER — Other Ambulatory Visit (HOSPITAL_COMMUNITY): Payer: Self-pay

## 2020-11-14 DIAGNOSIS — Z23 Encounter for immunization: Secondary | ICD-10-CM | POA: Diagnosis not present

## 2020-11-15 MED FILL — SIMPONI 50 MG/0.5ML SOSY: 50 | 28 days supply | Qty: 1 | Fill #0

## 2020-12-01 ENCOUNTER — Telehealth: Payer: Self-pay | Admitting: Pharmacist

## 2020-12-01 DIAGNOSIS — M5136 Other intervertebral disc degeneration, lumbar region: Secondary | ICD-10-CM | POA: Diagnosis not present

## 2020-12-01 DIAGNOSIS — M5442 Lumbago with sciatica, left side: Secondary | ICD-10-CM | POA: Diagnosis not present

## 2020-12-01 DIAGNOSIS — M546 Pain in thoracic spine: Secondary | ICD-10-CM | POA: Diagnosis not present

## 2020-12-01 DIAGNOSIS — M62830 Muscle spasm of back: Secondary | ICD-10-CM | POA: Diagnosis not present

## 2020-12-01 NOTE — Telephone Encounter (Signed)
Received notification from [facility] regarding a prior authorization for [facility]. Authorization has been APPROVED from 11/30/20 to 11/29/21.   Authorization # [id number] [employer] Phone # [phone]  Chesley Mires, PharmD, MPH Clinical Pharmacist (Rheumatology and Pulmonology)

## 2020-12-07 ENCOUNTER — Other Ambulatory Visit: Payer: Self-pay | Admitting: Physician Assistant

## 2020-12-07 ENCOUNTER — Other Ambulatory Visit: Payer: Self-pay | Admitting: Pharmacist

## 2020-12-07 ENCOUNTER — Other Ambulatory Visit (HOSPITAL_COMMUNITY): Payer: Self-pay

## 2020-12-07 MED ORDER — SIMPONI 50 MG/0.5ML ~~LOC~~ SOSY
PREFILLED_SYRINGE | SUBCUTANEOUS | 2 refills | Status: DC
  Filled 2020-12-07: qty 0.5, fill #0

## 2020-12-07 MED ORDER — SIMPONI 50 MG/0.5ML ~~LOC~~ SOSY
PREFILLED_SYRINGE | SUBCUTANEOUS | 2 refills | Status: DC
  Filled 2020-12-07: qty 0.5, 28d supply, fill #0
  Filled 2021-01-09: qty 0.5, 28d supply, fill #1
  Filled 2021-01-31: qty 0.5, 28d supply, fill #2

## 2020-12-07 NOTE — Telephone Encounter (Signed)
Next Visit: 04/13/2021  Last Visit: 11/09/2020  Last Fill: 11/09/2020  DX: Rheumatoid arthritis involving multiple sites with positive rheumatoid factor   Current Dose per office note 11/09/2020, Simponi 50 mg subcu every 28 days   Labs: 10/04/2020, CMP WNL. MCV and MCH are borderline low. RBC count, hgb, and hct are WNL. We will continue to monitor.   TB Gold: 10/04/2020, negative  Okay to refill Simponi?

## 2020-12-13 ENCOUNTER — Other Ambulatory Visit (HOSPITAL_COMMUNITY): Payer: Self-pay

## 2020-12-13 DIAGNOSIS — Z6826 Body mass index (BMI) 26.0-26.9, adult: Secondary | ICD-10-CM | POA: Diagnosis not present

## 2020-12-13 DIAGNOSIS — J45991 Cough variant asthma: Secondary | ICD-10-CM | POA: Diagnosis not present

## 2020-12-13 DIAGNOSIS — K219 Gastro-esophageal reflux disease without esophagitis: Secondary | ICD-10-CM | POA: Diagnosis not present

## 2020-12-27 DIAGNOSIS — M5442 Lumbago with sciatica, left side: Secondary | ICD-10-CM | POA: Diagnosis not present

## 2020-12-27 DIAGNOSIS — M546 Pain in thoracic spine: Secondary | ICD-10-CM | POA: Diagnosis not present

## 2020-12-27 DIAGNOSIS — M5136 Other intervertebral disc degeneration, lumbar region: Secondary | ICD-10-CM | POA: Diagnosis not present

## 2020-12-27 DIAGNOSIS — M62830 Muscle spasm of back: Secondary | ICD-10-CM | POA: Diagnosis not present

## 2021-01-09 ENCOUNTER — Other Ambulatory Visit (HOSPITAL_COMMUNITY): Payer: Self-pay

## 2021-01-10 ENCOUNTER — Other Ambulatory Visit (HOSPITAL_COMMUNITY): Payer: Self-pay

## 2021-01-11 ENCOUNTER — Other Ambulatory Visit (HOSPITAL_COMMUNITY): Payer: Self-pay

## 2021-01-13 ENCOUNTER — Other Ambulatory Visit: Payer: Self-pay | Admitting: Physician Assistant

## 2021-01-13 ENCOUNTER — Other Ambulatory Visit (HOSPITAL_COMMUNITY): Payer: Self-pay

## 2021-01-13 MED ORDER — LEFLUNOMIDE 10 MG PO TABS
ORAL_TABLET | Freq: Every day | ORAL | 0 refills | Status: DC
Start: 2021-01-13 — End: 2021-04-05
  Filled 2021-01-13: qty 90, 90d supply, fill #0

## 2021-01-13 MED FILL — Folic Acid-Vitamin B6-Vitamin B12 Tab 2.2-25-1 MG: ORAL | 30 days supply | Qty: 60 | Fill #0 | Status: AC

## 2021-01-13 MED FILL — Famotidine Tab 40 MG: ORAL | 90 days supply | Qty: 90 | Fill #0 | Status: AC

## 2021-01-13 NOTE — Telephone Encounter (Signed)
Next Visit: 04/13/2021  Last Visit: 11/09/2020  Last Fill: 10/03/2020  DX:  Rheumatoid arthritis involving multiple sites with positive rheumatoid factor   Current Dose per office note 11/09/2020, Arava 10 mg 1 tablet daily  Labs: 10/04/2020, CMP WNL. MCV and MCH are borderline low. RBC count, hgb, and hct are WNL. We will continue to monitor.   I called patient, patient will in next week for labs.   Okay to refill Arava?

## 2021-01-17 ENCOUNTER — Other Ambulatory Visit: Payer: Self-pay

## 2021-01-17 ENCOUNTER — Other Ambulatory Visit (HOSPITAL_COMMUNITY): Payer: Self-pay

## 2021-01-17 DIAGNOSIS — Z79899 Other long term (current) drug therapy: Secondary | ICD-10-CM

## 2021-01-18 ENCOUNTER — Other Ambulatory Visit (HOSPITAL_COMMUNITY): Payer: Self-pay

## 2021-01-18 LAB — COMPLETE METABOLIC PANEL WITH GFR
AG Ratio: 1.5 (calc) (ref 1.0–2.5)
ALT: 23 U/L (ref 9–46)
AST: 26 U/L (ref 10–35)
Albumin: 4.6 g/dL (ref 3.6–5.1)
Alkaline phosphatase (APISO): 82 U/L (ref 35–144)
BUN: 16 mg/dL (ref 7–25)
CO2: 25 mmol/L (ref 20–32)
Calcium: 9.5 mg/dL (ref 8.6–10.3)
Chloride: 104 mmol/L (ref 98–110)
Creat: 1.33 mg/dL (ref 0.70–1.33)
GFR, Est African American: 68 mL/min/{1.73_m2} (ref >=60)
GFR, Est Non African American: 59 mL/min/{1.73_m2} — ABNORMAL LOW (ref >=60)
Globulin: 3.1 g/dL (calc) (ref 1.9–3.7)
Glucose, Bld: 89 mg/dL (ref 65–99)
Potassium: 3.6 mmol/L (ref 3.5–5.3)
Sodium: 139 mmol/L (ref 135–146)
Total Bilirubin: 0.4 mg/dL (ref 0.2–1.2)
Total Protein: 7.7 g/dL (ref 6.1–8.1)

## 2021-01-18 LAB — CBC WITH DIFFERENTIAL/PLATELET
Absolute Monocytes: 756 cells/uL (ref 200–950)
Basophils Absolute: 62 cells/uL (ref 0–200)
Basophils Relative: 1.1 %
Eosinophils Absolute: 638 cells/uL — ABNORMAL HIGH (ref 15–500)
Eosinophils Relative: 11.4 %
HCT: 43.4 % (ref 38.5–50.0)
Hemoglobin: 13.7 g/dL (ref 13.2–17.1)
Lymphs Abs: 1786 cells/uL (ref 850–3900)
MCH: 25 pg — ABNORMAL LOW (ref 27.0–33.0)
MCHC: 31.6 g/dL — ABNORMAL LOW (ref 32.0–36.0)
MCV: 79.3 fL — ABNORMAL LOW (ref 80.0–100.0)
MPV: 13 fL — ABNORMAL HIGH (ref 7.5–12.5)
Monocytes Relative: 13.5 %
Neutro Abs: 2358 cells/uL (ref 1500–7800)
Neutrophils Relative %: 42.1 %
Platelets: 157 10*3/uL (ref 140–400)
RBC: 5.47 10*6/uL (ref 4.20–5.80)
RDW: 13.3 % (ref 11.0–15.0)
Total Lymphocyte: 31.9 %
WBC: 5.6 10*3/uL (ref 3.8–10.8)

## 2021-01-18 NOTE — Progress Notes (Signed)
GFR is borderline low.  Creatinine WNL.  Rest of CMP WNL.   CBC stable.  We will continue to monitor.

## 2021-01-23 DIAGNOSIS — M5442 Lumbago with sciatica, left side: Secondary | ICD-10-CM | POA: Diagnosis not present

## 2021-01-23 DIAGNOSIS — M546 Pain in thoracic spine: Secondary | ICD-10-CM | POA: Diagnosis not present

## 2021-01-23 DIAGNOSIS — M5136 Other intervertebral disc degeneration, lumbar region: Secondary | ICD-10-CM | POA: Diagnosis not present

## 2021-01-23 DIAGNOSIS — M62830 Muscle spasm of back: Secondary | ICD-10-CM | POA: Diagnosis not present

## 2021-01-24 ENCOUNTER — Other Ambulatory Visit: Payer: Self-pay

## 2021-01-24 ENCOUNTER — Other Ambulatory Visit (HOSPITAL_COMMUNITY): Payer: Self-pay

## 2021-01-25 ENCOUNTER — Other Ambulatory Visit (HOSPITAL_COMMUNITY): Payer: Self-pay

## 2021-01-25 ENCOUNTER — Other Ambulatory Visit: Payer: Self-pay

## 2021-01-26 ENCOUNTER — Other Ambulatory Visit (HOSPITAL_COMMUNITY): Payer: Self-pay

## 2021-01-30 ENCOUNTER — Other Ambulatory Visit: Payer: Self-pay | Admitting: Rheumatology

## 2021-01-30 ENCOUNTER — Other Ambulatory Visit (HOSPITAL_COMMUNITY): Payer: Self-pay

## 2021-01-30 MED ORDER — OMEPRAZOLE 40 MG PO CPDR
1.0000 | DELAYED_RELEASE_CAPSULE | Freq: Every day | ORAL | 0 refills | Status: DC
  Filled 2021-01-30: qty 90, 90d supply, fill #0

## 2021-01-30 NOTE — Telephone Encounter (Signed)
Next Visit: 04/13/2021   Last Visit: 11/09/2020   Last Fill: 03/26/2018   Okay to refill Omeprazole?

## 2021-01-30 NOTE — Telephone Encounter (Signed)
Next Visit: 04/13/2021   Last Visit: 11/09/2020   Last Fill: 03/26/2018   Okay to refill Omeprazole?  

## 2021-01-31 ENCOUNTER — Other Ambulatory Visit (HOSPITAL_COMMUNITY): Payer: Self-pay

## 2021-01-31 MED ORDER — FLOVENT HFA 110 MCG/ACT IN AERO
INHALATION_SPRAY | RESPIRATORY_TRACT | 5 refills | Status: DC
  Filled 2021-01-31: qty 12, 30d supply, fill #0
  Filled 2021-03-05: qty 12, 30d supply, fill #1

## 2021-02-09 ENCOUNTER — Other Ambulatory Visit (HOSPITAL_COMMUNITY): Payer: Self-pay

## 2021-02-14 ENCOUNTER — Other Ambulatory Visit (HOSPITAL_COMMUNITY): Payer: Self-pay

## 2021-02-14 ENCOUNTER — Telehealth: Payer: Self-pay

## 2021-02-14 MED ORDER — SYSTANE ULTRA 0.4-0.3 % OP SOLN
1.0000 [drp] | Freq: Three times a day (TID) | OPHTHALMIC | 6 refills | Status: DC
  Filled 2021-02-14: qty 20, 30d supply, fill #0
  Filled 2021-12-07: qty 20, fill #0
  Filled 2021-12-21: qty 20, 60d supply, fill #0

## 2021-02-14 NOTE — Telephone Encounter (Signed)
Patient called and requested a prescription for eye drops due to his eyes feeling "gritty". I advised patient to contact his eye doctor in regards to that prescription. Patient verbalized understanding.

## 2021-02-21 ENCOUNTER — Other Ambulatory Visit (HOSPITAL_COMMUNITY): Payer: Self-pay

## 2021-02-22 DIAGNOSIS — M62830 Muscle spasm of back: Secondary | ICD-10-CM | POA: Diagnosis not present

## 2021-02-22 DIAGNOSIS — M5442 Lumbago with sciatica, left side: Secondary | ICD-10-CM | POA: Diagnosis not present

## 2021-02-22 DIAGNOSIS — M5136 Other intervertebral disc degeneration, lumbar region: Secondary | ICD-10-CM | POA: Diagnosis not present

## 2021-02-22 DIAGNOSIS — M546 Pain in thoracic spine: Secondary | ICD-10-CM | POA: Diagnosis not present

## 2021-03-06 ENCOUNTER — Other Ambulatory Visit: Payer: Self-pay | Admitting: Physician Assistant

## 2021-03-06 ENCOUNTER — Other Ambulatory Visit (HOSPITAL_COMMUNITY): Payer: Self-pay

## 2021-03-06 MED ORDER — SIMPONI 50 MG/0.5ML ~~LOC~~ SOSY
PREFILLED_SYRINGE | SUBCUTANEOUS | 2 refills | Status: DC
  Filled 2021-03-06: qty 0.5, fill #0

## 2021-03-06 NOTE — Telephone Encounter (Signed)
Next Visit: 04/13/2021  Last Visit: 11/09/2020  Last Fill: 12/07/2020  NO:[id number] arthritis involving multiple sites with positive rheumatoid factor  Current Dose per office note 11/09/2020: Simponi 50 mg subcu every 28 days  Labs: 01/17/2021 GFR is borderline low.  Creatinine WNL.  Rest of CMP WNL.   CBC stable.   TB Gold: 10/04/2020 Neg.    Okay to refill Simponi?

## 2021-03-07 ENCOUNTER — Other Ambulatory Visit: Payer: Self-pay | Admitting: Pharmacist

## 2021-03-07 ENCOUNTER — Other Ambulatory Visit (HOSPITAL_COMMUNITY): Payer: Self-pay

## 2021-03-07 MED ORDER — SIMPONI 50 MG/0.5ML ~~LOC~~ SOSY
PREFILLED_SYRINGE | SUBCUTANEOUS | 2 refills | Status: DC
  Filled 2021-03-07: qty 0.5, 28d supply, fill #0
  Filled 2021-04-06: qty 0.5, 28d supply, fill #1
  Filled 2021-05-03: qty 0.5, 28d supply, fill #2

## 2021-03-13 ENCOUNTER — Other Ambulatory Visit (HOSPITAL_COMMUNITY): Payer: Self-pay

## 2021-03-15 ENCOUNTER — Other Ambulatory Visit (HOSPITAL_COMMUNITY): Payer: Self-pay

## 2021-03-15 DIAGNOSIS — M25512 Pain in left shoulder: Secondary | ICD-10-CM | POA: Diagnosis not present

## 2021-03-15 DIAGNOSIS — J45991 Cough variant asthma: Secondary | ICD-10-CM | POA: Diagnosis not present

## 2021-03-15 DIAGNOSIS — L723 Sebaceous cyst: Secondary | ICD-10-CM | POA: Diagnosis not present

## 2021-03-15 DIAGNOSIS — M069 Rheumatoid arthritis, unspecified: Secondary | ICD-10-CM | POA: Diagnosis not present

## 2021-03-15 DIAGNOSIS — Z6826 Body mass index (BMI) 26.0-26.9, adult: Secondary | ICD-10-CM | POA: Diagnosis not present

## 2021-03-15 MED ORDER — FLUTICASONE PROPIONATE HFA 220 MCG/ACT IN AERO
INHALATION_SPRAY | RESPIRATORY_TRACT | 5 refills | Status: DC
  Filled 2021-03-15: qty 12, 30d supply, fill #0
  Filled 2021-04-18: qty 12, 30d supply, fill #1
  Filled 2021-05-17: qty 12, 30d supply, fill #2
  Filled 2021-06-13: qty 12, 30d supply, fill #3
  Filled 2021-07-20: qty 12, 30d supply, fill #4
  Filled 2021-08-16: qty 12, 30d supply, fill #5

## 2021-03-17 ENCOUNTER — Other Ambulatory Visit (HOSPITAL_COMMUNITY): Payer: Self-pay

## 2021-03-22 DIAGNOSIS — M5442 Lumbago with sciatica, left side: Secondary | ICD-10-CM | POA: Diagnosis not present

## 2021-03-22 DIAGNOSIS — M546 Pain in thoracic spine: Secondary | ICD-10-CM | POA: Diagnosis not present

## 2021-03-22 DIAGNOSIS — M5136 Other intervertebral disc degeneration, lumbar region: Secondary | ICD-10-CM | POA: Diagnosis not present

## 2021-03-22 DIAGNOSIS — M62830 Muscle spasm of back: Secondary | ICD-10-CM | POA: Diagnosis not present

## 2021-03-31 NOTE — Progress Notes (Signed)
Office Visit Note  Patient: Timothy Waters             Date of Birth: May 30, 1961           MRN: [id number]             PCP: Lonie Peak, PA-C Referring: Lonie Peak, PA-C Visit Date: 04/13/2021 Occupation: @GUAROCC @  Subjective:  Medication management.   History of Present Illness: Timothy Waters is a 60 y.o. male with history of rheumatoid arthritis.  He states he has been doing well on Simponi and Arava combination.  He states he had 2 episodes of shoulder joint pain and stiffness when he delayed Simponi injection by 1 week.  He denies any joint pain or joint swelling today.  He states he has developed some patchy hair loss on top of his scalp and he has an appointment coming up with the dermatologist.  Activities of Daily Living:  Patient reports morning stiffness for  0 minutes.   Patient Denies nocturnal pain.  Difficulty dressing/grooming: Denies Difficulty climbing stairs: Denies Difficulty getting out of chair: Denies Difficulty using hands for taps, buttons, cutlery, and/or writing: Denies  Review of Systems  Constitutional:  Negative for fatigue.  HENT:  Negative for mouth sores, mouth dryness and nose dryness.   Eyes:  Positive for dryness. Negative for pain and itching.  Respiratory:  Negative for shortness of breath and difficulty breathing.   Cardiovascular:  Negative for chest pain and palpitations.  Gastrointestinal:  Negative for blood in stool, constipation and diarrhea.  Endocrine: Negative for increased urination.  Genitourinary:  Negative for difficulty urinating.  Musculoskeletal:  Negative for joint pain, joint pain, joint swelling, myalgias, morning stiffness, muscle tenderness and myalgias.  Skin:  Negative for color change, rash and redness.  Allergic/Immunologic: Negative for susceptible to infections.  Neurological:  Negative for dizziness, numbness, headaches, memory loss and weakness.  Hematological:  Negative for bruising/bleeding tendency.   Psychiatric/Behavioral:  Negative for confusion.    PMFS History:  Patient Active Problem List   Diagnosis Date Noted   Seasonal allergies 01/12/2020   History of gastroesophageal reflux (GERD) 01/01/2017   Rheumatoid arthritis involving multiple sites with positive rheumatoid factor (HCC) 11/08/2016   High risk medication use 11/08/2016   Vitiligo 11/08/2016    Past Medical History:  Diagnosis Date   Allergy    Arthritis     Family History  Problem Relation Age of Onset   Diabetes Mother    Cancer Mother    COPD Father    Diabetes Father    Past Surgical History:  Procedure Laterality Date   APPENDECTOMY  11/17/2018   CARPAL TUNNEL RELEASE Left 2002   MANDIBLE SURGERY  1994   Social History   Social History Narrative   Not on file   Immunization History  Administered Date(s) Administered   Influenza,inj,Quad PF,6+ Mos 06/03/2017, 06/02/2018   Moderna Sars-Covid-2 Vaccination 10/16/2019, 11/18/2019, 05/13/2020, 03/03/2021     Objective: Vital Signs: BP (!) 136/92 (BP Location: Left Arm, Patient Position: Sitting, Cuff Size: Normal)   Pulse 74   Ht 5\' 11"  (1.803 m)   Wt 188 lb (85.3 kg)   BMI 26.22 kg/m    Physical Exam Vitals and nursing note reviewed.  Constitutional:      Appearance: He is well-developed.  HENT:     Head: Normocephalic and atraumatic.  Eyes:     Conjunctiva/sclera: Conjunctivae normal.     Pupils: Pupils are equal, round, and reactive to light.  Cardiovascular:  Rate and Rhythm: Normal rate and regular rhythm.     Heart sounds: Normal heart sounds.  Pulmonary:     Effort: Pulmonary effort is normal.     Breath sounds: Normal breath sounds.  Abdominal:     General: Bowel sounds are normal.     Palpations: Abdomen is soft.  Musculoskeletal:     Cervical back: Normal range of motion and neck supple.  Skin:    General: Skin is warm and dry.     Capillary Refill: Capillary refill takes less than 2 seconds.     Comments: Patchy  hair loss on top of his scalp with scales was noted.  Neurological:     Mental Status: He is alert and oriented to person, place, and time.  Psychiatric:        Behavior: Behavior normal.     Musculoskeletal Exam: C-spine thoracic and lumbar spine were in good range of motion.  Shoulder joints, elbow joints, wrist joints, MCPs PIPs and DIPs with good range of motion with no synovitis.  Hip joints, knee joints, ankles, MTPs and PIPs with good range of motion with no synovitis.  CDAI Exam: CDAI Score: 0  Patient Global: 0 mm; Provider Global: 0 mm Swollen: 0 ; Tender: 0  Joint Exam 04/13/2021   No joint exam has been documented for this visit   There is currently no information documented on the homunculus. Go to the Rheumatology activity and complete the homunculus joint exam.  Investigation: No additional findings.  Imaging: No results found.  Recent Labs: Lab Results  Component Value Date   WBC 5.6 01/17/2021   HGB 13.7 01/17/2021   PLT 157 01/17/2021   NA 139 01/17/2021   K 3.6 01/17/2021   CL 104 01/17/2021   CO2 25 01/17/2021   GLUCOSE 89 01/17/2021   BUN 16 01/17/2021   CREATININE 1.33 01/17/2021   BILITOT 0.4 01/17/2021   AST 26 01/17/2021   ALT 23 01/17/2021   PROT 7.7 01/17/2021   CALCIUM 9.5 01/17/2021   GFRAA 68 01/17/2021   QFTBGOLDPLUS NEGATIVE 10/04/2020    Speciality Comments: Prior therapy includes: methotrexate (elevated creatinine), Arava ( rash in the past), Imuran (multiple SE's), and inadequate response to ENBREL, HUMIRA, ORENCIA, XELJANZ and SIMPONI ARIA.  Procedures:  No procedures performed Allergies: Sulfa antibiotics   Assessment / Plan:     Visit Diagnoses: Rheumatoid arthritis involving multiple sites with positive rheumatoid factor (HCC)-he is clinically doing well without any synovitis on my examination today.  He had 2 episodes of left shoulder joint pain.  He delayed Simponi injection by 1 week.  He has been taking Simponi injection  once a month and also Arava 10 mg p.o. daily.  High risk medication use - Simponi 50 mg subcu every 28 days and Arava 10 mg 1 tablet daily.  Labs from Jan 17, 2021 were reviewed which were within normal limits.  He plans to get labs again this month and then every 3 months to monitor for drug toxicity.  TB gold is due in February 2023.  Updated information regarding realization was placed in the chart.  He was also advised to stop Simponi and Arava in case he develops an infection and resume after infection resolves.  He was also advised to have an annual skin examination to screen for nonmelanoma skin cancer while he is taking Simponi.  Vitiligo-he has vitiligo lesions which have spread some more.  Hair loss-he has a patch on his scalp with hair loss  and scales.  Raises concern about possible tinea capitis.  Has an appointment coming up with the dermatologist.  History of gastroesophageal reflux (GERD)-he is on Pepcid and omeprazole.  Seasonal allergies  Orders: No orders of the defined types were placed in this encounter.  No orders of the defined types were placed in this encounter.    Follow-Up Instructions: Return in about 5 months (around 09/13/2021) for Rheumatoid arthritis.   Pollyann Savoy, MD  Note - This record has been created using Animal nutritionist.  Chart creation errors have been sought, but may not always  have been located. Such creation errors do not reflect on  the standard of medical care.

## 2021-04-05 ENCOUNTER — Other Ambulatory Visit (HOSPITAL_COMMUNITY): Payer: Self-pay

## 2021-04-05 ENCOUNTER — Other Ambulatory Visit: Payer: Self-pay | Admitting: Physician Assistant

## 2021-04-05 MED ORDER — LEFLUNOMIDE 10 MG PO TABS
ORAL_TABLET | Freq: Every day | ORAL | 0 refills | Status: DC
  Filled 2021-04-05: qty 90, 90d supply, fill #0

## 2021-04-05 NOTE — Telephone Encounter (Signed)
Next Visit: 04/13/2021  Last Visit: 11/10/2031  Last Fill: 01/13/2021  DX: Rheumatoid arthritis involving multiple sites with positive rheumatoid factor   Current Dose per office note 11/09/2020: Arava 10 mg 1 tablet daily  Labs: 01/17/2021, GFR is borderline low.  Creatinine WNL.  Rest of CMP WNL.   CBC stable.  We will continue to monitor.   Okay to refill Arava?

## 2021-04-06 ENCOUNTER — Other Ambulatory Visit (HOSPITAL_COMMUNITY): Payer: Self-pay

## 2021-04-06 MED FILL — Famotidine Tab 40 MG: ORAL | 90 days supply | Qty: 90 | Fill #1 | Status: AC

## 2021-04-07 ENCOUNTER — Other Ambulatory Visit (HOSPITAL_COMMUNITY): Payer: Self-pay

## 2021-04-10 ENCOUNTER — Other Ambulatory Visit (HOSPITAL_COMMUNITY): Payer: Self-pay

## 2021-04-10 ENCOUNTER — Other Ambulatory Visit: Payer: Self-pay | Admitting: Rheumatology

## 2021-04-10 NOTE — Telephone Encounter (Signed)
Next Visit: 04/13/2021   Last Visit: 11/09/2020   Okay to refill Folic Acid?

## 2021-04-11 ENCOUNTER — Other Ambulatory Visit (HOSPITAL_COMMUNITY): Payer: Self-pay

## 2021-04-11 MED ORDER — FOLIC ACID 1 MG PO TABS
2.0000 mg | ORAL_TABLET | Freq: Every day | ORAL | 3 refills | Status: DC
  Filled 2021-04-11: qty 180, 90d supply, fill #0
  Filled 2021-06-28: qty 180, 90d supply, fill #1
  Filled 2021-09-30: qty 180, 90d supply, fill #2
  Filled 2021-12-30: qty 180, 90d supply, fill #3

## 2021-04-13 ENCOUNTER — Encounter: Payer: Self-pay | Admitting: Rheumatology

## 2021-04-13 ENCOUNTER — Other Ambulatory Visit: Payer: Self-pay

## 2021-04-13 ENCOUNTER — Ambulatory Visit: Payer: 59 | Admitting: Rheumatology

## 2021-04-13 VITALS — BP 136/92 | HR 74 | Ht 71.0 in | Wt 188.0 lb

## 2021-04-13 DIAGNOSIS — L659 Nonscarring hair loss, unspecified: Secondary | ICD-10-CM | POA: Diagnosis not present

## 2021-04-13 DIAGNOSIS — Z79899 Other long term (current) drug therapy: Secondary | ICD-10-CM | POA: Diagnosis not present

## 2021-04-13 DIAGNOSIS — Z8719 Personal history of other diseases of the digestive system: Secondary | ICD-10-CM | POA: Diagnosis not present

## 2021-04-13 DIAGNOSIS — M0579 Rheumatoid arthritis with rheumatoid factor of multiple sites without organ or systems involvement: Secondary | ICD-10-CM

## 2021-04-13 DIAGNOSIS — L80 Vitiligo: Secondary | ICD-10-CM

## 2021-04-13 DIAGNOSIS — J302 Other seasonal allergic rhinitis: Secondary | ICD-10-CM

## 2021-04-13 NOTE — Patient Instructions (Addendum)
Standing Labs We placed an order today for your standing lab work.   Please have your standing labs drawn in August and every 3 months  If possible, please have your labs drawn 2 weeks prior to your appointment so that the provider can discuss your results at your appointment.  Please note that you may see your imaging and lab results in MyChart before we have reviewed them. We may be awaiting multiple results to interpret others before contacting you. Please allow our office up to 72 hours to thoroughly review all of the results before contacting the office for clarification of your results.  We have open lab daily: Monday through Thursday from 1:30-4:30 PM and Friday from 1:30-4:00 PM at the office of Dr. Pollyann Savoy, [facility].   Please be advised, all patients with office appointments requiring lab work will take precedent over walk-in lab work.  If possible, please come for your lab work on Monday and Friday afternoons, as you may experience shorter wait times. The office is located at [address], [place], [place] [address] No appointment is necessary.   Labs are drawn by [facility]. Please bring your co-pay at the time of your lab draw.  You may receive a bill from [facility] for your lab work.  If you wish to have your labs drawn at another location, please call the office 24 hours in advance to send orders.  If you have any questions regarding directions or hours of operation,  please call [phone].   As a reminder, please drink plenty of water prior to coming for your lab work. Thanks!   Vaccines You are taking a medication(s) that can suppress your immune system.  The following immunizations are recommended: Flu annually Covid-19  Td/Tdap (tetanus, diphtheria, pertussis) every 10 years Pneumonia (Prevnar 15 then Pneumovax 23 at least 1 year apart.  Alternatively, can take Prevnar 20 without needing additional dose) Shingrix (after age 83): 2  doses from 4 weeks to 6 months apart  Please check with your PCP to make sure you are up to date.   If you test POSITIVE for COVID19 and have MILD to MODERATE symptoms: First, call your PCP if you would like to receive COVID19 treatment AND Hold your medications during the infection and for at least 1 week after your symptoms have resolved: Injectable medication (Benlysta, Cimzia, Cosentyx, Enbrel, Humira, Orencia, Remicade, Simponi, Stelara, Taltz, Tremfya) Methotrexate Leflunomide (Arava) Azathioprine Mycophenolate (Cellcept) Osborne O[place], or Rinvoq Otezla If you take Actemra or Kevzara, you DO NOT need to hold these for COVID19 infection.  If you test POSITIVE for COVID19 and have NO symptoms: First, call your PCP if you would like to receive COVID19 treatment AND Hold your medications for at least 10 days after the day that you tested positive Injectable medication (Benlysta, Cimzia, Cosentyx, Enbrel, Humira, Orencia, Remicade, Simponi, Stelara, Taltz, Tremfya) Methotrexate Leflunomide (Arava) Azathioprine Mycophenolate (Cellcept) Osborne [place], or Rinvoq Otezla If you take Actemra or Kevzara, you DO NOT need to hold these for COVID19 infection.  If you have signs or symptoms of an infection or start antibiotics: First, call your PCP for workup of your infection. Hold your medication through the infection, until you complete your antibiotics, and until symptoms resolve if you take the following: Injectable medication (Actemra, Benlysta, Cimzia, Cosentyx, Enbrel, Humira, Kevzara, Orencia, Remicade, Simponi, Stelara, Taltz, Tremfya) Methotrexate Leflunomide (Arava) Mycophenolate (Cellcept) Osborne O[place], or Rinvoq   Please schedule an annual skin examination with a dermatologist to screen  for nonmelanoma skin cancer while you are on Simponi.  Heart Disease Prevention   Your inflammatory disease increases your risk of heart disease which includes heart attack,  stroke, atrial fibrillation (irregular heartbeats), high blood pressure, heart failure and atherosclerosis (plaque in the arteries).  It is important to reduce your risk by:   Keep blood pressure, cholesterol, and blood sugar at healthy levels   Smoking Cessation   Maintain a healthy weight  BMI 20-25   Eat a healthy diet  Plenty of fresh fruit, vegetables, and whole grains  Limit saturated fats, foods high in sodium, and added sugars  DASH and Mediterranean diet   Increase physical activity  Recommend moderate physically activity for 150 minutes per week/ 30 minutes a day for five days a week These can be broken up into three separate ten-minute sessions during the day.   Reduce Stress  Meditation, slow breathing exercises, yoga, coloring books  Dental visits twice a year

## 2021-04-18 ENCOUNTER — Other Ambulatory Visit (HOSPITAL_COMMUNITY): Payer: Self-pay

## 2021-04-18 DIAGNOSIS — L638 Other alopecia areata: Secondary | ICD-10-CM | POA: Diagnosis not present

## 2021-04-18 MED ORDER — FLUOCINONIDE 0.05 % EX SOLN
CUTANEOUS | 2 refills | Status: DC
  Filled 2021-04-18: qty 60, 30d supply, fill #0

## 2021-04-19 ENCOUNTER — Other Ambulatory Visit (HOSPITAL_COMMUNITY): Payer: Self-pay

## 2021-04-21 ENCOUNTER — Other Ambulatory Visit (HOSPITAL_BASED_OUTPATIENT_CLINIC_OR_DEPARTMENT_OTHER): Payer: Self-pay

## 2021-04-27 ENCOUNTER — Other Ambulatory Visit: Payer: Self-pay | Admitting: *Deleted

## 2021-04-27 DIAGNOSIS — Z79899 Other long term (current) drug therapy: Secondary | ICD-10-CM

## 2021-04-27 LAB — COMPLETE METABOLIC PANEL WITH GFR
AG Ratio: 1.4 (calc) (ref 1.0–2.5)
ALT: 18 U/L (ref 9–46)
AST: 21 U/L (ref 10–35)
Albumin: 4.6 g/dL (ref 3.6–5.1)
Alkaline phosphatase (APISO): 70 U/L (ref 35–144)
BUN/Creatinine Ratio: 12 (calc) (ref 6–22)
BUN: 17 mg/dL (ref 7–25)
CO2: 30 mmol/L (ref 20–32)
Calcium: 9.8 mg/dL (ref 8.6–10.3)
Chloride: 101 mmol/L (ref 98–110)
Creat: 1.41 mg/dL — ABNORMAL HIGH (ref 0.70–1.30)
Globulin: 3.2 g/dL (calc) (ref 1.9–3.7)
Glucose, Bld: 92 mg/dL (ref 65–99)
Potassium: 4.2 mmol/L (ref 3.5–5.3)
Sodium: 139 mmol/L (ref 135–146)
Total Bilirubin: 0.5 mg/dL (ref 0.2–1.2)
Total Protein: 7.8 g/dL (ref 6.1–8.1)
eGFR: 57 mL/min/{1.73_m2} — ABNORMAL LOW (ref >=60)

## 2021-04-27 LAB — CBC WITH DIFFERENTIAL/PLATELET
Absolute Monocytes: 868 cells/uL (ref 200–950)
Basophils Absolute: 73 cells/uL (ref 0–200)
Basophils Relative: 1.4 %
Eosinophils Absolute: 426 cells/uL (ref 15–500)
Eosinophils Relative: 8.2 %
HCT: 45.1 % (ref 38.5–50.0)
Hemoglobin: 14.2 g/dL (ref 13.2–17.1)
Lymphs Abs: 1846 cells/uL (ref 850–3900)
MCH: 25.2 pg — ABNORMAL LOW (ref 27.0–33.0)
MCHC: 31.5 g/dL — ABNORMAL LOW (ref 32.0–36.0)
MCV: 80.1 fL (ref 80.0–100.0)
MPV: 13.2 fL — ABNORMAL HIGH (ref 7.5–12.5)
Monocytes Relative: 16.7 %
Neutro Abs: 1986 cells/uL (ref 1500–7800)
Neutrophils Relative %: 38.2 %
Platelets: 163 10*3/uL (ref 140–400)
RBC: 5.63 10*6/uL (ref 4.20–5.80)
RDW: 13.7 % (ref 11.0–15.0)
Total Lymphocyte: 35.5 %
WBC: 5.2 10*3/uL (ref 3.8–10.8)

## 2021-04-28 NOTE — Progress Notes (Signed)
Creatinine is elevated-1.41 and eGFR is borderline low-57.  Please advise the patient to avoid NSAID use. Rest of CMP WNL.  CBC stable.

## 2021-04-29 ENCOUNTER — Other Ambulatory Visit: Payer: Self-pay | Admitting: Rheumatology

## 2021-04-29 ENCOUNTER — Other Ambulatory Visit (HOSPITAL_COMMUNITY): Payer: Self-pay

## 2021-05-01 ENCOUNTER — Other Ambulatory Visit (HOSPITAL_COMMUNITY): Payer: Self-pay

## 2021-05-01 MED ORDER — OMEPRAZOLE 40 MG PO CPDR
40.0000 mg | DELAYED_RELEASE_CAPSULE | Freq: Every day | ORAL | 0 refills | Status: DC
  Filled 2021-05-01: qty 90, 90d supply, fill #0

## 2021-05-01 NOTE — Telephone Encounter (Signed)
Next Visit: 09/19/2021   Last Visit: 04/13/2021  Last Fill: 01/30/2021  Dx: History of gastroesophageal reflux   Current Dose per office note on 04/13/2021: he is on Pepcid and omeprazole.    Okay to refill omeprazole?

## 2021-05-02 DIAGNOSIS — M62830 Muscle spasm of back: Secondary | ICD-10-CM | POA: Diagnosis not present

## 2021-05-02 DIAGNOSIS — M546 Pain in thoracic spine: Secondary | ICD-10-CM | POA: Diagnosis not present

## 2021-05-02 DIAGNOSIS — M5442 Lumbago with sciatica, left side: Secondary | ICD-10-CM | POA: Diagnosis not present

## 2021-05-02 DIAGNOSIS — M5136 Other intervertebral disc degeneration, lumbar region: Secondary | ICD-10-CM | POA: Diagnosis not present

## 2021-05-03 ENCOUNTER — Other Ambulatory Visit (HOSPITAL_COMMUNITY): Payer: Self-pay

## 2021-05-04 ENCOUNTER — Other Ambulatory Visit (HOSPITAL_COMMUNITY): Payer: Self-pay

## 2021-05-19 ENCOUNTER — Other Ambulatory Visit (HOSPITAL_COMMUNITY): Payer: Self-pay

## 2021-05-30 ENCOUNTER — Other Ambulatory Visit (HOSPITAL_COMMUNITY): Payer: Self-pay

## 2021-05-30 DIAGNOSIS — L638 Other alopecia areata: Secondary | ICD-10-CM | POA: Diagnosis not present

## 2021-05-30 MED ORDER — FLUOCINONIDE 0.05 % EX SOLN
Freq: Two times a day (BID) | CUTANEOUS | 3 refills | Status: DC
  Filled 2021-05-30 – 2021-05-31 (×2): qty 60, 30d supply, fill #0

## 2021-05-31 ENCOUNTER — Other Ambulatory Visit: Payer: Self-pay

## 2021-05-31 ENCOUNTER — Ambulatory Visit: Payer: 59 | Admitting: Rheumatology

## 2021-05-31 ENCOUNTER — Other Ambulatory Visit: Payer: Self-pay | Admitting: Physician Assistant

## 2021-05-31 ENCOUNTER — Encounter: Payer: Self-pay | Admitting: Rheumatology

## 2021-05-31 ENCOUNTER — Other Ambulatory Visit (HOSPITAL_COMMUNITY): Payer: Self-pay

## 2021-05-31 VITALS — BP 142/93 | HR 80 | Ht 71.0 in | Wt 187.6 lb

## 2021-05-31 DIAGNOSIS — Z79899 Other long term (current) drug therapy: Secondary | ICD-10-CM | POA: Diagnosis not present

## 2021-05-31 DIAGNOSIS — L80 Vitiligo: Secondary | ICD-10-CM

## 2021-05-31 DIAGNOSIS — M0579 Rheumatoid arthritis with rheumatoid factor of multiple sites without organ or systems involvement: Secondary | ICD-10-CM | POA: Diagnosis not present

## 2021-05-31 DIAGNOSIS — R030 Elevated blood-pressure reading, without diagnosis of hypertension: Secondary | ICD-10-CM

## 2021-05-31 DIAGNOSIS — J302 Other seasonal allergic rhinitis: Secondary | ICD-10-CM | POA: Diagnosis not present

## 2021-05-31 DIAGNOSIS — Z8719 Personal history of other diseases of the digestive system: Secondary | ICD-10-CM

## 2021-05-31 DIAGNOSIS — M778 Other enthesopathies, not elsewhere classified: Secondary | ICD-10-CM | POA: Diagnosis not present

## 2021-05-31 DIAGNOSIS — L659 Nonscarring hair loss, unspecified: Secondary | ICD-10-CM

## 2021-05-31 MED ORDER — PREDNISONE 5 MG PO TABS
ORAL_TABLET | ORAL | 0 refills | Status: DC
Start: 2021-05-31 — End: 2021-09-19

## 2021-05-31 MED ORDER — SIMPONI 50 MG/0.5ML ~~LOC~~ SOSY
PREFILLED_SYRINGE | SUBCUTANEOUS | 2 refills | Status: DC
Start: 2021-05-31 — End: 2021-06-01
  Filled 2021-05-31: qty 0.5, fill #0

## 2021-05-31 NOTE — Progress Notes (Signed)
Office Visit Note  Patient: Timothy Waters             Date of Birth: [birth year]           MRN: [id number]             PCP: Lonie Peak, PA-C Referring: Lonie Peak, PA-C Visit Date: 05/31/2021 Occupation: @GUAROCC @  Subjective:  Other (Right hand/wrist pain )   History of Present Illness: Timothy Waters is a 60 y.o. male with a history of rheumatoid arthritis.  He states he has been taking Simponi injections every 4 weeks now on a routine basis.  He was trying to space his little bit more previously which was causing shoulder joint discomfort.  He states yesterday after working all day he started experiencing some discomfort and stiffness in his right hand and difficulty making a fist.  Over the next few hours the pain got intense and he had more discomfort at nighttime.  He tried topical steroids and also Biofreeze without much relief.  He states today he did not use his hand much and he is feeling some improvement but he is a still having difficulty making a fist and discomfort in his palmar region and right wrist joint.  None of the other joints are painful.   Activities of Daily Living:  Patient reports morning stiffness for 0 minutes.   Patient Denies nocturnal pain.  Difficulty dressing/grooming: Denies Difficulty climbing stairs: Denies Difficulty getting out of chair: Denies Difficulty using hands for taps, buttons, cutlery, and/or writing: Reports  Review of Systems  Constitutional:  Negative for fatigue.  HENT:  Negative for mouth sores, mouth dryness and nose dryness.   Eyes:  Negative for pain, itching and dryness.  Respiratory:  Negative for shortness of breath and difficulty breathing.   Cardiovascular:  Negative for chest pain and palpitations.  Gastrointestinal:  Negative for blood in stool, constipation and diarrhea.  Endocrine: Negative for increased urination.  Genitourinary:  Negative for difficulty urinating.  Musculoskeletal:  Positive for joint pain,  joint pain and joint swelling. Negative for myalgias, morning stiffness, muscle tenderness and myalgias.  Skin:  Negative for color change, rash and redness.  Allergic/Immunologic: Negative for susceptible to infections.  Neurological:  Negative for dizziness, numbness, headaches, memory loss and weakness.  Hematological:  Negative for bruising/bleeding tendency.  Psychiatric/Behavioral:  Negative for confusion.    PMFS History:  Patient Active Problem List   Diagnosis Date Noted   Seasonal allergies 01/12/2020   History of gastroesophageal reflux (GERD) 01/01/2017   Rheumatoid arthritis involving multiple sites with positive rheumatoid factor (HCC) 11/08/2016   High risk medication use 11/08/2016   Vitiligo 11/08/2016    Past Medical History:  Diagnosis Date   Allergy    Arthritis     Family History  Problem Relation Age of Onset   Diabetes Mother    Cancer Mother    COPD Father    Diabetes Father    Past Surgical History:  Procedure Laterality Date   APPENDECTOMY  11/17/2018   CARPAL TUNNEL RELEASE Left 2002   MANDIBLE SURGERY  1994   Social History   Social History Narrative   Not on file   Immunization History  Administered Date(s) Administered   Influenza,inj,Quad PF,6+ Mos 06/03/2017, 06/02/2018   Moderna Sars-Covid-2 Vaccination 10/16/2019, 11/18/2019, 05/13/2020, 03/03/2021     Objective: Vital Signs: BP (!) 142/93 (BP Location: Left Arm, Patient Position: Sitting, Cuff Size: Normal)   Pulse 80   Ht 5\' 11"  (1.803 m)  Wt 187 lb 9.6 oz (85.1 kg)   BMI 26.16 kg/m    Physical Exam Vitals and nursing note reviewed.  Constitutional:      Appearance: He is well-developed.  HENT:     Head: Normocephalic and atraumatic.  Eyes:     Conjunctiva/sclera: Conjunctivae normal.     Pupils: Pupils are equal, round, and reactive to light.  Cardiovascular:     Rate and Rhythm: Normal rate and regular rhythm.     Heart sounds: Normal heart sounds.  Pulmonary:      Effort: Pulmonary effort is normal.     Breath sounds: Normal breath sounds.  Abdominal:     General: Bowel sounds are normal.     Palpations: Abdomen is soft.  Musculoskeletal:     Cervical back: Normal range of motion and neck supple.  Skin:    General: Skin is warm and dry.     Capillary Refill: Capillary refill takes less than 2 seconds.  Neurological:     Mental Status: He is alert and oriented to person, place, and time.  Psychiatric:        Behavior: Behavior normal.     Musculoskeletal Exam: C-spine was in good range of motion.  Shoulder joints, elbow joints, wrist joints with good range of motion.  He had difficulty making a fist with his right hand.  He had tenderness over the right palmaris longus tendon.  None of the joints were swollen.  No synovitis was noted.  Hip joints and knee joints with good range of motion.  He had no tenderness over ankles or MTPs.  CDAI Exam: CDAI Score: 0.6  Patient Global: 4 mm; Provider Global: 2 mm Swollen: 0 ; Tender: 0  Joint Exam 05/31/2021   No joint exam has been documented for this visit   There is currently no information documented on the homunculus. Go to the Rheumatology activity and complete the homunculus joint exam.  Investigation: No additional findings.  Imaging: No results found.  Recent Labs: Lab Results  Component Value Date   WBC 5.2 04/27/2021   HGB 14.2 04/27/2021   PLT 163 04/27/2021   NA 139 04/27/2021   K 4.2 04/27/2021   CL 101 04/27/2021   CO2 30 04/27/2021   GLUCOSE 92 04/27/2021   BUN 17 04/27/2021   CREATININE 1.41 (H) 04/27/2021   BILITOT 0.5 04/27/2021   AST 21 04/27/2021   ALT 18 04/27/2021   PROT 7.8 04/27/2021   CALCIUM 9.8 04/27/2021   GFRAA 68 01/17/2021   QFTBGOLDPLUS NEGATIVE 10/04/2020    Speciality Comments: Prior therapy includes: methotrexate (elevated creatinine), Arava ( rash in the past), Imuran (multiple SE's), and inadequate response to ENBREL, HUMIRA, ORENCIA, XELJANZ  and SIMPONI ARIA.  Procedures:  No procedures performed Allergies: Sulfa antibiotics   Assessment / Plan:     Visit Diagnoses: Rheumatoid arthritis involving multiple sites with positive rheumatoid factor (HCC)-his rheumatoid arthritis has been well controlled with Simponi subcu every 28 days.  He states he was a spacing Simponi to once a month when he started having increased joint pain.  But she is doing better now.  High risk medication use - Simponi 50 mg subcu every 28 days and Arava 10 mg 1 tablet daily.  His most recent labs from September 2022 showed creatinine 1.41.  He has had intermittent elevation of creatinine.  We will continue to monitor labs.  TB gold was negative on October 04, 2020.  He has been seeing Dr. Donzetta Starch  on yearly basis to screen for nonmelanoma skin cancer.  He is advised to get labs every 3 months to monitor for drug toxicity.  He was also advised to stop Simponi and Arava in case he develops an infection and restart once infection resolves.  Information regarding immunization was also placed in the AVS.  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  He states he developed pain and discomfort in his right hand which got worse at nighttime.  He had tendinitis in the right palmaris longus tendon.  I will give him a prednisone taper.  Starting at 20 mg and taper by 5 mg every 2 days.  I advised him to monitor his blood pressure closely while he is taking prednisone.  Plan: predniSONE (DELTASONE) 5 MG tablet.  Side effects of prednisone use were also discussed.  Vitiligo-he is followed by Dr. Yetta Barre.  He has some new lesions.  Hair loss-followed by Dr. Yetta Barre.  History of gastroesophageal reflux (GERD)-he is on Prilosec.  Elevated blood pressure reading-his blood pressure was elevated today.  Advised him to get a blood pressure monitor and check the readings 3 times a week.  If the blood pressure readings continue to  be elevated he may need an antihypertensive agent.  He will keep a diary.  Seasonal allergies-he takes Zyrtec as needed.  Orders: No orders of the defined types were placed in this encounter.  Meds ordered this encounter  Medications   predniSONE (DELTASONE) 5 MG tablet    Sig: Take 4 tabs po qd x 2 days, 3  tabs po qd x 2 days, 2  tabs po qd x 2 days, 1  tab po qd x 2 days    Dispense:  20 tablet    Refill:  0      Follow-Up Instructions: Return for Rheumatoid arthritis.   Pollyann Savoy, MD  Note - This record has been created using Animal nutritionist.  Chart creation errors have been sought, but may not always  have been located. Such creation errors do not reflect on  the standard of medical care.

## 2021-05-31 NOTE — Patient Instructions (Addendum)
Standing Labs We placed an order today for your standing lab work.   Please have your standing labs drawn in December and every 3 months  If possible, please have your labs drawn 2 weeks prior to your appointment so that the provider can discuss your results at your appointment.  Please note that you may see your imaging and lab results in MyChart before we have reviewed them. We may be awaiting multiple results to interpret others before contacting you. Please allow our office up to 72 hours to thoroughly review all of the results before contacting the office for clarification of your results.  We have open lab daily: Monday through Thursday from 1:30-4:30 PM and Friday from 1:30-4:00 PM at the office of Dr. Heydi Swango, [facility].   Please be advised, all patients with office appointments requiring lab work will take precedent over walk-in lab work.  If possible, please come for your lab work on Monday and Friday afternoons, as you may experience shorter wait times. The office is located at [address], [address], [place], [place] [address] No appointment is necessary.   Labs are drawn by [facility]. Please bring your co-pay at the time of your lab draw.  You may receive a bill from [facility] for your lab work.  If you wish to have your labs drawn at another location, please call the office 24 hours in advance to send orders.  If you have any questions regarding directions or hours of operation,  please call [phone].   As a reminder, please drink plenty of water prior to coming for your lab work. Thanks!   Vaccines You are taking a medication(s) that can suppress your immune system.  The following immunizations are recommended: Flu annually Covid-19  Td/Tdap (tetanus, diphtheria, pertussis) every 10 years Pneumonia (Prevnar 15 then Pneumovax 23 at least 1 year apart.  Alternatively, can take Prevnar 20 without needing additional dose) Shingrix: 2 doses from 4  weeks to 6 months apart  Please check with your PCP to make sure you are up to date.   If you have signs or symptoms of an infection or start antibiotics: First, call your PCP for workup of your infection. Hold your medication through the infection, until you complete your antibiotics, and until symptoms resolve if you take the following: Injectable medication (Actemra, Benlysta, Cimzia, Cosentyx, Enbrel, Humira, Kevzara, Orencia, Remicade, Simponi, Stelara, Taltz, Tremfya) Methotrexate Leflunomide (Arava) Mycophenolate (Cellcept) Xeljanz, Olumiant, or Rinvoq  

## 2021-05-31 NOTE — Telephone Encounter (Signed)
Next Visit: 09/19/2021    Last Visit: 04/13/2021   Last Fill: 03/07/2021  Current Dose per office note on 04/13/2021: Simponi 50 mg subcu every 28 days   Labs: 04/27/2021 Creatinine is elevated-1.41 and eGFR is borderline low-57.  Rest of CMP WNL.  CBC stable.   TB Gold: 10/04/2020  Okay to refill Simponi?

## 2021-06-01 ENCOUNTER — Other Ambulatory Visit (HOSPITAL_COMMUNITY): Payer: Self-pay

## 2021-06-01 ENCOUNTER — Other Ambulatory Visit: Payer: Self-pay | Admitting: Pharmacist

## 2021-06-01 ENCOUNTER — Telehealth: Payer: Self-pay | Admitting: Pharmacist

## 2021-06-01 MED ORDER — SIMPONI 50 MG/0.5ML ~~LOC~~ SOSY
PREFILLED_SYRINGE | SUBCUTANEOUS | 2 refills | Status: DC
  Filled 2021-06-01: qty 0.5, 28d supply, fill #0
  Filled 2021-06-22: qty 0.5, 28d supply, fill #1
  Filled 2021-07-24: qty 0.5, 28d supply, fill #2

## 2021-06-01 NOTE — Telephone Encounter (Signed)
Called patient to schedule an appointment for the [facility]. I was unable to reach the patient so I left a HIPAA-compliant message requesting that the patient return my call.   Luke Van Ausdall, PharmD, BCACP, CPP Clinical Pharmacist [facility] [phone]  

## 2021-06-02 ENCOUNTER — Other Ambulatory Visit: Payer: Self-pay

## 2021-06-02 ENCOUNTER — Ambulatory Visit: Payer: 59 | Attending: Physician Assistant | Admitting: Pharmacist

## 2021-06-02 DIAGNOSIS — Z79899 Other long term (current) drug therapy: Secondary | ICD-10-CM

## 2021-06-02 NOTE — Progress Notes (Signed)
  S: Patient presents for review of their specialty medication therapy.  Patient is currently taking Simponi for RA. Patient is managed by Dr. Corliss Skains for this.   Adherence: confirms. He has been taking Simponi for several years.   Efficacy: reports that this works well for him.   Dosing:  Rheumatoid arthritis: SubQ: 50 mg once a month   Dose adjustments: Renal: no dose adjustments (has not been studied) Hepatic: no dose adjustments (has not been studied)  Drug-drug interactions: none identified   Screening: TB test: completed per pt Hepatitis: completed per pt   Monitoring: S/sx of infection: none  CBC: results from 04/27/21 S/sx of hypersensitivity: none  S/sx of malignancy: none  S/sx of heart failure: none  S/sx of autoimmune disorder: none   O: Lab Results  Component Value Date   WBC 5.2 04/27/2021   HGB 14.2 04/27/2021   HCT 45.1 04/27/2021   MCV 80.1 04/27/2021   PLT 163 04/27/2021      Chemistry      Component Value Date/Time   NA 139 04/27/2021 1547   K 4.2 04/27/2021 1547   CL 101 04/27/2021 1547   CO2 30 04/27/2021 1547   BUN 17 04/27/2021 1547   CREATININE 1.41 (H) 04/27/2021 1547      Component Value Date/Time   CALCIUM 9.8 04/27/2021 1547   AST 21 04/27/2021 1547   ALT 18 04/27/2021 1547   BILITOT 0.5 04/27/2021 1547      A/P: 1. Medication review: Patient currently on Simponi for RA and is tolerating it well. Reviewed the medication with the patient, including the following: Simponi, golimumab, is a TNF? blocker.  Patient educated on purpose, proper use and potential adverse effects of Simponi. There is an increased risk of infection and malignancy with this medication. Do not give patients live vaccinations while they are on this medication. Subcutaneous injection: Hold autoinjector firmly against skin and inject subcutaneously into thigh, lower abdomen (below navel), or upper arm. A loud click is heard when injection has begun. Continue to  hold autoinjector against skin until second click is heard (may take 3 to 15 seconds). Following second click, lift autoinjector from injection site. Rotate injection sites and avoid injecting into tender, red, scaly, hard, or bruised skin, or areas with scars or stretch marks. If multiple injections are required for a single dose, administer at different sites on body. No recommendations for changes.  Timothy Waters, PharmD, P[place]y [place], CPP Clinical Pharmacist [facility] [phone]

## 2021-06-05 ENCOUNTER — Other Ambulatory Visit (HOSPITAL_COMMUNITY): Payer: Self-pay

## 2021-06-05 DIAGNOSIS — M546 Pain in thoracic spine: Secondary | ICD-10-CM | POA: Diagnosis not present

## 2021-06-05 DIAGNOSIS — M62830 Muscle spasm of back: Secondary | ICD-10-CM | POA: Diagnosis not present

## 2021-06-05 DIAGNOSIS — M5136 Other intervertebral disc degeneration, lumbar region: Secondary | ICD-10-CM | POA: Diagnosis not present

## 2021-06-05 DIAGNOSIS — M5442 Lumbago with sciatica, left side: Secondary | ICD-10-CM | POA: Diagnosis not present

## 2021-06-15 ENCOUNTER — Other Ambulatory Visit (HOSPITAL_COMMUNITY): Payer: Self-pay

## 2021-06-22 ENCOUNTER — Other Ambulatory Visit (HOSPITAL_COMMUNITY): Payer: Self-pay

## 2021-06-26 DIAGNOSIS — Z1331 Encounter for screening for depression: Secondary | ICD-10-CM | POA: Diagnosis not present

## 2021-06-26 DIAGNOSIS — J45991 Cough variant asthma: Secondary | ICD-10-CM | POA: Diagnosis not present

## 2021-06-26 DIAGNOSIS — Z6825 Body mass index (BMI) 25.0-25.9, adult: Secondary | ICD-10-CM | POA: Diagnosis not present

## 2021-06-26 DIAGNOSIS — Z0000 Encounter for general adult medical examination without abnormal findings: Secondary | ICD-10-CM | POA: Diagnosis not present

## 2021-06-26 DIAGNOSIS — K219 Gastro-esophageal reflux disease without esophagitis: Secondary | ICD-10-CM | POA: Diagnosis not present

## 2021-06-26 DIAGNOSIS — L290 Pruritus ani: Secondary | ICD-10-CM | POA: Diagnosis not present

## 2021-06-26 DIAGNOSIS — Z79899 Other long term (current) drug therapy: Secondary | ICD-10-CM | POA: Diagnosis not present

## 2021-06-28 ENCOUNTER — Other Ambulatory Visit (HOSPITAL_COMMUNITY): Payer: Self-pay

## 2021-06-29 ENCOUNTER — Telehealth: Payer: Self-pay | Admitting: *Deleted

## 2021-06-29 DIAGNOSIS — Z79899 Other long term (current) drug therapy: Secondary | ICD-10-CM

## 2021-06-29 NOTE — Telephone Encounter (Signed)
Labs received from:Dr. Sharman Crate Hamrick  Drawn on:06/26/2021  Reviewed by: Sherron Ales, PA-C  Labs drawn:CBC, CMP, Hgb A1C  Results:RBC 5.88  MCH 25.7  Platelets 103  Neutrophils (Absolute) 1.1  Hgb A1C 6.0  Patient is on Simponi 50 mg monthly and Arava 10 mg po daily.  Per Ladona Ridgel, recheck CBC with diff in 1 month prior to next Simponi injection.   Patient advised and will come into the office to have it repeated.

## 2021-07-04 ENCOUNTER — Other Ambulatory Visit (HOSPITAL_COMMUNITY): Payer: Self-pay

## 2021-07-05 ENCOUNTER — Other Ambulatory Visit: Payer: Self-pay | Admitting: *Deleted

## 2021-07-05 DIAGNOSIS — Z79899 Other long term (current) drug therapy: Secondary | ICD-10-CM

## 2021-07-05 LAB — CBC WITH DIFFERENTIAL/PLATELET
Absolute Monocytes: 806 cells/uL (ref 200–950)
Basophils Absolute: 62 cells/uL (ref 0–200)
Basophils Relative: 1.2 %
Eosinophils Absolute: 432 cells/uL (ref 15–500)
Eosinophils Relative: 8.3 %
HCT: 44.4 % (ref 38.5–50.0)
Hemoglobin: 13.9 g/dL (ref 13.2–17.1)
Lymphs Abs: 1742 cells/uL (ref 850–3900)
MCH: 25 pg — ABNORMAL LOW (ref 27.0–33.0)
MCHC: 31.3 g/dL — ABNORMAL LOW (ref 32.0–36.0)
MCV: 80 fL (ref 80.0–100.0)
MPV: 13.4 fL — ABNORMAL HIGH (ref 7.5–12.5)
Monocytes Relative: 15.5 %
Neutro Abs: 2158 cells/uL (ref 1500–7800)
Neutrophils Relative %: 41.5 %
Platelets: 160 10*3/uL (ref 140–400)
RBC: 5.55 10*6/uL (ref 4.20–5.80)
RDW: 13.2 % (ref 11.0–15.0)
Total Lymphocyte: 33.5 %
WBC: 5.2 10*3/uL (ref 3.8–10.8)

## 2021-07-06 NOTE — Progress Notes (Signed)
CBC stable

## 2021-07-11 ENCOUNTER — Other Ambulatory Visit (HOSPITAL_COMMUNITY): Payer: Self-pay

## 2021-07-11 ENCOUNTER — Other Ambulatory Visit: Payer: Self-pay | Admitting: Rheumatology

## 2021-07-12 ENCOUNTER — Other Ambulatory Visit (HOSPITAL_COMMUNITY): Payer: Self-pay

## 2021-07-12 MED ORDER — LEFLUNOMIDE 10 MG PO TABS
ORAL_TABLET | Freq: Every day | ORAL | 0 refills | Status: DC
  Filled 2021-07-12: qty 90, 90d supply, fill #0

## 2021-07-12 NOTE — Telephone Encounter (Signed)
Next Visit: 09/19/2021  Last Visit: 05/31/2021  Last Fill: 04/05/2021  DX: Rheumatoid arthritis involving multiple sites with positive rheumatoid factor   Current Dose per office note 05/31/2021: Arava 10 mg 1 tablet daily  Labs: 06/26/2021 RBC 5.88 MCH 25.7 Platelets 103 Neutrophils (Absolute) 1.1 Hgb A1C 6.0 07/05/2021 CBC stable.   Okay to refill Arava?

## 2021-07-14 ENCOUNTER — Other Ambulatory Visit (HOSPITAL_COMMUNITY): Payer: Self-pay

## 2021-07-17 ENCOUNTER — Other Ambulatory Visit (HOSPITAL_COMMUNITY): Payer: Self-pay

## 2021-07-17 MED ORDER — FAMOTIDINE 40 MG PO TABS
40.0000 mg | ORAL_TABLET | Freq: Every morning | ORAL | 0 refills | Status: DC
  Filled 2021-07-17: qty 90, 90d supply, fill #0

## 2021-07-21 ENCOUNTER — Other Ambulatory Visit: Payer: Self-pay | Admitting: Rheumatology

## 2021-07-21 ENCOUNTER — Other Ambulatory Visit (HOSPITAL_COMMUNITY): Payer: Self-pay

## 2021-07-21 ENCOUNTER — Other Ambulatory Visit: Payer: Self-pay | Admitting: *Deleted

## 2021-07-21 MED ORDER — PREDNISONE 5 MG PO TABS
ORAL_TABLET | ORAL | 0 refills | Status: DC
Start: 2021-07-21 — End: 2021-09-19
  Filled 2021-07-21: qty 40, 16d supply, fill #0

## 2021-07-21 NOTE — Telephone Encounter (Signed)
Patient states he is having pain in left hand. Patient states the pain is between his index finger and middle finger. Patient states this is the third time this has happened. Patient states this is happening about 1 week prior to his Simponi injection being due. Patient would like to know what you advise.

## 2021-07-21 NOTE — Addendum Note (Signed)
Addended by: Henriette Combs on: 07/21/2021 01:59 PM   Modules accepted: Orders

## 2021-07-21 NOTE — Telephone Encounter (Signed)
Patient calling to request a rx for Diclofenac Gel 1% sent into [facility]. Please add medication to patient drug list, per patient. He and Dr. Corliss Skains spoke about him using this, but it was not listed with his medications.

## 2021-07-21 NOTE — Telephone Encounter (Signed)
I returned patient's call.  He is uncertain if it is the swelling of the joint or between his fingers.  I discussed the option of increasing the dose of leflunomide to 20 mg p.o. daily.  I also discussed possibility of switching to Taltz.  He will think about it and will let me know.  I advised him to schedule an appointment for the evaluation before changing medications.  He requested a prednisone taper due to increased workload and increased pain in his hands.  I will send a prescription for prednisone 5 mg tablets, 4 tablets p.o. daily for 4 days and taper by 5 mg every 4 days.

## 2021-07-24 ENCOUNTER — Other Ambulatory Visit (HOSPITAL_COMMUNITY): Payer: Self-pay

## 2021-07-24 DIAGNOSIS — M5442 Lumbago with sciatica, left side: Secondary | ICD-10-CM | POA: Diagnosis not present

## 2021-07-24 DIAGNOSIS — M5136 Other intervertebral disc degeneration, lumbar region: Secondary | ICD-10-CM | POA: Diagnosis not present

## 2021-07-24 DIAGNOSIS — M546 Pain in thoracic spine: Secondary | ICD-10-CM | POA: Diagnosis not present

## 2021-07-24 DIAGNOSIS — M62830 Muscle spasm of back: Secondary | ICD-10-CM | POA: Diagnosis not present

## 2021-07-24 MED ORDER — DICLOFENAC SODIUM 1 % EX GEL
CUTANEOUS | 2 refills | Status: DC
  Filled 2021-07-24: qty 100, 6d supply, fill #0

## 2021-07-24 NOTE — Telephone Encounter (Signed)
Next Visit: 09/19/2021  Last Visit: 05/31/2021  Dx: Tendinitis of finger of right hand   Current Dose per office note on 05/31/2021: not discussed  Okay to refill Diclofenac Gel?

## 2021-07-25 ENCOUNTER — Other Ambulatory Visit: Payer: Self-pay | Admitting: Physician Assistant

## 2021-07-26 ENCOUNTER — Other Ambulatory Visit (HOSPITAL_COMMUNITY): Payer: Self-pay

## 2021-07-26 MED ORDER — OMEPRAZOLE 40 MG PO CPDR
40.0000 mg | DELAYED_RELEASE_CAPSULE | Freq: Every day | ORAL | 0 refills | Status: DC
  Filled 2021-07-26: qty 90, 90d supply, fill #0

## 2021-07-26 NOTE — Telephone Encounter (Signed)
Next Visit: 09/19/2021   Last Visit: 05/31/2021  Current Dose per office note on 05/31/2021: not discussed  Last Fill: 05/01/2021  Okay to refill Omeprazole?

## 2021-08-01 ENCOUNTER — Other Ambulatory Visit (HOSPITAL_COMMUNITY): Payer: Self-pay

## 2021-08-18 ENCOUNTER — Other Ambulatory Visit (HOSPITAL_COMMUNITY): Payer: Self-pay

## 2021-08-25 ENCOUNTER — Other Ambulatory Visit: Payer: Self-pay | Admitting: Pharmacist

## 2021-08-25 ENCOUNTER — Other Ambulatory Visit (HOSPITAL_COMMUNITY): Payer: Self-pay

## 2021-08-25 ENCOUNTER — Other Ambulatory Visit: Payer: Self-pay | Admitting: Physician Assistant

## 2021-08-25 MED ORDER — SIMPONI 50 MG/0.5ML ~~LOC~~ SOSY
PREFILLED_SYRINGE | SUBCUTANEOUS | 2 refills | Status: DC
  Filled 2021-08-25: qty 0.5, fill #0

## 2021-08-25 MED ORDER — SIMPONI 50 MG/0.5ML ~~LOC~~ SOSY
PREFILLED_SYRINGE | SUBCUTANEOUS | 2 refills | Status: DC
  Filled 2021-08-25: qty 0.5, 30d supply, fill #0

## 2021-08-25 NOTE — Telephone Encounter (Signed)
Next Visit: 09/19/2021   Last Visit: 05/31/2021   Last Fill: 06/01/2021  DX: Rheumatoid arthritis involving multiple sites with positive rheumatoid factor    Current Dose per office note 05/31/2021: Simponi 50 mg subcu every 28 days   Labs: 06/26/2021 RBC 5.88 MCH 25.7 Platelets 103 Neutrophils (Absolute) 1.1 Hgb A1C 6.0 07/05/2021 CBC stable.   TB Gold: 10/04/2020 Neg   Okay to refill Simponi?

## 2021-08-28 ENCOUNTER — Telehealth: Payer: Self-pay | Admitting: Pharmacist

## 2021-08-28 ENCOUNTER — Other Ambulatory Visit (HOSPITAL_COMMUNITY): Payer: Self-pay

## 2021-08-28 DIAGNOSIS — K5730 Diverticulosis of large intestine without perforation or abscess without bleeding: Secondary | ICD-10-CM | POA: Diagnosis not present

## 2021-08-28 DIAGNOSIS — Z800 Family history of malignant neoplasm of digestive organs: Secondary | ICD-10-CM | POA: Diagnosis not present

## 2021-08-28 DIAGNOSIS — Z79899 Other long term (current) drug therapy: Secondary | ICD-10-CM

## 2021-08-28 DIAGNOSIS — Z1211 Encounter for screening for malignant neoplasm of colon: Secondary | ICD-10-CM | POA: Diagnosis not present

## 2021-08-28 DIAGNOSIS — M0579 Rheumatoid arthritis with rheumatoid factor of multiple sites without organ or systems involvement: Secondary | ICD-10-CM

## 2021-08-28 DIAGNOSIS — Z8371 Family history of colonic polyps: Secondary | ICD-10-CM | POA: Diagnosis not present

## 2021-08-28 NOTE — Telephone Encounter (Signed)
Patient returned your call and asked for you to each out to him on his cell.

## 2021-08-28 NOTE — Telephone Encounter (Signed)
Received notiification from [facility] that Simponi PFS is on back order until end of January 2023 earliest. ATC patient to review if he was comfortable with [place] sending autoinjector rx for this month's fill.  Requested return call  Chesley Mires, PharmD, MPH, BCPS Clinical Pharmacist (Rheumatology and Pulmonology)

## 2021-08-29 ENCOUNTER — Other Ambulatory Visit (HOSPITAL_COMMUNITY): Payer: Self-pay

## 2021-08-29 ENCOUNTER — Telehealth: Payer: Self-pay

## 2021-08-29 ENCOUNTER — Other Ambulatory Visit: Payer: Self-pay | Admitting: Pharmacist

## 2021-08-29 DIAGNOSIS — Z79899 Other long term (current) drug therapy: Secondary | ICD-10-CM

## 2021-08-29 DIAGNOSIS — M0579 Rheumatoid arthritis with rheumatoid factor of multiple sites without organ or systems involvement: Secondary | ICD-10-CM

## 2021-08-29 MED ORDER — SIMPONI 50 MG/0.5ML ~~LOC~~ SOAJ
50.0000 mg | SUBCUTANEOUS | 0 refills | Status: DC
  Filled 2021-08-29: qty 0.5, 30d supply, fill #0
  Filled 2021-08-30: qty 0.5, 28d supply, fill #0

## 2021-08-29 MED ORDER — SIMPONI 50 MG/0.5ML ~~LOC~~ SOAJ
50.0000 mg | SUBCUTANEOUS | 0 refills | Status: DC
  Filled 2021-08-29: qty 0.5, fill #0
  Filled 2021-08-29: qty 0.5, 30d supply, fill #0

## 2021-08-29 NOTE — Telephone Encounter (Signed)
Returned call to patient.  He states he can take autoinjector this month. Is due for dose tomorrow, 08/30/21.  Note sent with prescription stating patient prefers PFS and this is one time fill for autoinjector.  Knox Saliva, PharmD, MPH, BCPS Clinical Pharmacist (Rheumatology and Pulmonology)

## 2021-08-29 NOTE — Telephone Encounter (Signed)
[facility] reached out and stated that the Auto-Injectors are requiring a PA.   Submitted an URGENT Prior Authorization request to [facility] for [facility] via CoverMyMeds. Will update once we receive a response.   Key: [id number]

## 2021-08-30 ENCOUNTER — Other Ambulatory Visit (HOSPITAL_COMMUNITY): Payer: Self-pay

## 2021-08-30 NOTE — Telephone Encounter (Addendum)
Received notification from [facility] regarding a prior authorization for [facility] autoinjector. Authorization has been APPROVED from 08/30/21 to 08/28/22 (qty of 1 pen per 28 days)  Patient must fill through [facility]: [phone]   Authorization # [id number]  Notified [facility] via Teams messaging  Chesley Mires, PharmD, MPH, BCPS Clinical Pharmacist (Rheumatology and Pulmonology)

## 2021-08-31 DIAGNOSIS — M62830 Muscle spasm of back: Secondary | ICD-10-CM | POA: Diagnosis not present

## 2021-08-31 DIAGNOSIS — M546 Pain in thoracic spine: Secondary | ICD-10-CM | POA: Diagnosis not present

## 2021-08-31 DIAGNOSIS — M5136 Other intervertebral disc degeneration, lumbar region: Secondary | ICD-10-CM | POA: Diagnosis not present

## 2021-08-31 DIAGNOSIS — M5442 Lumbago with sciatica, left side: Secondary | ICD-10-CM | POA: Diagnosis not present

## 2021-09-06 NOTE — Progress Notes (Signed)
Office Visit Note  Patient: Timothy Waters             Date of Birth: [birth year]           MRN: [id number]             PCP: Cyndi Bender, PA-C Referring: Cyndi Bender, PA-C Visit Date: 09/19/2021 Occupation: @GUAROCC @  Subjective:  Medication management  History of Present Illness: Khylil Degner is a 61 y.o. male with history of seropositive rheumatoid arthritis and osteoarthritis.  He states he has been doing well on Simponi subcu injections and leflunomide combination.  He has not had a flare since the last visit.  He believes the flares are related to delay in the Simponi injections.  He denies any joint pain or swelling today.  Activities of Daily Living:  Patient reports morning stiffness for 0 minutes.   Patient Denies nocturnal pain.  Difficulty dressing/grooming: Denies Difficulty climbing stairs: Denies Difficulty getting out of chair: Denies Difficulty using hands for taps, buttons, cutlery, and/or writing: Denies  Review of Systems  Constitutional:  Negative for fatigue.  HENT:  Negative for mouth sores, mouth dryness and nose dryness.   Eyes:  Negative for pain, itching and dryness.  Respiratory:  Negative for shortness of breath and difficulty breathing.   Cardiovascular:  Negative for chest pain and palpitations.  Gastrointestinal:  Negative for blood in stool, constipation and diarrhea.  Endocrine: Negative for increased urination.  Genitourinary:  Negative for difficulty urinating.  Musculoskeletal:  Negative for joint pain, joint pain, joint swelling, myalgias, morning stiffness, muscle tenderness and myalgias.  Skin:  Negative for color change, rash and redness.  Allergic/Immunologic: Negative for susceptible to infections.  Neurological:  Negative for dizziness, numbness, headaches, memory loss and weakness.  Hematological:  Negative for bruising/bleeding tendency.  Psychiatric/Behavioral:  Negative for confusion.    PMFS History:  Patient Active Problem  List   Diagnosis Date Noted   Seasonal allergies 01/12/2020   History of gastroesophageal reflux (GERD) 01/01/2017   Rheumatoid arthritis involving multiple sites with positive rheumatoid factor ([place]) 11/08/2016   High risk medication use 11/08/2016   Vitiligo 11/08/2016    Past Medical History:  Diagnosis Date   Allergy    Arthritis     Family History  Problem Relation Age of Onset   Diabetes Mother    Cancer Mother    COPD Father    Diabetes Father    Past Surgical History:  Procedure Laterality Date   APPENDECTOMY  11/17/2018   CARPAL TUNNEL RELEASE Left 2002   [place]   Social History   Social History Narrative   Not on file   Immunization History  Administered Date(s) Administered   Influenza,inj,Quad PF,6+ Mos 06/03/2017, 06/02/2018   Moderna Sars-Covid-2 Vaccination 10/16/2019, 11/18/2019, 05/13/2020, 03/03/2021     Objective: Vital Signs: BP (!) 134/94 (BP Location: Left Arm, Patient Position: Sitting, Cuff Size: Normal)    Pulse 78    Ht 5\' 11"  (1.803 m)    Wt 188 lb (85.3 kg)    BMI 26.22 kg/m    Physical Exam Vitals and nursing note reviewed.  Constitutional:      Appearance: He is well-developed.  HENT:     Head: Normocephalic and atraumatic.  Eyes:     Conjunctiva/sclera: Conjunctivae normal.     Pupils: Pupils are equal, round, and reactive to light.  Cardiovascular:     Rate and Rhythm: Normal rate and regular rhythm.     Heart sounds:  Normal heart sounds.  Pulmonary:     Effort: Pulmonary effort is normal.     Breath sounds: Normal breath sounds.  Abdominal:     General: Bowel sounds are normal.     Palpations: Abdomen is soft.  Musculoskeletal:     Cervical back: Normal range of motion and neck supple.  Skin:    General: Skin is warm and dry.     Capillary Refill: Capillary refill takes less than 2 seconds.  Neurological:     Mental Status: He is alert and oriented to person, place, and time.  Psychiatric:         Behavior: Behavior normal.     Musculoskeletal Exam: C-spine thoracic and lumbar spine were in good range of motion.  Shoulder joints, elbow joints Barras joints, MCPs PIPs and DIPs with good range of motion with no synovitis.  Hip joints, knee joints, ankles, MTPs and PIPs with good range of motion with no synovitis.  CDAI Exam: CDAI Score: 0.2  Patient Global: 2 mm; Provider Global: 0 mm Swollen: 0 ; Tender: 0  Joint Exam 09/19/2021   No joint exam has been documented for this visit   There is currently no information documented on the homunculus. Go to the Rheumatology activity and complete the homunculus joint exam.  Investigation: No additional findings.  Imaging: No results found.  Recent Labs: Lab Results  Component Value Date   WBC 5.2 07/05/2021   HGB 13.9 07/05/2021   PLT 160 07/05/2021   NA 139 04/27/2021   K 4.2 04/27/2021   CL 101 04/27/2021   CO2 30 04/27/2021   GLUCOSE 92 04/27/2021   BUN 17 04/27/2021   CREATININE 1.41 (H) 04/27/2021   BILITOT 0.5 04/27/2021   AST 21 04/27/2021   ALT 18 04/27/2021   PROT 7.8 04/27/2021   CALCIUM 9.8 04/27/2021   GFRAA 68 01/17/2021   QFTBGOLDPLUS NEGATIVE 10/04/2020    Speciality Comments: Prior therapy includes: methotrexate (elevated creatinine), Arava ( rash in the past), Imuran (multiple SE's), and inadequate response to ENBREL, HUMIRA, ORENCIA, XELJANZ and SIMPONI ARIA.  Procedures:  No procedures performed Allergies: Sulfa antibiotics   Assessment / Plan:     Visit Diagnoses: Rheumatoid arthritis involving multiple sites with positive rheumatoid factor (HCC)-he had no synovitis on my examination today.  He has been taking leflunomide and Simponi on a regular basis.  He denies any joint pain or joint swelling.  High risk medication use - Simponi 50 mg subcu every 28 days and Arava 10 mg 1 tablet daily.  -Labs from November 2022 were reviewed.  Which were within normal limits.  TB gold was negative on October 04, 2020.  We will recheck labs next month.  Plan: CBC with Differential/Platelet, COMPLETE METABOLIC PANEL WITH GFR, QuantiFERON-TB Gold Plus, information regarding immunization was placed in the AVS.  He has been advised to stop Simponi and leflunomide in case he develops an infection and resume after the infection resolves.  He was also advised to have annual skin examination while he is on Simponi to screen for nonmelanoma skin cancer.  Use of sunscreen and hat was advised.  Patient will schedule an appointment with Dr. Yetta Barre.  Vitiligo -he saw Dr. Donzetta Starch in the past.   Hair loss -he has noticed improvement in hair loss after using the topical agents.  History of gastroesophageal reflux (GERD)  Elevated blood pressure reading-blood pressure was elevated today.  He has been advised to monitor blood pressure closely.  He  states his blood pressure is normal at home.  Seasonal allergies-he denies any recent flares of asthma.  Orders: Orders Placed This Encounter  Procedures   CBC with Differential/Platelet   COMPLETE METABOLIC PANEL WITH GFR   QuantiFERON-TB Gold Plus   No orders of the defined types were placed in this encounter.    Follow-Up Instructions: Return in about 5 months (around 02/16/2022) for Rheumatoid arthritis.   Bo Merino, MD  Note - This record has been created using Editor, commissioning.  Chart creation errors have been sought, but may not always  have been located. Such creation errors do not reflect on  the standard of medical care.

## 2021-09-16 ENCOUNTER — Other Ambulatory Visit (HOSPITAL_COMMUNITY): Payer: Self-pay

## 2021-09-18 ENCOUNTER — Other Ambulatory Visit: Payer: Self-pay | Admitting: Physician Assistant

## 2021-09-18 ENCOUNTER — Other Ambulatory Visit (HOSPITAL_COMMUNITY): Payer: Self-pay

## 2021-09-18 DIAGNOSIS — Z79899 Other long term (current) drug therapy: Secondary | ICD-10-CM

## 2021-09-18 DIAGNOSIS — M0579 Rheumatoid arthritis with rheumatoid factor of multiple sites without organ or systems involvement: Secondary | ICD-10-CM

## 2021-09-18 MED ORDER — FLUTICASONE PROPIONATE HFA 220 MCG/ACT IN AERO
INHALATION_SPRAY | RESPIRATORY_TRACT | 5 refills | Status: DC
  Filled 2021-09-18: qty 12, 30d supply, fill #0
  Filled 2021-10-27: qty 12, 30d supply, fill #1
  Filled 2021-11-24: qty 12, 30d supply, fill #2
  Filled 2021-12-30: qty 12, 30d supply, fill #3
  Filled 2022-01-26: qty 12, 30d supply, fill #4
  Filled 2022-03-06: qty 12, 30d supply, fill #5

## 2021-09-19 ENCOUNTER — Encounter: Payer: Self-pay | Admitting: Rheumatology

## 2021-09-19 ENCOUNTER — Ambulatory Visit: Payer: 59 | Admitting: Rheumatology

## 2021-09-19 ENCOUNTER — Other Ambulatory Visit (HOSPITAL_COMMUNITY): Payer: Self-pay

## 2021-09-19 ENCOUNTER — Other Ambulatory Visit: Payer: Self-pay | Admitting: Pharmacist

## 2021-09-19 ENCOUNTER — Other Ambulatory Visit: Payer: Self-pay

## 2021-09-19 VITALS — BP 134/94 | HR 78 | Ht 71.0 in | Wt 188.0 lb

## 2021-09-19 DIAGNOSIS — R030 Elevated blood-pressure reading, without diagnosis of hypertension: Secondary | ICD-10-CM | POA: Diagnosis not present

## 2021-09-19 DIAGNOSIS — L659 Nonscarring hair loss, unspecified: Secondary | ICD-10-CM | POA: Diagnosis not present

## 2021-09-19 DIAGNOSIS — Z8719 Personal history of other diseases of the digestive system: Secondary | ICD-10-CM | POA: Diagnosis not present

## 2021-09-19 DIAGNOSIS — M0579 Rheumatoid arthritis with rheumatoid factor of multiple sites without organ or systems involvement: Secondary | ICD-10-CM

## 2021-09-19 DIAGNOSIS — J302 Other seasonal allergic rhinitis: Secondary | ICD-10-CM

## 2021-09-19 DIAGNOSIS — Z79899 Other long term (current) drug therapy: Secondary | ICD-10-CM

## 2021-09-19 DIAGNOSIS — L80 Vitiligo: Secondary | ICD-10-CM | POA: Diagnosis not present

## 2021-09-19 DIAGNOSIS — M778 Other enthesopathies, not elsewhere classified: Secondary | ICD-10-CM

## 2021-09-19 MED ORDER — SIMPONI 50 MG/0.5ML ~~LOC~~ SOSY
50.0000 mg | PREFILLED_SYRINGE | SUBCUTANEOUS | 0 refills | Status: DC
  Filled 2021-09-19: qty 1.5, fill #0

## 2021-09-19 MED ORDER — SIMPONI 50 MG/0.5ML ~~LOC~~ SOSY
50.0000 mg | PREFILLED_SYRINGE | SUBCUTANEOUS | 0 refills | Status: DC
  Filled 2021-09-19: qty 0.5, 28d supply, fill #0
  Filled 2021-10-13: qty 0.5, 28d supply, fill #1
  Filled 2021-11-06: qty 0.5, 28d supply, fill #2

## 2021-09-19 NOTE — Patient Instructions (Addendum)
Standing Labs We placed an order today for your standing lab work.   Please have your standing labs drawn in February and every 3 months  If possible, please have your labs drawn 2 weeks prior to your appointment so that the provider can discuss your results at your appointment.  Please note that you may see your imaging and lab results in [place] before we have reviewed them. We may be awaiting multiple results to interpret others before contacting you. Please allow our office up to 72 hours to thoroughly review all of the results before contacting the office for clarification of your results.  We have open lab daily: Monday through Thursday from 1:30-4:30 PM and Friday from 1:30-4:00 PM at the office of Dr. Bo Merino, [facility].   Please be advised, all patients with office appointments requiring lab work will take precedent over walk-in lab work.  If possible, please come for your lab work on Monday and Friday afternoons, as you may experience shorter wait times. The office is located at [address] [place], [place], [place] [address] No appointment is necessary.   Labs are drawn by [facility]. Please bring your co-pay at the time of your lab draw.  You may receive a bill from [facility] for your lab work.  Please note if you are on Hydroxychloroquine and and an order has been placed for a Hydroxychloroquine level, you will need to have it drawn 4 hours or more after your last dose.  If you wish to have your labs drawn at another location, please call the office 24 hours in advance to send orders.  If you have any questions regarding directions or hours of operation,  please call [phone].   As a reminder, please drink plenty of water prior to coming for your lab work. Thanks!   Vaccines You are taking a medication(s) that can suppress your immune system.  The following immunizations are recommended: Flu annually Covid-19  Td/Tdap (tetanus, diphtheria,  pertussis) every 10 years Pneumonia (Prevnar 15 then Pneumovax 23 at least 1 year apart.  Alternatively, can take Prevnar 20 without needing additional dose) Shingrix: 2 doses from 4 weeks to 6 months apart  Please check with your PCP to make sure you are up to date.   If you have signs or symptoms of an infection or start antibiotics: First, call your PCP for workup of your infection. Hold your medication through the infection, until you complete your antibiotics, and until symptoms resolve if you take the following: Injectable medication (Actemra, Benlysta, Cimzia, Cosentyx, Enbrel, Humira, Kevzara, Orencia, Remicade, Simponi, Stelara, Taltz, Tremfya) Methotrexate Leflunomide (Arava) Mycophenolate (Cellcept) Roma Kayser, or Rinvoq   Please get annual skin examination by dermatologist to screen for skin cancer you are on Simponi

## 2021-09-19 NOTE — Telephone Encounter (Signed)
Next Visit: 09/19/2021   Last Visit: 05/31/2021   Current Dose per office note on 05/31/2021: Simponi 50 mg subcu every 28 days   TX:LEZVGJFTNB arthritis involving multiple sites with positive rheumatoid factor   Last Fill: 08/29/2021 (30 day supply)  Labs:06/26/2021 RBC 5.88 MCH 25.7 Platelets 103 Neutrophils (Absolute) 1.1 Hgb A1C 6.0 07/05/2021 CBC stable.  TB Gold: negative on October 04, 2020.  Okay to refill Simponi?

## 2021-09-26 ENCOUNTER — Other Ambulatory Visit: Payer: Self-pay | Admitting: *Deleted

## 2021-09-26 DIAGNOSIS — Z79899 Other long term (current) drug therapy: Secondary | ICD-10-CM

## 2021-09-27 NOTE — Progress Notes (Signed)
CBC and CMP are normal.

## 2021-09-28 DIAGNOSIS — M5442 Lumbago with sciatica, left side: Secondary | ICD-10-CM | POA: Diagnosis not present

## 2021-09-28 DIAGNOSIS — M5136 Other intervertebral disc degeneration, lumbar region: Secondary | ICD-10-CM | POA: Diagnosis not present

## 2021-09-28 DIAGNOSIS — M62830 Muscle spasm of back: Secondary | ICD-10-CM | POA: Diagnosis not present

## 2021-09-28 DIAGNOSIS — M546 Pain in thoracic spine: Secondary | ICD-10-CM | POA: Diagnosis not present

## 2021-09-29 LAB — CBC WITH DIFFERENTIAL/PLATELET
Absolute Monocytes: 779 cells/uL (ref 200–950)
Basophils Absolute: 62 cells/uL (ref 0–200)
Basophils Relative: 1.4 %
Eosinophils Absolute: 326 cells/uL (ref 15–500)
Eosinophils Relative: 7.4 %
HCT: 43.3 % (ref 38.5–50.0)
Hemoglobin: 14 g/dL (ref 13.2–17.1)
Lymphs Abs: 1456 cells/uL (ref 850–3900)
MCH: 25.8 pg — ABNORMAL LOW (ref 27.0–33.0)
MCHC: 32.3 g/dL (ref 32.0–36.0)
MCV: 79.9 fL — ABNORMAL LOW (ref 80.0–100.0)
MPV: 13.8 fL — ABNORMAL HIGH (ref 7.5–12.5)
Monocytes Relative: 17.7 %
Neutro Abs: 1778 cells/uL (ref 1500–7800)
Neutrophils Relative %: 40.4 %
Platelets: 155 10*3/uL (ref 140–400)
RBC: 5.42 10*6/uL (ref 4.20–5.80)
RDW: 13.6 % (ref 11.0–15.0)
Total Lymphocyte: 33.1 %
WBC: 4.4 10*3/uL (ref 3.8–10.8)

## 2021-09-29 LAB — QUANTIFERON-TB GOLD PLUS
Mitogen-NIL: >10 IU/mL
NIL: 0.04 IU/mL
QuantiFERON-TB Gold Plus: NEGATIVE
TB1-NIL: 0 IU/mL
TB2-NIL: 0.01 IU/mL

## 2021-09-29 LAB — COMPLETE METABOLIC PANEL WITH GFR
AG Ratio: 1.2 (calc) (ref 1.0–2.5)
ALT: 21 U/L (ref 9–46)
AST: 23 U/L (ref 10–35)
Albumin: 4.2 g/dL (ref 3.6–5.1)
Alkaline phosphatase (APISO): 71 U/L (ref 35–144)
BUN: 14 mg/dL (ref 7–25)
CO2: 30 mmol/L (ref 20–32)
Calcium: 9.6 mg/dL (ref 8.6–10.3)
Chloride: 102 mmol/L (ref 98–110)
Creat: 1.26 mg/dL (ref 0.70–1.30)
Globulin: 3.5 g/dL (calc) (ref 1.9–3.7)
Glucose, Bld: 80 mg/dL (ref 65–99)
Potassium: 4.1 mmol/L (ref 3.5–5.3)
Sodium: 140 mmol/L (ref 135–146)
Total Bilirubin: 0.5 mg/dL (ref 0.2–1.2)
Total Protein: 7.7 g/dL (ref 6.1–8.1)
eGFR: 66 mL/min/{1.73_m2} (ref >=60)

## 2021-09-29 NOTE — Progress Notes (Signed)
TB Gold is negative.

## 2021-09-30 ENCOUNTER — Other Ambulatory Visit: Payer: Self-pay | Admitting: Physician Assistant

## 2021-10-02 ENCOUNTER — Other Ambulatory Visit (HOSPITAL_COMMUNITY): Payer: Self-pay

## 2021-10-02 MED ORDER — LEFLUNOMIDE 10 MG PO TABS
ORAL_TABLET | Freq: Every day | ORAL | 0 refills | Status: DC
  Filled 2021-10-02: qty 90, 90d supply, fill #0

## 2021-10-02 NOTE — Telephone Encounter (Signed)
Next Visit: 02/22/2022  Last Visit: 09/19/2021  Last Fill: 07/12/2021  DX: Rheumatoid arthritis involving multiple sites with positive rheumatoid factor  Current Dose per office note 09/19/2021: Arava 10 mg 1 tablet daily  Labs: 09/26/2021 CBC and CMP are normal.  Okay to refill Arava?

## 2021-10-03 ENCOUNTER — Other Ambulatory Visit (HOSPITAL_COMMUNITY): Payer: Self-pay

## 2021-10-13 ENCOUNTER — Other Ambulatory Visit (HOSPITAL_COMMUNITY): Payer: Self-pay

## 2021-10-16 ENCOUNTER — Other Ambulatory Visit (HOSPITAL_COMMUNITY): Payer: Self-pay

## 2021-10-21 ENCOUNTER — Other Ambulatory Visit: Payer: Self-pay | Admitting: Physician Assistant

## 2021-10-21 ENCOUNTER — Other Ambulatory Visit (HOSPITAL_COMMUNITY): Payer: Self-pay

## 2021-10-23 ENCOUNTER — Other Ambulatory Visit (HOSPITAL_COMMUNITY): Payer: Self-pay

## 2021-10-23 MED ORDER — OMEPRAZOLE 40 MG PO CPDR
40.0000 mg | DELAYED_RELEASE_CAPSULE | Freq: Every day | ORAL | 0 refills | Status: DC
  Filled 2021-10-23: qty 90, 90d supply, fill #0

## 2021-10-23 MED ORDER — FAMOTIDINE 40 MG PO TABS
40.0000 mg | ORAL_TABLET | Freq: Every morning | ORAL | 2 refills | Status: DC
  Filled 2021-10-23: qty 90, 90d supply, fill #0
  Filled 2022-01-29: qty 90, 90d supply, fill #1
  Filled 2022-05-08: qty 90, 90d supply, fill #2

## 2021-10-23 NOTE — Telephone Encounter (Signed)
Next Visit: 02/22/2022 ?  ?Last Visit: 09/19/2021 ?  ?Last Fill: ? ?Dx: History of gastroesophageal reflux (GERD) ? ?Current Dose per office note on 09/19/2021: not discussed ? ?Okay to refill omeprazole?  ?

## 2021-10-26 DIAGNOSIS — M62830 Muscle spasm of back: Secondary | ICD-10-CM | POA: Diagnosis not present

## 2021-10-26 DIAGNOSIS — M5136 Other intervertebral disc degeneration, lumbar region: Secondary | ICD-10-CM | POA: Diagnosis not present

## 2021-10-26 DIAGNOSIS — M546 Pain in thoracic spine: Secondary | ICD-10-CM | POA: Diagnosis not present

## 2021-10-26 DIAGNOSIS — M5442 Lumbago with sciatica, left side: Secondary | ICD-10-CM | POA: Diagnosis not present

## 2021-10-27 ENCOUNTER — Other Ambulatory Visit (HOSPITAL_COMMUNITY): Payer: Self-pay

## 2021-10-30 ENCOUNTER — Other Ambulatory Visit (HOSPITAL_COMMUNITY): Payer: Self-pay

## 2021-10-31 ENCOUNTER — Other Ambulatory Visit (HOSPITAL_COMMUNITY): Payer: Self-pay

## 2021-10-31 DIAGNOSIS — J309 Allergic rhinitis, unspecified: Secondary | ICD-10-CM | POA: Diagnosis not present

## 2021-10-31 DIAGNOSIS — J45991 Cough variant asthma: Secondary | ICD-10-CM | POA: Diagnosis not present

## 2021-10-31 MED ORDER — ALBUTEROL SULFATE HFA 108 (90 BASE) MCG/ACT IN AERS
INHALATION_SPRAY | RESPIRATORY_TRACT | 11 refills | Status: DC
  Filled 2021-10-31: qty 18, 16d supply, fill #0
  Filled 2022-01-26: qty 18, 16d supply, fill #1

## 2021-10-31 MED ORDER — FLUTICASONE PROPIONATE 50 MCG/ACT NA SUSP
NASAL | 11 refills | Status: DC
  Filled 2021-10-31: qty 16, 30d supply, fill #0

## 2021-11-06 ENCOUNTER — Other Ambulatory Visit (HOSPITAL_COMMUNITY): Payer: Self-pay

## 2021-11-09 ENCOUNTER — Other Ambulatory Visit (HOSPITAL_COMMUNITY): Payer: Self-pay

## 2021-11-20 DIAGNOSIS — M62830 Muscle spasm of back: Secondary | ICD-10-CM | POA: Diagnosis not present

## 2021-11-20 DIAGNOSIS — M5136 Other intervertebral disc degeneration, lumbar region: Secondary | ICD-10-CM | POA: Diagnosis not present

## 2021-11-20 DIAGNOSIS — M546 Pain in thoracic spine: Secondary | ICD-10-CM | POA: Diagnosis not present

## 2021-11-20 DIAGNOSIS — M5442 Lumbago with sciatica, left side: Secondary | ICD-10-CM | POA: Diagnosis not present

## 2021-11-24 ENCOUNTER — Other Ambulatory Visit (HOSPITAL_COMMUNITY): Payer: Self-pay

## 2021-11-27 ENCOUNTER — Other Ambulatory Visit (HOSPITAL_COMMUNITY): Payer: Self-pay

## 2021-11-27 DIAGNOSIS — M5442 Lumbago with sciatica, left side: Secondary | ICD-10-CM | POA: Diagnosis not present

## 2021-11-27 DIAGNOSIS — M62830 Muscle spasm of back: Secondary | ICD-10-CM | POA: Diagnosis not present

## 2021-11-27 DIAGNOSIS — M5136 Other intervertebral disc degeneration, lumbar region: Secondary | ICD-10-CM | POA: Diagnosis not present

## 2021-11-27 DIAGNOSIS — M546 Pain in thoracic spine: Secondary | ICD-10-CM | POA: Diagnosis not present

## 2021-12-04 ENCOUNTER — Other Ambulatory Visit (HOSPITAL_COMMUNITY): Payer: Self-pay

## 2021-12-05 ENCOUNTER — Other Ambulatory Visit (HOSPITAL_COMMUNITY): Payer: Self-pay

## 2021-12-05 ENCOUNTER — Other Ambulatory Visit: Payer: Self-pay | Admitting: Pharmacist

## 2021-12-05 ENCOUNTER — Other Ambulatory Visit: Payer: Self-pay | Admitting: Physician Assistant

## 2021-12-05 MED ORDER — SIMPONI 50 MG/0.5ML ~~LOC~~ SOSY
PREFILLED_SYRINGE | SUBCUTANEOUS | 2 refills | Status: DC
  Filled 2021-12-05: qty 0.5, fill #0

## 2021-12-05 MED ORDER — SIMPONI 50 MG/0.5ML ~~LOC~~ SOSY
PREFILLED_SYRINGE | SUBCUTANEOUS | 2 refills | Status: DC
  Filled 2021-12-05: qty 0.5, 28d supply, fill #0
  Filled 2022-01-02: qty 0.5, 28d supply, fill #1
  Filled 2022-01-29: qty 0.5, 28d supply, fill #2

## 2021-12-05 NOTE — Telephone Encounter (Signed)
Next Visit: 02/22/2022 ? ?Last Visit: 09/19/2021 ? ?Last Fill: 09/19/2021 ? ?DX: Rheumatoid arthritis involving multiple sites with positive rheumatoid factor ? ?Current Dose per office note 09/19/2021:  Simponi 50 mg subcu every 28 days  ? ?Labs: 09/26/2021 CBC and CMP are normal.  ? ?TB Gold: 09/26/2021 Neg   ? ?Okay to refill Simponi?  ?

## 2021-12-08 ENCOUNTER — Other Ambulatory Visit (HOSPITAL_COMMUNITY): Payer: Self-pay

## 2021-12-09 ENCOUNTER — Other Ambulatory Visit (HOSPITAL_COMMUNITY): Payer: Self-pay

## 2021-12-11 ENCOUNTER — Other Ambulatory Visit (HOSPITAL_COMMUNITY): Payer: Self-pay

## 2021-12-12 ENCOUNTER — Other Ambulatory Visit (HOSPITAL_COMMUNITY): Payer: Self-pay

## 2021-12-12 ENCOUNTER — Telehealth: Payer: Self-pay

## 2021-12-12 NOTE — Telephone Encounter (Signed)
Per [facility] pt's medication requires PA. Attempted to submit a PA on 12/08/21, however request was canceled due to being a duplicate request (PA was renewed on 08/30/2021 until 08/28/2022). Despite this, medication still claims a PA is required. ? ?Reached out to [employer] to determine what the problem is. Per rep, PA is for a specific NDC. Upon closer inspection, PA is submitted for Auto-injector as the PFS formulation was on backorder back in January. Pt only filled the AJ ONCE on 08/30/21 before switching back to the PFS formulation.  ? ?Submitted an URGENT Prior Authorization request to [facility] for SIMPONI PFS via CoverMyMeds. Will update once we receive a response. ? ? ?Key: [id number] ? ?

## 2021-12-13 ENCOUNTER — Other Ambulatory Visit (HOSPITAL_COMMUNITY): Payer: Self-pay

## 2021-12-13 NOTE — Telephone Encounter (Addendum)
Received notification from [facility] regarding a prior authorization for SIMPONI PFS.  Authorization has been APPROVED from 12/12/21 to 12/12/22.  ? ?Patient must continue to fill through [facility]: [phone]  ? ?Authorization # [id number] ? ?Chesley Mires, PharmD, MPH, BCPS ?Clinical Pharmacist (Rheumatology and Pulmonology) ?

## 2021-12-14 ENCOUNTER — Other Ambulatory Visit (HOSPITAL_COMMUNITY): Payer: Self-pay

## 2021-12-18 ENCOUNTER — Other Ambulatory Visit (HOSPITAL_BASED_OUTPATIENT_CLINIC_OR_DEPARTMENT_OTHER): Payer: Self-pay

## 2021-12-18 MED ORDER — PREDNISONE 5 MG PO TABS
ORAL_TABLET | ORAL | 0 refills | Status: DC
  Filled 2021-12-18: qty 21, 6d supply, fill #0

## 2021-12-22 ENCOUNTER — Other Ambulatory Visit (HOSPITAL_COMMUNITY): Payer: Self-pay

## 2021-12-28 DIAGNOSIS — M546 Pain in thoracic spine: Secondary | ICD-10-CM | POA: Diagnosis not present

## 2021-12-28 DIAGNOSIS — M5136 Other intervertebral disc degeneration, lumbar region: Secondary | ICD-10-CM | POA: Diagnosis not present

## 2021-12-28 DIAGNOSIS — M5442 Lumbago with sciatica, left side: Secondary | ICD-10-CM | POA: Diagnosis not present

## 2021-12-28 DIAGNOSIS — M62830 Muscle spasm of back: Secondary | ICD-10-CM | POA: Diagnosis not present

## 2021-12-30 ENCOUNTER — Other Ambulatory Visit: Payer: Self-pay | Admitting: Physician Assistant

## 2021-12-30 DIAGNOSIS — Z79899 Other long term (current) drug therapy: Secondary | ICD-10-CM

## 2022-01-01 ENCOUNTER — Other Ambulatory Visit (HOSPITAL_COMMUNITY): Payer: Self-pay

## 2022-01-01 MED ORDER — LEFLUNOMIDE 10 MG PO TABS
ORAL_TABLET | Freq: Every day | ORAL | 0 refills | Status: DC
  Filled 2022-01-01: qty 90, 90d supply, fill #0

## 2022-01-01 NOTE — Progress Notes (Signed)
? ?NEW PATIENT ?Date of Service/Encounter:  01/03/22 ?Referring provider: Ailene Ravel, MD ?Primary care provider: Lonie Peak, PA-C ? ?Subjective:  ?Timothy Waters is a 61 y.o. male with a PMHx of RA, vitiligo, GERD presenting today for evaluation of chronic rhinitis. ?History obtained from: chart review and patient. ?  ?Chronic rhinitis: started years ago ?Symptoms include:  sniffles, nasal congestion.  His eyes don't bother him, occasionally the corner of his eyes itches during Spring  His voice is sometimes affected. ?Occurs year-round with seasonal flares, used to worse in the Fall.   ?Potential triggers: Dust mites, outdoor pollens ?Treatments tried: zyrtec every night, systane eye drops ?Previous allergy testing: no ?History of reflux/heartburn:  yes-prescribed pepcid 40 mg nightly, prilosec (omeprazole) 40 mg daily -taking for years ? ?He is a singer, and has a history of reflux.  His hoarseness and voice issues seemed to have improved most with Flovent.  He has tried nasal steroids in the past, but when he uses them consistently this triggers an upper airway cough. ? ?Asthma History:  ?-Diagnosed a few years ago. ?-Current symptoms include chest tightness and wheezing, used to have nighttime cough and this is when he would have to use the ventolin, this was over a year ago ?He hasn't used ventolin in months ?-Limitations to daily activity: none ?- no oral steroids in the past year for his breathing ?- 0 number of lifetime hospitalizations, 0 number of lifetime intubations.  ?- Up-to-date with Covid-19, and Flu, vaccines. ?- History of prior pneumonias: 0 ?- History of prior COVID-19 infection: 0 ?- Smoking exposure: 0 ?Previous Diagnostics:  ?- Most Recent AEC (09/26/21): 326 ?-No dedicated chest imaging found on chart review ?Management:  ?- Current regimen:  ?- Maintenance: Flovent 220 2 puffs BID ?- Rescue: Albuterol 2 puffs q4-6 hrs PRN, none prior to exercise ? ?Other allergy screening: ?Food  allergy: no, avoids red meat, pasta, peppers, white rice and potatoes because it flares up his RA ?Medication allergy:  sulfa-years ago, 30 or more, woke up with itchy ankles, next morning woke in full body hives, and resolved in 24 hours after getting a shot ?Hymenoptera allergy: no ?Urticaria: no ?Eczema:no ? ?Past Medical History: ?Past Medical History:  ?Diagnosis Date  ? Allergy   ? Arthritis   ? Asthma   ? ?Medication List:  ?Current Outpatient Medications  ?Medication Sig Dispense Refill  ? albuterol (VENTOLIN HFA) 108 (90 Base) MCG/ACT inhaler Inhale 2 puffs into the lungs every 4 hours as needed for cough/wheezing 18 g 11  ? Cetirizine HCl (ZYRTEC PO) Take 0.5 tablets by mouth daily.    ? Cyanocobalamin (VITAMIN B-12 PO) Take by mouth daily.    ? Docusate Calcium (STOOL SOFTENER PO) Take 2 tablets by mouth at bedtime.    ? famotidine (PEPCID) 40 MG tablet Take 1 tablet by mouth every morning for antacid 90 tablet 2  ? fluticasone (FLONASE) 50 MCG/ACT nasal spray Place 2 sprays in each nostril daily 16 g 11  ? fluticasone (FLOVENT HFA) 220 MCG/ACT inhaler Inhale 2 (two) puffs two times daily. 12 g 5  ? folic acid (FOLVITE) 1 MG tablet TAKE 2 TABLETS BY MOUTH  DAILY 180 tablet 3  ? Folic Acid-B2-B6-B12-D-Ca-Phos (FOLGARD PO) Take 2 tablets by mouth daily. Dietary supplement    ? Golimumab (SIMPONI) 50 MG/0.5ML SOSY INJECT 50MG  SUBCUTANEOUSLY MONTHLY. 0.5 mL 2  ? leflunomide (ARAVA) 10 MG tablet TAKE 1 TABLET BY MOUTH ONCE A DAY 90 tablet 0  ?  omeprazole (PRILOSEC) 40 MG capsule TAKE 1 CAPSULE BY MOUTH  DAILY 90 capsule 0  ? Polyethyl Glycol-Propyl Glycol (SYSTANE ULTRA) 0.4-0.3 % SOLN Place 1 drop into both eyes 3 (three) times daily. (Patient taking differently: Place 1 drop into both eyes as needed.) 20 mL 6  ? TURMERIC PO Take by mouth.    ? UNABLE TO FIND Take 3 tablets by mouth daily. Heal ans Soothe Vitamin    ? diclofenac Sodium (VOLTAREN) 1 % GEL Apply 2-4 grams to affected joint up to 4 times daily  as needed (Patient not taking: Reported on 01/03/2022) 100 g 2  ? predniSONE (DELTASONE) 5 MG tablet Take as directed (Patient not taking: Reported on 01/03/2022) 21 tablet 0  ? ?No current facility-administered medications for this visit.  ? ?Known Allergies:  ?Allergies  ?Allergen Reactions  ? Sulfa Antibiotics Hives  ? ?Past Surgical History: ?Past Surgical History:  ?Procedure Laterality Date  ? APPENDECTOMY  11/17/2018  ? CARPAL TUNNEL RELEASE Left 2002  ? MANDIBLE SURGERY  1994  ? ?Family History: ?Family History  ?Problem Relation Age of Onset  ? Diabetes Mother   ? Cancer Mother   ? COPD Father   ? Diabetes Father   ? ?Social History: Timothy Waters lives in a home built 30 years ago, no water damage, carpet in bedroom, gas heating, central AC, no pets, no DM protection on bedding, no smoke exposure, works as a [employer] fast streamer, exposed to dust, + HEPA filter.  ? ?ROS:  ?All other systems negative except as noted per HPI. ? ?Objective:  ?Blood pressure (!) 142/84, pulse 90, temperature 98.7 ?F (37.1 ?C), temperature source Temporal, resp. rate 16, height 5' 9.5" (1.765 m), weight 187 lb 6.4 oz (85 kg), SpO2 97 %. ?Body mass index is 27.28 kg/m?Marland Kitchen ?Physical Exam: ? ?General Appearance:  Alert, cooperative, no distress, appears stated age  ?Head:  Normocephalic, without obvious abnormality, atraumatic  ?Eyes:  Conjunctiva clear, EOM's intact  ?Nose: Nares normal, hypertrophic turbinates, normal mucosa, and no visible anterior polyps  ?Throat: Lips, tongue normal; teeth and gums normal,  tonsils 1+ and normal posterior oropharynx  ?Neck: Supple, symmetrical  ?Lungs:   clear to auscultation bilaterally, Respirations unlabored, no coughing  ?Heart:  regular rate and rhythm and no murmur, Appears well perfused  ?Extremities: No edema  ?Skin: Skin color, texture, turgor normal, no rashes or lesions on visualized portions of skin  ?Neurologic: No gross deficits  ? ? ? ?Diagnostics: ?Spirometry:  ?Tracings reviewed. His effort:  Good reproducible efforts. ?FVC: 3.72L  ?FEV1: 3.11L, 103% predicted  ?FEV1/FVC ratio: 108%  ?Interpretation: Spirometry consistent with normal pattern  ? ?Skin Testing: Environmental allergy panel. ? Adequate controls. ?Results discussed with patient/family. ? Airborne Adult Perc - 01/03/22 1625   ? ? Time Antigen Placed 1625   ? Allergen Manufacturer Waynette Buttery   ? Location Back   ? Number of Test 59   ? Panel 1 Select   ? 2. Control-Histamine 1 mg/ml 3+   ? 4. Bahia Negative   ? 5. French Southern Territ[place] Negative   ? 6. Johnson Negative   ? 7. Kentucky Blue Negative   ? 8. Meadow Fescue Negative   ? 9. Perennial Rye Negative   ? 10. Sweet Vernal Negative   ? 11. Timothy Negative   ? 12. Cocklebur Negative   ? 13. Burweed Marshelder Negative   ? 14. Ragweed, short Negative   ? 15. Ragweed, Giant Negative   ? 16. Plantain,  English Negative   ?  17. Lamb's Quarters Negative   ? 18. Sheep Sorrell Negative   ? 19. Rough Pigweed Negative   ? 20. Marsh Elder, Rough Negative   ? 21. Mugwort, Common Negative   ? 22. Ash mix Negative   ? 23. Charletta CousinBirch mix Negative   ? 24. Beech American Negative   ? 25. Box, Elder 3+   ? 26. Cedar, red 4+   ? 27. Cottonwood, Guinea-BissauEastern Negative   ? 28. Elm mix Negative   ? 29. Hickory Negative   ? 30. Maple mix Negative   ? 31. Oak, Guinea-BissauEastern mix Negative   ? 32. Pecan Pollen Negative   ? 33. Pine mix Negative   ? 34. Sycamore Eastern Negative   ? 35. Walnut, Black Pollen Negative   ? 36. Alternaria alternata Negative   ? 37. Cladosporium Herbarum Negative   ? 38. Aspergillus mix Negative   ? 39. Penicillium mix Negative   ? 40. Bipolaris sorokiniana (Helminthosporium) Negative   ? 41. Drechslera spicifera (Curvularia) 3+   ? 42. Mucor plumbeus Negative   ? 43. Fusarium moniliforme Negative   ? 44. Aureobasidium pullulans (pullulara) Negative   ? 45. Rhizopus oryzae Negative   ? 46. Botrytis cinera Negative   ? 47. Epicoccum nigrum Negative   ? 48. Phoma betae Negative   ? 49. Candida Albicans Negative   ? 50.  Trichophyton mentagrophytes 3+   ? 51. Mite, D Farinae  5,000 AU/ml Negative   ? 52. Mite, D Pteronyssinus  5,000 AU/ml Negative   ? 53. Cat Hair 10,000 BAU/ml Negative   ? 54.  Dog Epithelia Negative   ? 55. Mi

## 2022-01-01 NOTE — Telephone Encounter (Signed)
Next Visit: 02/22/2022 ?  ?Last Visit: 09/19/2021 ?  ?Last Fill: 10/02/2021  ?  ?DX: Rheumatoid arthritis involving multiple sites with positive rheumatoid factor ?  ?Current Dose per office note 09/19/2021: Arava 10 mg 1 tablet daily ?  ?Labs: 09/26/2021 CBC and CMP are normal. ? ?Patient advised he is due to update labs. Patient states he will update this week.  ?  ?Okay to refill Arava?  ?

## 2022-01-02 ENCOUNTER — Other Ambulatory Visit (HOSPITAL_COMMUNITY): Payer: Self-pay

## 2022-01-03 ENCOUNTER — Encounter: Payer: Self-pay | Admitting: Internal Medicine

## 2022-01-03 ENCOUNTER — Ambulatory Visit: Payer: 59 | Admitting: Internal Medicine

## 2022-01-03 ENCOUNTER — Other Ambulatory Visit (HOSPITAL_COMMUNITY): Payer: Self-pay

## 2022-01-03 VITALS — BP 142/84 | HR 90 | Temp 98.7°F | Resp 16 | Ht 69.5 in | Wt 187.4 lb

## 2022-01-03 DIAGNOSIS — K219 Gastro-esophageal reflux disease without esophagitis: Secondary | ICD-10-CM | POA: Diagnosis not present

## 2022-01-03 DIAGNOSIS — H1013 Acute atopic conjunctivitis, bilateral: Secondary | ICD-10-CM | POA: Diagnosis not present

## 2022-01-03 DIAGNOSIS — Z882 Allergy status to sulfonamides status: Secondary | ICD-10-CM | POA: Insufficient documentation

## 2022-01-03 DIAGNOSIS — J3089 Other allergic rhinitis: Secondary | ICD-10-CM | POA: Insufficient documentation

## 2022-01-03 DIAGNOSIS — J310 Chronic rhinitis: Secondary | ICD-10-CM

## 2022-01-03 DIAGNOSIS — J4520 Mild intermittent asthma, uncomplicated: Secondary | ICD-10-CM | POA: Diagnosis not present

## 2022-01-03 MED ORDER — OLOPATADINE HCL 0.2 % OP SOLN
1.0000 [drp] | Freq: Every day | OPHTHALMIC | 5 refills | Status: DC | PRN
  Filled 2022-01-03: qty 2.5, 25d supply, fill #0

## 2022-01-03 NOTE — Patient Instructions (Addendum)
Chronic Rhinitis -seasonal and perennial allergic: ?- allergy testing today was positive to tree pollen, outdoor and indoor molds, and dust mites ?- allergen avoidance as below ?- Continue over the counter antihistamine daily or daily as needed.   ?-Your options include Zyrtec (Cetirizine) , Claritin (Loratadine) , Allegra (Fexofenadine) , or Xyzal (Levocetirinze)  ?- can use nasal saline spray as needed to help clear mucus and keep nose and upper airway mucosa moistened ? ?Allergic Conjunctivitis:  ?- Consider Allergy Eye drops: great options include Pataday (Olopatadine) or Zaditor (ketotifen) for eye symptoms daily as needed-both sold over the counter if not covered by insurance.   ?-Avoid eye drops that say red eye relief as they may contain medications that dry out your eyes. ? ? Asthma: ?- your lung testing today looked great ?- Controller Inhaler: Continue Flovent 220 2 puffs twice a day; This Should Be Used Everyday ?- Rinse mouth out after use ?- Rescue Inhaler: Albuterol (Proair/Ventolin) 2 puffs . Use  every 4-6 hours as needed for chest tightness, wheezing, or coughing.  Can also use 15 minutes prior to exercise if you have symptoms with activity. ?- Asthma is not controlled if: ? - Symptoms are occurring >2 times a week OR ? - >2 times a month nighttime awakenings ? - You are requiring systemic steroids (prednisone/steroid injections) more than once per year ? - Your require hospitalization for your asthma. ? - Please call the clinic to schedule a follow up if these symptoms arise ? ?Reflux:  ?- continue your omeprazole 40 mg in the morning (take 20 minutes before your breakfast) and pepcid 40 mg at night ?- lifestyle and diet modifications as below ? ?Follow-up in 4-6 months, sooner if needed.  ?------------------------------------------------------------------------------------------------------------ ? ?Gastroesophageal Reflux Induced Respiratory Disease and Laryngopharyngeal Reflux  (LPR): ?Gastroesophageal reflux disease (GERD) is a condition where the contents of the stomach reflux or back up into the esophagus or swallowing tube.  This can result in a variety of clinical symptoms including classic symptoms and atypical symptoms.  Classic symptoms of GERD include: heartburn, chest pain, acid taste in the mouth, and difficulty in swallowing.  Atypical symptoms of GERD include laryngopharyngeal reflux (LPR) and asthma. ? ?LPR occurs when stomach reflux comes all the way up to the throat.  Clinical symptoms include hoarseness, raspy voice, laryngitis, throat clearing, postnasal drip, mucus stuck in the throat, a sensation of a lump in the throat, sore throat, and cough.  Most patients with LPR do not have classic symptoms of GERD. ? ?Asthma can also be triggered by GERD.  The acid stomach fluid can stimulate nerve fibers in the esophagus which can cause an increase in bronchial muscle tone and narrowing of the airways.  Acid stomach contents may also reflux into the trachea and bronchi of the lungs where it can trigger an asthma attack.  Many people with GERD triggered asthma do not have classic symptoms of GERD. ? ?Diagnosis of LPR and GERD induced asthma is frequently made from a typical history and response to medications.  It may take several months of medications to see a good response.  Occasionally, a 24-hour esophageal pH probe study must be performed. ? ?Treatment of GERD/LPR includes: ?  ?Modification of diet and lifestyle ?Stop smoking ?Avoid overeating and lose weight ?Avoid acidic and fatty foods, chocolate, onions, garlic, peppermint ?Elevate the head of your bed 6 to 8 inches with blocks or wedge ?Medications ?Zantac, Pepcid, Axid, Tagamet ?Prilosec, Prevacid, Aciphex, Protonix, Nexium ?Surgery ? ?Reducing  Pollen Exposure ? ?The American Academy of Allergy, Asthma and Immunology suggests the following steps to reduce your exposure to pollen during allergy seasons. ?   ?Do not hang  sheets or clothing out to dry; pollen may collect on these items. ?Do not mow lawns or spend time around freshly cut grass; mowing stirs up pollen. ?Keep windows closed at night.  Keep car windows closed while driving. ?Minimize morning activities outdoors, a time when pollen counts are usually at their highest. ?Stay indoors as much as possible when pollen counts or humidity is high and on windy days when pollen tends to remain in the air longer. ?Use air conditioning when possible.  Many air conditioners have filters that trap the pollen spores. ?Use a HEPA room air filter to remove pollen form the indoor air you breathe. ? ?Control of Mold Allergen  ? ?Mold and fungi can grow on a variety of surfaces provided certain temperature and moisture conditions exist.  Outdoor molds grow on plants, decaying vegetation and soil.  The major outdoor mold, Alternaria and Cladosporium, are found in very high numbers during hot and dry conditions.  Generally, a late Summer - Fall peak is seen for common outdoor fungal spores.  Rain will temporarily lower outdoor mold spore count, but counts rise rapidly when the rainy period ends.  The most important indoor molds are Aspergillus and Penicillium.  Dark, humid and poorly ventilated basements are ideal sites for mold growth.  The next most common sites of mold growth are the bathroom and the kitchen. ? ?Outdoor (Seasonal) Mold Control ? ?Use air conditioning and keep windows closed ?Avoid exposure to decaying vegetation. ?Avoid leaf raking. ?Avoid grain handling. ?Consider wearing a face mask if working in moldy areas.  ? ? ?Indoor (Perennial) Mold Control  ? ?Maintain humidity below 50%. ?Clean washable surfaces with 5% bleach solution. ?Remove sources e.g. contaminated carpets. ? ? ? ?DUST MITE AVOIDANCE MEASURES: ? ?There are three main measures that need and can be taken to avoid house dust mites: ? ?Reduce accumulation of dust in general ?-reduce furniture, clothing,  carpeting, books, stuffed animals, especially in bedroom ? ?Separate yourself from the dust ?-use pillow and mattress encasements (can be found at stores such as [employer] or online) ?-avoid direct exposure to air condition flow ?-use a HEPA filter device, especially in the bedroom; you can also use a HEPA filter vacuum cleaner ?-wipe dust with a moist towel instead of a dry towel or broom when cleaning ? ?Decrease mites and/or their secretions ?-wash clothing and linen and stuffed animals at highest temperature possible, at least every 2 weeks ?-stuffed animals can also be placed in a bag and put in a freezer overnight ? ?Despite the above measures, it is impossible to eliminate dust mites or their allergen completely from your home.  With the above measures the burden of mites in your home can be diminished, with the goal of minimizing your allergic symptoms.  Success will be reached only when implementing and using all means together. ? ? ?

## 2022-01-04 ENCOUNTER — Other Ambulatory Visit: Payer: Self-pay

## 2022-01-04 ENCOUNTER — Other Ambulatory Visit (HOSPITAL_COMMUNITY): Payer: Self-pay

## 2022-01-04 DIAGNOSIS — Z79899 Other long term (current) drug therapy: Secondary | ICD-10-CM

## 2022-01-05 ENCOUNTER — Other Ambulatory Visit (HOSPITAL_BASED_OUTPATIENT_CLINIC_OR_DEPARTMENT_OTHER): Payer: Self-pay

## 2022-01-05 LAB — COMPLETE METABOLIC PANEL WITH GFR
AG Ratio: 1.3 (calc) (ref 1.0–2.5)
ALT: 18 U/L (ref 9–46)
AST: 25 U/L (ref 10–35)
Albumin: 4.3 g/dL (ref 3.6–5.1)
Alkaline phosphatase (APISO): 73 U/L (ref 35–144)
BUN/Creatinine Ratio: 11 (calc) (ref 6–22)
BUN: 15 mg/dL (ref 7–25)
CO2: 28 mmol/L (ref 20–32)
Calcium: 9.7 mg/dL (ref 8.6–10.3)
Chloride: 103 mmol/L (ref 98–110)
Creat: 1.36 mg/dL — ABNORMAL HIGH (ref 0.70–1.30)
Globulin: 3.2 g/dL (calc) (ref 1.9–3.7)
Glucose, Bld: 67 mg/dL (ref 65–99)
Potassium: 4.2 mmol/L (ref 3.5–5.3)
Sodium: 140 mmol/L (ref 135–146)
Total Bilirubin: 0.4 mg/dL (ref 0.2–1.2)
Total Protein: 7.5 g/dL (ref 6.1–8.1)
eGFR: 60 mL/min/{1.73_m2} (ref >=60)

## 2022-01-05 LAB — CBC WITH DIFFERENTIAL/PLATELET
Absolute Monocytes: 825 cells/uL (ref 200–950)
Basophils Absolute: 60 cells/uL (ref 0–200)
Basophils Relative: 1.2 %
Eosinophils Absolute: 595 cells/uL — ABNORMAL HIGH (ref 15–500)
Eosinophils Relative: 11.9 %
HCT: 42.1 % (ref 38.5–50.0)
Hemoglobin: 13.6 g/dL (ref 13.2–17.1)
Lymphs Abs: 1965 cells/uL (ref 850–3900)
MCH: 25.8 pg — ABNORMAL LOW (ref 27.0–33.0)
MCHC: 32.3 g/dL (ref 32.0–36.0)
MCV: 79.7 fL — ABNORMAL LOW (ref 80.0–100.0)
MPV: 13.9 fL — ABNORMAL HIGH (ref 7.5–12.5)
Monocytes Relative: 16.5 %
Neutro Abs: 1555 cells/uL (ref 1500–7800)
Neutrophils Relative %: 31.1 %
Platelets: 157 10*3/uL (ref 140–400)
RBC: 5.28 10*6/uL (ref 4.20–5.80)
RDW: 13.6 % (ref 11.0–15.0)
Total Lymphocyte: 39.3 %
WBC: 5 10*3/uL (ref 3.8–10.8)

## 2022-01-05 MED ORDER — TRAMADOL HCL 50 MG PO TABS
ORAL_TABLET | ORAL | 0 refills | Status: DC
  Filled 2022-01-05: qty 12, 12d supply, fill #0

## 2022-01-05 NOTE — Progress Notes (Signed)
Creatinine is elevated at 1.36.  CBC is stable.  Please forward results to his PCP.

## 2022-01-26 ENCOUNTER — Other Ambulatory Visit: Payer: Self-pay | Admitting: Physician Assistant

## 2022-01-26 ENCOUNTER — Other Ambulatory Visit (HOSPITAL_COMMUNITY): Payer: Self-pay

## 2022-01-26 MED ORDER — OMEPRAZOLE 40 MG PO CPDR
40.0000 mg | DELAYED_RELEASE_CAPSULE | Freq: Every day | ORAL | 0 refills | Status: DC
  Filled 2022-01-26: qty 90, 90d supply, fill #0

## 2022-01-26 NOTE — Telephone Encounter (Signed)
Next Visit: 02/22/2022  Last Visit: 09/19/2021  Last Fill: 10/23/2021  Dx: History of gastroesophageal reflux   Current Dose per office note on 09/19/2021: not mentioned  Okay to refill omeprazole?

## 2022-01-27 ENCOUNTER — Other Ambulatory Visit (HOSPITAL_COMMUNITY): Payer: Self-pay

## 2022-01-29 ENCOUNTER — Other Ambulatory Visit (HOSPITAL_COMMUNITY): Payer: Self-pay

## 2022-01-30 ENCOUNTER — Other Ambulatory Visit (HOSPITAL_COMMUNITY): Payer: Self-pay

## 2022-02-06 DIAGNOSIS — M5136 Other intervertebral disc degeneration, lumbar region: Secondary | ICD-10-CM | POA: Diagnosis not present

## 2022-02-06 DIAGNOSIS — M5442 Lumbago with sciatica, left side: Secondary | ICD-10-CM | POA: Diagnosis not present

## 2022-02-06 DIAGNOSIS — M62830 Muscle spasm of back: Secondary | ICD-10-CM | POA: Diagnosis not present

## 2022-02-06 DIAGNOSIS — M546 Pain in thoracic spine: Secondary | ICD-10-CM | POA: Diagnosis not present

## 2022-02-08 NOTE — Progress Notes (Deleted)
Office Visit Note  Patient: Timothy Waters             Date of Birth: 1961-02-03           MRN: [id number]             PCP: Lonie Peak, PA-C Referring: Lonie Peak, PA-C Visit Date: 02/22/2022 Occupation: @GUAROCC @  Subjective:  No chief complaint on file.   History of Present Illness: Timothy Waters is a 60 y.o. male ***   Activities of Daily Living:  Patient reports morning stiffness for *** {minute/hour:19697}.   Patient {ACTIONS;DENIES/REPORTS:21021675::"Denies"} nocturnal pain.  Difficulty dressing/grooming: {ACTIONS;DENIES/REPORTS:21021675::"Denies"} Difficulty climbing stairs: {ACTIONS;DENIES/REPORTS:21021675::"Denies"} Difficulty getting out of chair: {ACTIONS;DENIES/REPORTS:21021675::"Denies"} Difficulty using hands for taps, buttons, cutlery, and/or writing: {ACTIONS;DENIES/REPORTS:21021675::"Denies"}  No Rheumatology ROS completed.   PMFS History:  Patient Active Problem List   Diagnosis Date Noted   Gastroesophageal reflux disease 01/03/2022   Allergic conjunctivitis of both eyes 01/03/2022   Seasonal and perennial allergic rhinitis 01/03/2022   Mild intermittent asthma 01/03/2022   Allergy to sulfa drugs 01/03/2022   Seasonal allergies 01/12/2020   History of gastroesophageal reflux (GERD) 01/01/2017   Rheumatoid arthritis involving multiple sites with positive rheumatoid factor (HCC) 11/08/2016   High risk medication use 11/08/2016   Vitiligo 11/08/2016    Past Medical History:  Diagnosis Date   Allergy    Arthritis    Asthma     Family History  Problem Relation Age of Onset   Diabetes Mother    Cancer Mother    COPD Father    Diabetes Father    Past Surgical History:  Procedure Laterality Date   APPENDECTOMY  11/17/2018   CARPAL TUNNEL RELEASE Left 2002   MANDIBLE SURGERY  1994   Social History   Social History Narrative   Not on file   Immunization History  Administered Date(s) Administered   Influenza,inj,Quad PF,6+ Mos  06/03/2017, 06/02/2018   Moderna Sars-Covid-2 Vaccination 10/16/2019, 11/18/2019, 05/13/2020, 03/03/2021     Objective: Vital Signs: There were no vitals taken for this visit.   Physical Exam   Musculoskeletal Exam: ***  CDAI Exam: CDAI Score: -- Patient Global: --; Provider Global: -- Swollen: --; Tender: -- Joint Exam 02/22/2022   No joint exam has been documented for this visit   There is currently no information documented on the homunculus. Go to the Rheumatology activity and complete the homunculus joint exam.  Investigation: No additional findings.  Imaging: No results found.  Recent Labs: Lab Results  Component Value Date   WBC 5.0 01/04/2022   HGB 13.6 01/04/2022   PLT 157 01/04/2022   NA 140 01/04/2022   K 4.2 01/04/2022   CL 103 01/04/2022   CO2 28 01/04/2022   GLUCOSE 67 01/04/2022   BUN 15 01/04/2022   CREATININE 1.36 (H) 01/04/2022   BILITOT 0.4 01/04/2022   AST 25 01/04/2022   ALT 18 01/04/2022   PROT 7.5 01/04/2022   CALCIUM 9.7 01/04/2022   GFRAA 68 01/17/2021   QFTBGOLDPLUS NEGATIVE 09/26/2021    Speciality Comments: Prior therapy includes: methotrexate (elevated creatinine), Arava ( rash in the past), Imuran (multiple SE's), and inadequate response to ENBREL, HUMIRA, ORENCIA, XELJANZ and SIMPONI ARIA.  Procedures:  No procedures performed Allergies: Sulfa antibiotics   Assessment / Plan:     Visit Diagnoses: Rheumatoid arthritis involving multiple sites with positive rheumatoid factor (HCC)  High risk medication use  Vitiligo  Hair loss  History of gastroesophageal reflux (GERD)  Elevated blood pressure reading  Seasonal allergies  Tendinitis of finger of right hand  Orders: No orders of the defined types were placed in this encounter.  No orders of the defined types were placed in this encounter.   Face-to-face time spent with patient was *** minutes. Greater than 50% of time was spent in counseling and coordination of  care.  Follow-Up Instructions: No follow-ups on file.   Gearldine Bienenstock, PA-C  Note - This record has been created using Dragon software.  Chart creation errors have been sought, but may not always  have been located. Such creation errors do not reflect on  the standard of medical care.

## 2022-02-13 NOTE — Progress Notes (Signed)
Office Visit Note  Patient: Timothy Waters             Date of Birth: Jan 09, 1961           MRN: [id number]             PCP: Lonie Peak, PA-C Referring: Lonie Peak, PA-C Visit Date: 02/14/2022 Occupation: @GUAROCC @  Subjective:  Medication management  History of Present Illness: Timothy Waters is a 61 y.o. male with history of seropositive rheumatoid arthritis.  He has been doing well on the combination of Simponi 50 mg subcu monthly and leflunomide 10 mg p.o. daily.  He denies any joint pain or joint swelling.  He denies any morning stiffness.  He states he was evaluated at [facility] for left shoulder joint discomfort.  He has a rotator cuff tear and he will need to surgery.  He is concerned about coming off of his medications as he had severe flare in the past.  He continues to have some hair loss.  He has been using topical agents with the dermatologist.  Activities of Daily Living:  Patient reports morning stiffness for 0 minutes.   Patient Denies nocturnal pain.  Difficulty dressing/grooming: Denies Difficulty climbing stairs: Denies Difficulty getting out of chair: Denies Difficulty using hands for taps, buttons, cutlery, and/or writing: Denies  Review of Systems  Constitutional: Negative.  Negative for fatigue and night sweats.  HENT: Negative.  Negative for mouth dryness.   Eyes: Negative.  Negative for dryness.  Respiratory:  Positive for shortness of breath.        States Allergies/ has been using inhaler more often  Cardiovascular:  Negative for chest pain and palpitations.  Gastrointestinal:  Negative for blood in stool, constipation and diarrhea.  Endocrine: Negative.  Negative for increased urination.  Genitourinary: Negative.  Negative for difficulty urinating.  Musculoskeletal: Negative.  Negative for joint pain, joint pain and morning stiffness.  Skin:  Positive for hair loss. Negative for rash and sensitivity to sunlight.  Allergic/Immunologic: Negative  for susceptible to infections.  Neurological: Negative.  Negative for dizziness and night sweats.  Hematological:  Negative for swollen glands.  Psychiatric/Behavioral: Negative.  Negative for depressed mood and sleep disturbance. The patient is not nervous/anxious.     PMFS History:  Patient Active Problem List   Diagnosis Date Noted   Gastroesophageal reflux disease 01/03/2022   Allergic conjunctivitis of both eyes 01/03/2022   Seasonal and perennial allergic rhinitis 01/03/2022   Mild intermittent asthma 01/03/2022   Allergy to sulfa drugs 01/03/2022   Seasonal allergies 01/12/2020   History of gastroesophageal reflux (GERD) 01/01/2017   Rheumatoid arthritis involving multiple sites with positive rheumatoid factor (HCC) 11/08/2016   High risk medication use 11/08/2016   Vitiligo 11/08/2016    Past Medical History:  Diagnosis Date   Allergy    Arthritis    Asthma     Family History  Problem Relation Age of Onset   Diabetes Mother    Cancer Mother    COPD Father    Diabetes Father    Past Surgical History:  Procedure Laterality Date   APPENDECTOMY  11/17/2018   CARPAL TUNNEL RELEASE Left 2002   MANDIBLE SURGERY  1994   Social History   Social History Narrative   Not on file   Immunization History  Administered Date(s) Administered   Influenza,inj,Quad PF,6+ Mos 06/03/2017, 06/02/2018   Moderna Sars-Covid-2 Vaccination 10/16/2019, 11/18/2019, 05/13/2020, 03/03/2021     Objective: Vital Signs: BP (!) 146/96   Pulse  80   Ht 5' 9.5" (1.765 m)   Wt 186 lb (84.4 kg)   BMI 27.07 kg/m    Physical Exam Vitals and nursing note reviewed.  Constitutional:      Appearance: He is well-developed.  HENT:     Head: Normocephalic and atraumatic.  Eyes:     Conjunctiva/sclera: Conjunctivae normal.     Pupils: Pupils are equal, round, and reactive to light.  Cardiovascular:     Rate and Rhythm: Normal rate and regular rhythm.     Heart sounds: Normal heart sounds.   Pulmonary:     Effort: Pulmonary effort is normal.     Breath sounds: Normal breath sounds.  Abdominal:     General: Bowel sounds are normal.     Palpations: Abdomen is soft.  Musculoskeletal:     Cervical back: Normal range of motion and neck supple.  Skin:    General: Skin is warm and dry.     Capillary Refill: Capillary refill takes less than 2 seconds.  Neurological:     Mental Status: He is alert and oriented to person, place, and time.  Psychiatric:        Behavior: Behavior normal.      Musculoskeletal Exam: C-spine thoracic and lumbar spine were in good range of motion.  Shoulder joints, elbow joints, wrist joints, MCPs PIPs and DIPs with good range of motion with no synovitis.  Hip joints, knee joints, ankles, MTPs and PIPs with good range of motion with no synovitis.  CDAI Exam: CDAI Score: 0  Patient Global: 0 mm; Provider Global: 0 mm Swollen: 0 ; Tender: 0  Joint Exam 02/14/2022   No joint exam has been documented for this visit   There is currently no information documented on the homunculus. Go to the Rheumatology activity and complete the homunculus joint exam.  Investigation: No additional findings.  Imaging: No results found.  Recent Labs: Lab Results  Component Value Date   WBC 5.0 01/04/2022   HGB 13.6 01/04/2022   PLT 157 01/04/2022   NA 140 01/04/2022   K 4.2 01/04/2022   CL 103 01/04/2022   CO2 28 01/04/2022   GLUCOSE 67 01/04/2022   BUN 15 01/04/2022   CREATININE 1.36 (H) 01/04/2022   BILITOT 0.4 01/04/2022   AST 25 01/04/2022   ALT 18 01/04/2022   PROT 7.5 01/04/2022   CALCIUM 9.7 01/04/2022   GFRAA 68 01/17/2021   QFTBGOLDPLUS NEGATIVE 09/26/2021    Speciality Comments: Prior therapy includes: methotrexate (elevated creatinine), Arava ( rash in the past), Imuran (multiple SE's), and inadequate response to ENBREL, HUMIRA, ORENCIA, XELJANZ and [place] ARIA.  Procedures:  No procedures performed Allergies: Tramadol and Sulfa  antibiotics   Assessment / Plan:     Visit Diagnoses: Rheumatoid arthritis involving multiple sites with positive rheumatoid factor (HCC)-he has been doing well on Simponi 50 mg subcu every 28 days and leflunomide 10 mg p.o. q. days.  He denies any side effects from the medications.  He denies any morning stiffness, joint pain or joint swelling.  He is concerned about having a disease flare as he would like to come off the medications for left shoulder joint surgery which will be scheduled soon.  We had a detailed discussion regarding stopping Simponi subcutaneous injections 1 month prior to the surgery and leflunomide 10 mg p.o. daily 1 week prior to the surgery.  He may resume both medications 2 weeks after if he gets clearance from the surgeon.  Patient voiced  understanding send instructions were placed in the AVS.  High risk medication use - Simponi 50 mg subcu every 28 days and leflunomide 10 mg p.o. daily.  TB gold negative on September 26, 2021.  Labs obtained on Jan 04, 2022 were reviewed.  CBC was normal.  CMP showed creatinine of 1.36 which fluctuates for him.  Increase water intake was discussed.  We will check labs in August and every 3 months to monitor for drug toxicity.  Instructions regarding immunization was placed in the AVS.  He was also advised to hold Simponi and leflunomide if he develops an infection and resume after the infection resolves.  Prescription refill for Simponi was sent today.  Elevated serum creatinine-his creatinine level fluctuates.  Left shoulder pain-he has been having pain and discomfort in his left shoulder joint.  He was diagnosed with rotator cuff tear per patient at [facility].  He will be scheduled for the surgery soon.  He was advised to hold Simponi 1 month prior to the surgery and leflunomide 1 week prior to the surgery and may resume both medications 2 weeks after the surgery if he develops no infection and he gets clearance from the surgeon.  Instruction  form was filled and sent to his surgeon.  Elevated blood pressure reading-blood pressure was elevated again today.  Has been advised to monitor blood pressure closely and follow-up with his PCP.  History of gastroesophageal reflux (GERD)  Hair loss-has noticed some thinning of hair.  Has been followed by Dr. Yetta Barre.  He has been using topical agents.  Vitiligo -vitiligo patches were noted on the extremities.  He was evaluated by Dr. Donzetta Starch.  Seasonal allergies  Orders: No orders of the defined types were placed in this encounter.  No orders of the defined types were placed in this encounter.    Follow-Up Instructions: Return in about 5 months (around 07/17/2022) for Rheumatoid arthritis.   Pollyann Savoy, MD  Note - This record has been created using Animal nutritionist.  Chart creation errors have been sought, but may not always  have been located. Such creation errors do not reflect on  the standard of medical care.

## 2022-02-14 ENCOUNTER — Other Ambulatory Visit (HOSPITAL_COMMUNITY): Payer: Self-pay

## 2022-02-14 ENCOUNTER — Ambulatory Visit: Payer: 59 | Admitting: Rheumatology

## 2022-02-14 ENCOUNTER — Encounter: Payer: Self-pay | Admitting: Rheumatology

## 2022-02-14 VITALS — BP 146/96 | HR 80 | Ht 69.5 in | Wt 186.0 lb

## 2022-02-14 DIAGNOSIS — M7711 Lateral epicondylitis, right elbow: Secondary | ICD-10-CM

## 2022-02-14 DIAGNOSIS — Z8719 Personal history of other diseases of the digestive system: Secondary | ICD-10-CM

## 2022-02-14 DIAGNOSIS — Z79899 Other long term (current) drug therapy: Secondary | ICD-10-CM | POA: Diagnosis not present

## 2022-02-14 DIAGNOSIS — M25512 Pain in left shoulder: Secondary | ICD-10-CM

## 2022-02-14 DIAGNOSIS — M778 Other enthesopathies, not elsewhere classified: Secondary | ICD-10-CM

## 2022-02-14 DIAGNOSIS — R7989 Other specified abnormal findings of blood chemistry: Secondary | ICD-10-CM | POA: Diagnosis not present

## 2022-02-14 DIAGNOSIS — M0579 Rheumatoid arthritis with rheumatoid factor of multiple sites without organ or systems involvement: Secondary | ICD-10-CM | POA: Diagnosis not present

## 2022-02-14 DIAGNOSIS — L659 Nonscarring hair loss, unspecified: Secondary | ICD-10-CM | POA: Diagnosis not present

## 2022-02-14 DIAGNOSIS — G8929 Other chronic pain: Secondary | ICD-10-CM

## 2022-02-14 DIAGNOSIS — L80 Vitiligo: Secondary | ICD-10-CM

## 2022-02-14 DIAGNOSIS — J302 Other seasonal allergic rhinitis: Secondary | ICD-10-CM

## 2022-02-14 DIAGNOSIS — R030 Elevated blood-pressure reading, without diagnosis of hypertension: Secondary | ICD-10-CM | POA: Diagnosis not present

## 2022-02-14 MED ORDER — SIMPONI 50 MG/0.5ML ~~LOC~~ SOSY
PREFILLED_SYRINGE | SUBCUTANEOUS | 0 refills | Status: DC
  Filled 2022-02-14: qty 1.5, 30d supply, fill #0
  Filled 2022-02-26: qty 0.5, 28d supply, fill #0

## 2022-02-14 NOTE — Patient Instructions (Addendum)
Standing Labs We placed an order today for your standing lab work.   Please have your standing labs drawn in August and every 3 months  If possible, please have your labs drawn 2 weeks prior to your appointment so that the provider can discuss your results at your appointment.  Please note that you may see your imaging and lab results in MyChart before we have reviewed them. We may be awaiting multiple results to interpret others before contacting you. Please allow our office up to 72 hours to thoroughly review all of the results before contacting the office for clarification of your results.  We have open lab daily: Monday through Thursday from 1:30-4:30 PM and Friday from 1:30-4:00 PM at the office of Dr. Pollyann Savoy, [facility].   Please be advised, all patients with office appointments requiring lab work will take precedent over walk-in lab work.  If possible, please come for your lab work on Monday and Friday afternoons, as you may experience shorter wait times. The office is located at [address], [place], [place] [address] No appointment is necessary.   Labs are drawn by [facility]. Please bring your co-pay at the time of your lab draw.  You may receive a bill from [facility] for your lab work.  Please note if you are on Hydroxychloroquine and and an order has been placed for a Hydroxychloroquine level, you will need to have it drawn 4 hours or more after your last dose.  If you wish to have your labs drawn at another location, please call the office 24 hours in advance to send orders.  If you have any questions regarding directions or hours of operation,  please call [phone].   As a reminder, please drink plenty of water prior to coming for your lab work. Thanks!   Vaccines You are taking a medication(s) that can suppress your immune system.  The following immunizations are recommended: Flu annually Covid-19  Td/Tdap (tetanus, diphtheria,  pertussis) every 10 years Pneumonia (Prevnar 15 then Pneumovax 23 at least 1 year apart.  Alternatively, can take Prevnar 20 without needing additional dose) Shingrix: 2 doses from 4 weeks to 6 months apart  Please check with your PCP to make sure you are up to date.   If you have signs or symptoms of an infection or start antibiotics: First, call your PCP for workup of your infection. Hold your medication through the infection, until you complete your antibiotics, and until symptoms resolve if you take the following: Injectable medication (Actemra, Benlysta, Cimzia, Cosentyx, Enbrel, Humira, Kevzara, Orencia, Remicade, Simponi, Stelara, Taltz, Tremfya) Methotrexate Leflunomide (Arava) Mycophenolate (Cellcept) Xeljanz, Olumiant, or Rinvoq   IF YOU HAVE A SURGERY SCHEDULED: Please stop Simponi subcutaneous injections 1 month prior to the surgery and leflunomide 1 week prior to the surgery.  You may resume both Symphony subcutaneous injections and leflunomide 2 weeks after the surgery if there is no infection and you get clearance by your surgeon.

## 2022-02-22 ENCOUNTER — Ambulatory Visit: Payer: 59 | Admitting: Rheumatology

## 2022-02-22 DIAGNOSIS — L659 Nonscarring hair loss, unspecified: Secondary | ICD-10-CM

## 2022-02-22 DIAGNOSIS — M778 Other enthesopathies, not elsewhere classified: Secondary | ICD-10-CM

## 2022-02-22 DIAGNOSIS — J302 Other seasonal allergic rhinitis: Secondary | ICD-10-CM

## 2022-02-22 DIAGNOSIS — Z8719 Personal history of other diseases of the digestive system: Secondary | ICD-10-CM

## 2022-02-22 DIAGNOSIS — L80 Vitiligo: Secondary | ICD-10-CM

## 2022-02-22 DIAGNOSIS — M0579 Rheumatoid arthritis with rheumatoid factor of multiple sites without organ or systems involvement: Secondary | ICD-10-CM

## 2022-02-22 DIAGNOSIS — R030 Elevated blood-pressure reading, without diagnosis of hypertension: Secondary | ICD-10-CM

## 2022-02-22 DIAGNOSIS — Z79899 Other long term (current) drug therapy: Secondary | ICD-10-CM

## 2022-02-23 ENCOUNTER — Other Ambulatory Visit (HOSPITAL_COMMUNITY): Payer: Self-pay

## 2022-02-26 ENCOUNTER — Other Ambulatory Visit (HOSPITAL_COMMUNITY): Payer: Self-pay

## 2022-02-27 ENCOUNTER — Other Ambulatory Visit (HOSPITAL_COMMUNITY): Payer: Self-pay

## 2022-03-05 ENCOUNTER — Telehealth: Payer: Self-pay | Admitting: Rheumatology

## 2022-03-05 NOTE — Telephone Encounter (Signed)
Patient called stating he is scheduled for left shoulder surgery on 03/12/22.  Patient states he was told to discontinue Leflunomide today which is 1 week prior to surgery.  Patient states the recovery time is approximately 8-12 weeks and doesn't know what he can take if he has flair during that time.  Patient advised he will be able to resume his medication about 2 weeks after surgery as long as he is cleared by the surgeon. Patient advised that if he flares to contact us and we will be able to address how to handle it at that time. Patient expressed understanding.

## 2022-03-05 NOTE — Telephone Encounter (Signed)
Patient called stating he is scheduled for left shoulder surgery on 03/12/22.  Patient states he was told to discontinue Leflunomide today which is 1 week prior to surgery.  Patient states the recovery time is approximately 8-12 weeks and doesn't know what he can take if he has flair during that time.  Patient requested a return call.

## 2022-03-06 DIAGNOSIS — M5442 Lumbago with sciatica, left side: Secondary | ICD-10-CM | POA: Diagnosis not present

## 2022-03-06 DIAGNOSIS — M62830 Muscle spasm of back: Secondary | ICD-10-CM | POA: Diagnosis not present

## 2022-03-06 DIAGNOSIS — M5136 Other intervertebral disc degeneration, lumbar region: Secondary | ICD-10-CM | POA: Diagnosis not present

## 2022-03-06 DIAGNOSIS — M546 Pain in thoracic spine: Secondary | ICD-10-CM | POA: Diagnosis not present

## 2022-03-07 ENCOUNTER — Other Ambulatory Visit (HOSPITAL_COMMUNITY): Payer: Self-pay

## 2022-03-08 ENCOUNTER — Other Ambulatory Visit (HOSPITAL_COMMUNITY): Payer: Self-pay

## 2022-03-09 ENCOUNTER — Other Ambulatory Visit (HOSPITAL_COMMUNITY): Payer: Self-pay

## 2022-03-12 ENCOUNTER — Other Ambulatory Visit: Payer: Self-pay | Admitting: Pharmacist

## 2022-03-12 ENCOUNTER — Other Ambulatory Visit (HOSPITAL_COMMUNITY): Payer: Self-pay

## 2022-03-12 HISTORY — PX: SHOULDER ARTHROSCOPY WITH BICEPS TENDON REPAIR: SHX5674

## 2022-03-12 MED ORDER — SIMPONI 50 MG/0.5ML ~~LOC~~ SOSY
PREFILLED_SYRINGE | SUBCUTANEOUS | 0 refills | Status: DC
  Filled 2022-03-12: qty 0.5, 28d supply, fill #0

## 2022-03-15 ENCOUNTER — Telehealth: Payer: Self-pay | Admitting: *Deleted

## 2022-03-15 DIAGNOSIS — Z79899 Other long term (current) drug therapy: Secondary | ICD-10-CM

## 2022-03-15 DIAGNOSIS — M0579 Rheumatoid arthritis with rheumatoid factor of multiple sites without organ or systems involvement: Secondary | ICD-10-CM

## 2022-03-15 NOTE — Telephone Encounter (Signed)
We can order rheumatoid factor, anti-CCP and sedimentation rate.  Although I do not recommend to stop his medications as he had severe flare in the past 2 months after stopping medications.  We may consider tapering medications in the future if he does well.

## 2022-03-15 NOTE — Telephone Encounter (Signed)
Patient contacted the office and states he had surgery on 03/12/2022. Patient states he had his last Simponi about 5 weeks ago and has been off Arava for about 1 week. Patient states he is having no pain in his joints. Patient states in the last 15 years this is the first time he has had not trouble when having to hold medication. Patient would like to know if we can check a rheumatoid factor. He wants to know what would be the options as far as his medications go if he is in remission. Please advise.

## 2022-03-15 NOTE — Addendum Note (Signed)
Addended by: Henriette Combs on: 03/15/2022 01:25 PM   Modules accepted: Orders

## 2022-03-15 NOTE — Telephone Encounter (Signed)
Patient advised we can order rheumatoid factor, anti-CCP and sedimentation rate.  Although I do not recommend to stop his medications as he had severe flare in the past 2 months after stopping medications.  We may consider tapering medications in the future if he does well. Patient expressed understanding and lab orders placed. Patient states he will have them drawn with his standing labs in August.

## 2022-03-19 ENCOUNTER — Other Ambulatory Visit (HOSPITAL_COMMUNITY): Payer: Self-pay

## 2022-03-19 ENCOUNTER — Other Ambulatory Visit: Payer: Self-pay | Admitting: Pharmacist

## 2022-03-19 DIAGNOSIS — M0579 Rheumatoid arthritis with rheumatoid factor of multiple sites without organ or systems involvement: Secondary | ICD-10-CM

## 2022-03-19 MED ORDER — SIMPONI 50 MG/0.5ML ~~LOC~~ SOSY
PREFILLED_SYRINGE | SUBCUTANEOUS | 0 refills | Status: DC
  Filled 2022-03-19: qty 1.5, fill #0
  Filled 2022-03-28: qty 1.5, 28d supply, fill #0
  Filled 2022-05-01: qty 0.5, 28d supply, fill #0
  Filled 2022-05-18: qty 0.5, 28d supply, fill #1
  Filled 2022-06-15: qty 0.5, 28d supply, fill #2

## 2022-03-19 NOTE — Telephone Encounter (Addendum)
Rx for Simponi 50mg  SQ every 30 days sent to [facility] for hospital-based cost pricing.   Total qty remaining is 1.59ml    4m, PharmD, MPH, BCPS, CPP Clinical Pharmacist (Rheumatology and Pulmonology)

## 2022-03-26 ENCOUNTER — Other Ambulatory Visit (HOSPITAL_COMMUNITY): Payer: Self-pay

## 2022-03-26 ENCOUNTER — Telehealth: Payer: Self-pay | Admitting: *Deleted

## 2022-03-26 NOTE — Telephone Encounter (Signed)
Patient contacted the office stating he had surgery on 03/12/2022. Patient was inquiring on when to restart his medications. Patient advised he will need to be cleared by the surgeon in order to restart. Patient expressed understanding and states he sees his surgeon tomorrow.

## 2022-03-28 ENCOUNTER — Other Ambulatory Visit (HOSPITAL_COMMUNITY): Payer: Self-pay

## 2022-04-02 ENCOUNTER — Other Ambulatory Visit: Payer: Self-pay | Admitting: Physician Assistant

## 2022-04-02 ENCOUNTER — Telehealth: Payer: Self-pay | Admitting: *Deleted

## 2022-04-02 ENCOUNTER — Other Ambulatory Visit (HOSPITAL_COMMUNITY): Payer: Self-pay

## 2022-04-02 NOTE — Telephone Encounter (Signed)
Please clarify if the rash resolved after discontinuing arava?   Ok to continue on simponi as prescribed. He can continue on simponi as monotherapy for now and if he develops increased joint pain or joint swelling he should schedule a sooner visit to discuss other treatment options.

## 2022-04-02 NOTE — Telephone Encounter (Signed)
Patient contacted the office stating that he had surgery on 03/12/2022. Patient states he restarted the Arava last week. Patient states after restarting the [place] he broke out with whelps on his back and having itching. Patient states he has discontinued taking the [place]. Patient states he has also restarted on his Simponi. Patient would like to know what the recommendations would be. Patient will be coming to the office this week to update labs.

## 2022-04-02 NOTE — Telephone Encounter (Signed)
Patient advised the rash did resolve after discontinuing Arava.    Patient advised ok to continue on simponi as prescribed. He can continue on simponi as monotherapy for now and if he develops increased joint pain or joint swelling he should schedule a sooner visit to discuss other treatment options.   Patient also asked about Folic Acid prescription. Patient advised  as he is not on MTX he does not need to take the Folic Acid. Patient expressed understanding.

## 2022-04-04 DIAGNOSIS — M546 Pain in thoracic spine: Secondary | ICD-10-CM | POA: Diagnosis not present

## 2022-04-04 DIAGNOSIS — M5136 Other intervertebral disc degeneration, lumbar region: Secondary | ICD-10-CM | POA: Diagnosis not present

## 2022-04-04 DIAGNOSIS — M5442 Lumbago with sciatica, left side: Secondary | ICD-10-CM | POA: Diagnosis not present

## 2022-04-04 DIAGNOSIS — M62830 Muscle spasm of back: Secondary | ICD-10-CM | POA: Diagnosis not present

## 2022-04-06 ENCOUNTER — Other Ambulatory Visit: Payer: Self-pay | Admitting: Rheumatology

## 2022-04-06 ENCOUNTER — Other Ambulatory Visit: Payer: Self-pay | Admitting: *Deleted

## 2022-04-06 ENCOUNTER — Other Ambulatory Visit (HOSPITAL_COMMUNITY): Payer: Self-pay

## 2022-04-06 ENCOUNTER — Other Ambulatory Visit: Payer: Self-pay | Admitting: Physician Assistant

## 2022-04-06 DIAGNOSIS — Z79899 Other long term (current) drug therapy: Secondary | ICD-10-CM | POA: Diagnosis not present

## 2022-04-06 DIAGNOSIS — M0579 Rheumatoid arthritis with rheumatoid factor of multiple sites without organ or systems involvement: Secondary | ICD-10-CM | POA: Diagnosis not present

## 2022-04-06 MED ORDER — FOLIC ACID 1 MG PO TABS
2.0000 mg | ORAL_TABLET | Freq: Every day | ORAL | 3 refills | Status: DC
  Filled 2022-04-06: qty 180, 90d supply, fill #0
  Filled 2022-07-03: qty 180, 90d supply, fill #1
  Filled 2022-09-26: qty 180, 90d supply, fill #2
  Filled 2023-01-06: qty 180, 90d supply, fill #3

## 2022-04-06 NOTE — Telephone Encounter (Signed)
Next Visit: 08/02/2022  Last Visit: 02/14/2022  Last Fill: 04/11/2022  Dx: Rheumatoid arthritis involving multiple sites with positive rheumatoid factor   Current Dose per office note on 02/14/2022: not mentioned (Patient not on MTX)  Okay to refill Folic Acid?

## 2022-04-06 NOTE — Telephone Encounter (Signed)
Patient states he spoke with Timothy Waters and has decided he does want to continue taking Folic Acid.  Patient requested the prescription be sent to [facility].

## 2022-04-11 ENCOUNTER — Other Ambulatory Visit (HOSPITAL_COMMUNITY): Payer: Self-pay

## 2022-04-11 LAB — COMPLETE METABOLIC PANEL WITH GFR
AG Ratio: 1.4 (calc) (ref 1.0–2.5)
ALT: 24 U/L (ref 9–46)
AST: 25 U/L (ref 10–35)
Albumin: 4.6 g/dL (ref 3.6–5.1)
Alkaline phosphatase (APISO): 86 U/L (ref 35–144)
BUN/Creatinine Ratio: 11 (calc) (ref 6–22)
BUN: 15 mg/dL (ref 7–25)
CO2: 25 mmol/L (ref 20–32)
Calcium: 9.8 mg/dL (ref 8.6–10.3)
Chloride: 104 mmol/L (ref 98–110)
Creat: 1.4 mg/dL — ABNORMAL HIGH (ref 0.70–1.35)
Globulin: 3.2 g/dL (calc) (ref 1.9–3.7)
Glucose, Bld: 86 mg/dL (ref 65–99)
Potassium: 4.2 mmol/L (ref 3.5–5.3)
Sodium: 141 mmol/L (ref 135–146)
Total Bilirubin: 0.5 mg/dL (ref 0.2–1.2)
Total Protein: 7.8 g/dL (ref 6.1–8.1)
eGFR: 58 mL/min/{1.73_m2} — ABNORMAL LOW (ref >=60)

## 2022-04-11 LAB — CBC WITH DIFFERENTIAL/PLATELET
Absolute Monocytes: 708 cells/uL (ref 200–950)
Basophils Absolute: 48 cells/uL (ref 0–200)
Basophils Relative: 1.1 %
Eosinophils Absolute: 378 cells/uL (ref 15–500)
Eosinophils Relative: 8.6 %
HCT: 45.4 % (ref 38.5–50.0)
Hemoglobin: 14.4 g/dL (ref 13.2–17.1)
Lymphs Abs: 1553 cells/uL (ref 850–3900)
MCH: 25.3 pg — ABNORMAL LOW (ref 27.0–33.0)
MCHC: 31.7 g/dL — ABNORMAL LOW (ref 32.0–36.0)
MCV: 79.8 fL — ABNORMAL LOW (ref 80.0–100.0)
MPV: 13.3 fL — ABNORMAL HIGH (ref 7.5–12.5)
Monocytes Relative: 16.1 %
Neutro Abs: 1712 cells/uL (ref 1500–7800)
Neutrophils Relative %: 38.9 %
Platelets: 179 10*3/uL (ref 140–400)
RBC: 5.69 10*6/uL (ref 4.20–5.80)
RDW: 13.4 % (ref 11.0–15.0)
Total Lymphocyte: 35.3 %
WBC: 4.4 10*3/uL (ref 3.8–10.8)

## 2022-04-11 LAB — RHEUMATOID FACTOR: Rheumatoid fact SerPl-aCnc: 85 IU/mL — ABNORMAL HIGH (ref <14)

## 2022-04-11 LAB — CYCLIC CITRUL PEPTIDE ANTIBODY, IGG: Cyclic Citrullin Peptide Ab: >250 UNITS — ABNORMAL HIGH

## 2022-04-11 LAB — SEDIMENTATION RATE: Sed Rate: 6 mm/h (ref 0–20)

## 2022-04-11 MED ORDER — FLUTICASONE PROPIONATE HFA 220 MCG/ACT IN AERO
INHALATION_SPRAY | RESPIRATORY_TRACT | 5 refills | Status: DC
  Filled 2022-04-11: qty 12, 25d supply, fill #0
  Filled 2022-05-20: qty 12, 25d supply, fill #1
  Filled 2022-06-23: qty 12, 25d supply, fill #2
  Filled 2022-07-30: qty 12, 25d supply, fill #3
  Filled 2022-08-31: qty 12, 25d supply, fill #4
  Filled 2022-09-23: qty 12, 25d supply, fill #5
  Filled 2022-09-27: qty 12, 30d supply, fill #5

## 2022-04-11 NOTE — Progress Notes (Signed)
Rheumatoid factor and anti-CCP are still positive consistent with rheumatoid arthritis.  Creatinine is elevated.  CBC is stable.  Sed rate is normal.  Labs do not indicate a rheumatoid arthritis flare.  Please ask patient that we recommend referral to nephrology for chronic elevation of creatinine.

## 2022-04-12 ENCOUNTER — Telehealth: Payer: Self-pay | Admitting: *Deleted

## 2022-04-12 ENCOUNTER — Other Ambulatory Visit (HOSPITAL_COMMUNITY): Payer: Self-pay

## 2022-04-12 DIAGNOSIS — R7989 Other specified abnormal findings of blood chemistry: Secondary | ICD-10-CM

## 2022-04-12 DIAGNOSIS — Z79899 Other long term (current) drug therapy: Secondary | ICD-10-CM

## 2022-04-12 NOTE — Addendum Note (Signed)
Addended by: Henriette Combs on: 04/12/2022 12:27 PM   Modules accepted: Orders

## 2022-04-12 NOTE — Telephone Encounter (Signed)
Per lab note 04/06/2022  Creatinine is elevated.  CBC is stable.  Sed rate is normal.  Labs do not indicate a rheumatoid arthritis flare.  Please ask patient that we recommend referral to nephrology for chronic elevation of creatinine.  Patient states he would like to come to the office the week of April 23, 2022 to have have kidney function rechecked prior to getting referral for nephrology.   Okay for patient to come for BMP prior to referral?

## 2022-04-12 NOTE — Telephone Encounter (Signed)
Future order placed 

## 2022-04-16 ENCOUNTER — Other Ambulatory Visit (HOSPITAL_COMMUNITY): Payer: Self-pay

## 2022-04-18 ENCOUNTER — Telehealth: Payer: Self-pay | Admitting: Pharmacy Technician

## 2022-04-18 ENCOUNTER — Other Ambulatory Visit (HOSPITAL_COMMUNITY): Payer: Self-pay

## 2022-04-18 NOTE — Telephone Encounter (Signed)
Patient Advocate Encounter  Received notification that prior authorization for [facility] 50MG  is required.   PA submitted on 8.30.23 Key [id number] Status is pending    12-06-1988, CPhT Patient Advocate Phone: [phone]

## 2022-04-19 ENCOUNTER — Other Ambulatory Visit (HOSPITAL_COMMUNITY): Payer: Self-pay

## 2022-04-20 ENCOUNTER — Other Ambulatory Visit (HOSPITAL_COMMUNITY): Payer: Self-pay

## 2022-04-24 ENCOUNTER — Other Ambulatory Visit (HOSPITAL_COMMUNITY): Payer: Self-pay

## 2022-04-24 ENCOUNTER — Other Ambulatory Visit: Payer: Self-pay | Admitting: Rheumatology

## 2022-04-24 MED ORDER — OMEPRAZOLE 40 MG PO CPDR
40.0000 mg | DELAYED_RELEASE_CAPSULE | Freq: Every day | ORAL | 0 refills | Status: DC
Start: 2022-04-24 — End: 2022-07-25
  Filled 2022-04-24: qty 90, 90d supply, fill #0

## 2022-04-24 NOTE — Telephone Encounter (Signed)
Received fax from [employer] requesting clarification on Simponi dosing. Completed questions and faxed form to [employer]  Fax: [phone] Phone: [phone] Case ID # [id number]  Chesley Mires, PharmD, MPH, BCPS, CPP Clinical Pharmacist (Rheumatology and Pulmonology)

## 2022-04-24 NOTE — Telephone Encounter (Signed)
Next Visit: 08/02/2022   Last Visit: 02/14/2022   Last Fill: 01/26/2022  Dx: History of gastroesophageal reflux   Current Dose per office note on 02/14/2022: not discussed  Okay to refill Omeprazole?

## 2022-04-25 ENCOUNTER — Other Ambulatory Visit (HOSPITAL_COMMUNITY): Payer: Self-pay

## 2022-04-26 NOTE — Telephone Encounter (Signed)
Received notification from [facility] regarding a prior authorization for [facility]. Authorization has been APPROVED from 04/25/22 to 04/25/23.   Patient must continue to fill through [facility]: [phone]   Authorization # [id number]  [employer] updated.  Chesley Mires, PharmD, MPH, BCPS, CPP Clinical Pharmacist (Rheumatology and Pulmonology)

## 2022-04-27 ENCOUNTER — Other Ambulatory Visit (HOSPITAL_COMMUNITY): Payer: Self-pay

## 2022-04-30 ENCOUNTER — Other Ambulatory Visit (HOSPITAL_COMMUNITY): Payer: Self-pay

## 2022-05-01 ENCOUNTER — Other Ambulatory Visit (HOSPITAL_COMMUNITY): Payer: Self-pay

## 2022-05-07 DIAGNOSIS — M546 Pain in thoracic spine: Secondary | ICD-10-CM | POA: Diagnosis not present

## 2022-05-07 DIAGNOSIS — M5136 Other intervertebral disc degeneration, lumbar region: Secondary | ICD-10-CM | POA: Diagnosis not present

## 2022-05-07 DIAGNOSIS — M5442 Lumbago with sciatica, left side: Secondary | ICD-10-CM | POA: Diagnosis not present

## 2022-05-07 DIAGNOSIS — M62830 Muscle spasm of back: Secondary | ICD-10-CM | POA: Diagnosis not present

## 2022-05-08 ENCOUNTER — Other Ambulatory Visit (HOSPITAL_COMMUNITY): Payer: Self-pay

## 2022-05-18 ENCOUNTER — Other Ambulatory Visit (HOSPITAL_COMMUNITY): Payer: Self-pay

## 2022-05-21 ENCOUNTER — Other Ambulatory Visit (HOSPITAL_COMMUNITY): Payer: Self-pay

## 2022-05-22 ENCOUNTER — Other Ambulatory Visit (HOSPITAL_COMMUNITY): Payer: Self-pay

## 2022-06-07 DIAGNOSIS — M546 Pain in thoracic spine: Secondary | ICD-10-CM | POA: Diagnosis not present

## 2022-06-07 DIAGNOSIS — M5136 Other intervertebral disc degeneration, lumbar region: Secondary | ICD-10-CM | POA: Diagnosis not present

## 2022-06-07 DIAGNOSIS — M5442 Lumbago with sciatica, left side: Secondary | ICD-10-CM | POA: Diagnosis not present

## 2022-06-07 DIAGNOSIS — M62830 Muscle spasm of back: Secondary | ICD-10-CM | POA: Diagnosis not present

## 2022-06-12 ENCOUNTER — Telehealth: Payer: Self-pay | Admitting: *Deleted

## 2022-06-12 NOTE — Telephone Encounter (Signed)
He should see his orthopedic surgeon as his ankle is bruised.

## 2022-06-12 NOTE — Telephone Encounter (Signed)
Patient contacted the office and left message for a return call. Patient wants to know his next appointment date and states he is having ankle pain.   Returned call to the patient. Advised patient his next appointment in 08/02/2022. Patient states he had shoulder surgery in July 2023. Patient states 2 weeks after surgery he restarted the Simponi but the decision was made to stay on Simponi as a monotherapy. So he did not restart Arava. Patient states he restarted the [place] about a week ago because he is having trouble in his right ankle. Patient states his ankle is tender and black and blue on the inside. He states it started about one week ago. He states it throbs like a toothache. Patient states he does not recall any injury. 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 Patient states he has used Voltaren Gel with no relief. Please advise.

## 2022-06-12 NOTE — Telephone Encounter (Signed)
Patient advised he should see his orthopedic surgeon as his ankle is bruised. Patient will contact his orthopedic surgeon and schedule an appointment.

## 2022-06-15 ENCOUNTER — Other Ambulatory Visit (HOSPITAL_COMMUNITY): Payer: Self-pay

## 2022-06-21 ENCOUNTER — Other Ambulatory Visit (HOSPITAL_COMMUNITY): Payer: Self-pay

## 2022-06-25 ENCOUNTER — Other Ambulatory Visit (HOSPITAL_COMMUNITY): Payer: Self-pay

## 2022-06-25 ENCOUNTER — Encounter (HOSPITAL_COMMUNITY): Payer: Self-pay

## 2022-06-26 ENCOUNTER — Other Ambulatory Visit (HOSPITAL_COMMUNITY): Payer: Self-pay

## 2022-07-02 DIAGNOSIS — Z23 Encounter for immunization: Secondary | ICD-10-CM | POA: Diagnosis not present

## 2022-07-02 DIAGNOSIS — J45991 Cough variant asthma: Secondary | ICD-10-CM | POA: Diagnosis not present

## 2022-07-02 DIAGNOSIS — Z79899 Other long term (current) drug therapy: Secondary | ICD-10-CM | POA: Diagnosis not present

## 2022-07-02 DIAGNOSIS — K219 Gastro-esophageal reflux disease without esophagitis: Secondary | ICD-10-CM | POA: Diagnosis not present

## 2022-07-02 DIAGNOSIS — Z0000 Encounter for general adult medical examination without abnormal findings: Secondary | ICD-10-CM | POA: Diagnosis not present

## 2022-07-02 DIAGNOSIS — M069 Rheumatoid arthritis, unspecified: Secondary | ICD-10-CM | POA: Diagnosis not present

## 2022-07-02 DIAGNOSIS — Z1331 Encounter for screening for depression: Secondary | ICD-10-CM | POA: Diagnosis not present

## 2022-07-03 ENCOUNTER — Other Ambulatory Visit: Payer: Self-pay | Admitting: Rheumatology

## 2022-07-04 ENCOUNTER — Encounter: Payer: Self-pay | Admitting: *Deleted

## 2022-07-04 ENCOUNTER — Other Ambulatory Visit (HOSPITAL_COMMUNITY): Payer: Self-pay

## 2022-07-04 MED ORDER — LEFLUNOMIDE 10 MG PO TABS
ORAL_TABLET | Freq: Every day | ORAL | 0 refills | Status: DC
  Filled 2022-07-04: qty 90, 90d supply, fill #0

## 2022-07-04 NOTE — Telephone Encounter (Signed)
Next Visit: 08/02/2022   Last Visit: 02/14/2022   Last Fill: 01/01/2022   YN:[id number] arthritis involving multiple sites with positive rheumatoid factor    Current Dose per office note on 02/14/2022: leflunomide 10 mg p.o. daily.   Labs: 04/06/2022 Creatinine is elevated.  CBC is stable.   Sent message via my chart to advise patient he is due to update labs work.   Okay to refill Arava?

## 2022-07-05 ENCOUNTER — Other Ambulatory Visit (HOSPITAL_COMMUNITY): Payer: Self-pay

## 2022-07-05 ENCOUNTER — Other Ambulatory Visit: Payer: Self-pay | Admitting: *Deleted

## 2022-07-05 DIAGNOSIS — Z79899 Other long term (current) drug therapy: Secondary | ICD-10-CM

## 2022-07-06 LAB — COMPLETE METABOLIC PANEL WITH GFR
AG Ratio: 1.3 (calc) (ref 1.0–2.5)
ALT: 22 U/L (ref 9–46)
AST: 26 U/L (ref 10–35)
Albumin: 4.4 g/dL (ref 3.6–5.1)
Alkaline phosphatase (APISO): 80 U/L (ref 35–144)
BUN/Creatinine Ratio: 12 (calc) (ref 6–22)
BUN: 17 mg/dL (ref 7–25)
CO2: 31 mmol/L (ref 20–32)
Calcium: 9.7 mg/dL (ref 8.6–10.3)
Chloride: 101 mmol/L (ref 98–110)
Creat: 1.37 mg/dL — ABNORMAL HIGH (ref 0.70–1.35)
Globulin: 3.4 g/dL (calc) (ref 1.9–3.7)
Glucose, Bld: 97 mg/dL (ref 65–99)
Potassium: 4.3 mmol/L (ref 3.5–5.3)
Sodium: 139 mmol/L (ref 135–146)
Total Bilirubin: 0.4 mg/dL (ref 0.2–1.2)
Total Protein: 7.8 g/dL (ref 6.1–8.1)
eGFR: 59 mL/min/{1.73_m2} — ABNORMAL LOW (ref >=60)

## 2022-07-06 LAB — CBC WITH DIFFERENTIAL/PLATELET
Absolute Monocytes: 546 cells/uL (ref 200–950)
Basophils Absolute: 48 cells/uL (ref 0–200)
Basophils Relative: 1.1 %
Eosinophils Absolute: 444 cells/uL (ref 15–500)
Eosinophils Relative: 10.1 %
HCT: 41 % (ref 38.5–50.0)
Hemoglobin: 13.3 g/dL (ref 13.2–17.1)
Lymphs Abs: 1606 cells/uL (ref 850–3900)
MCH: 25.9 pg — ABNORMAL LOW (ref 27.0–33.0)
MCHC: 32.4 g/dL (ref 32.0–36.0)
MCV: 79.9 fL — ABNORMAL LOW (ref 80.0–100.0)
MPV: 13.5 fL — ABNORMAL HIGH (ref 7.5–12.5)
Monocytes Relative: 12.4 %
Neutro Abs: 1756 cells/uL (ref 1500–7800)
Neutrophils Relative %: 39.9 %
Platelets: 187 10*3/uL (ref 140–400)
RBC: 5.13 10*6/uL (ref 4.20–5.80)
RDW: 13.3 % (ref 11.0–15.0)
Total Lymphocyte: 36.5 %
WBC: 4.4 10*3/uL (ref 3.8–10.8)

## 2022-07-06 NOTE — Progress Notes (Signed)
CBC and CMP are stable.  Creatinine is elevated at 1.37.

## 2022-07-16 ENCOUNTER — Other Ambulatory Visit (HOSPITAL_COMMUNITY): Payer: Self-pay

## 2022-07-18 ENCOUNTER — Other Ambulatory Visit (HOSPITAL_COMMUNITY): Payer: Self-pay

## 2022-07-18 ENCOUNTER — Other Ambulatory Visit: Payer: Self-pay | Admitting: Physician Assistant

## 2022-07-18 DIAGNOSIS — M0579 Rheumatoid arthritis with rheumatoid factor of multiple sites without organ or systems involvement: Secondary | ICD-10-CM

## 2022-07-18 MED ORDER — SIMPONI 50 MG/0.5ML ~~LOC~~ SOSY
PREFILLED_SYRINGE | SUBCUTANEOUS | 0 refills | Status: DC
  Filled 2022-07-18: qty 0.5, 28d supply, fill #0
  Filled 2022-08-10: qty 0.5, 28d supply, fill #1
  Filled 2022-09-07: qty 0.5, 28d supply, fill #2

## 2022-07-18 NOTE — Telephone Encounter (Signed)
Next Visit: 08/02/2022   Last Visit: 02/14/2022   Last Fill: 03/19/2022  MK:[id number] arthritis involving multiple sites with positive rheumatoid factor     Current Dose per office note on 02/14/2022: Simponi 50mg  SQ every 30 days    Labs: 07/05/2022 CBC and CMP are stable.  Creatinine is elevated at 1.37.   TB Gold: 09/26/2021 Neg   Okay to refill Simponi?

## 2022-07-19 ENCOUNTER — Other Ambulatory Visit (HOSPITAL_COMMUNITY): Payer: Self-pay

## 2022-07-20 NOTE — Progress Notes (Signed)
Office Visit Note  Patient: Timothy Waters             Date of Birth: May 25, 1961           MRN: [id number]             PCP: Lonie Peak, PA-C Referring: Lonie Peak, PA-C Visit Date: 08/02/2022 Occupation: @GUAROCC @  Subjective:  Medication management  History of Present Illness: Timothy Waters is a 61 y.o. male seropositive rheumatoid arthritis.  He had left biceps tendon repair in July 2023.  He states he stopped leflunomide and Simponi for approximately 6 to 8 weeks.  He had a mild flare of right ankle joint swelling in October.  He had no flares since then and all the joints are doing well currently without any discomfort or swelling.  Activities of Daily Living:  Patient reports morning stiffness for 5 minutes.   Patient Denies nocturnal pain.  Difficulty dressing/grooming: Denies Difficulty climbing stairs: Denies Difficulty getting out of chair: Denies Difficulty using hands for taps, buttons, cutlery, and/or writing: Denies  Review of Systems  Constitutional:  Negative for fatigue.  HENT:  Negative for mouth sores and mouth dryness.   Eyes:  Negative for dryness.  Respiratory:  Negative for shortness of breath.   Cardiovascular:  Negative for chest pain and palpitations.  Gastrointestinal:  Negative for blood in stool, constipation and diarrhea.  Endocrine: Negative for increased urination.  Genitourinary:  Negative for involuntary urination.  Musculoskeletal:  Positive for joint pain, joint pain and morning stiffness. Negative for gait problem, joint swelling, myalgias, muscle weakness, muscle tenderness and myalgias.  Skin:  Positive for hair loss. Negative for color change, rash and sensitivity to sunlight.  Allergic/Immunologic: Negative for susceptible to infections.  Neurological:  Negative for dizziness and headaches.  Hematological:  Negative for swollen glands.  Psychiatric/Behavioral:  Negative for depressed mood and sleep disturbance. The patient is not  nervous/anxious.     PMFS History:  Patient Active Problem List   Diagnosis Date Noted   Gastroesophageal reflux disease 01/03/2022   Allergic conjunctivitis of both eyes 01/03/2022   Seasonal and perennial allergic rhinitis 01/03/2022   Mild intermittent asthma 01/03/2022   Allergy to sulfa drugs 01/03/2022   Seasonal allergies 01/12/2020   History of gastroesophageal reflux (GERD) 01/01/2017   Rheumatoid arthritis involving multiple sites with positive rheumatoid factor (HCC) 11/08/2016   High risk medication use 11/08/2016   Vitiligo 11/08/2016    Past Medical History:  Diagnosis Date   Allergy    Arthritis    Asthma     Family History  Problem Relation Age of Onset   Diabetes Mother    Cancer Mother    COPD Father    Diabetes Father    Past Surgical History:  Procedure Laterality Date   APPENDECTOMY  11/17/2018   CARPAL TUNNEL RELEASE Left 2002   MANDIBLE SURGERY  1994   SHOULDER ARTHROSCOPY WITH BICEPS TENDON REPAIR Left    Social History   Social History Narrative   Not on file   Immunization History  Administered Date(s) Administered   Influenza,inj,Quad PF,6+ Mos 06/03/2017, 06/02/2018   Moderna Sars-Covid-2 Vaccination 10/16/2019, 11/18/2019, 05/13/2020, 03/03/2021     Objective: Vital Signs: BP (!) 128/90 (BP Location: Left Arm, Patient Position: Sitting, Cuff Size: Normal)   Pulse 86   Resp 12   Ht 5\' 11"  (1.803 m)   Wt 181 lb 12.8 oz (82.5 kg)   BMI 25.36 kg/m    Physical Exam Vitals and  nursing note reviewed.  Constitutional:      Appearance: He is well-developed.  HENT:     Head: Normocephalic and atraumatic.  Eyes:     Conjunctiva/sclera: Conjunctivae normal.     Pupils: Pupils are equal, round, and reactive to light.  Cardiovascular:     Rate and Rhythm: Normal rate and regular rhythm.     Heart sounds: Normal heart sounds.  Pulmonary:     Effort: Pulmonary effort is normal.     Breath sounds: Normal breath sounds.  Abdominal:      General: Bowel sounds are normal.     Palpations: Abdomen is soft.  Musculoskeletal:     Cervical back: Normal range of motion and neck supple.  Skin:    General: Skin is warm and dry.     Capillary Refill: Capillary refill takes less than 2 seconds.     Comments: Hypopigmented patches due to vitiligo were noted.  Neurological:     Mental Status: He is alert and oriented to person, place, and time.  Psychiatric:        Behavior: Behavior normal.      Musculoskeletal Exam: Cervical spine was in good range of motion.  He had good range of motion of the lumbar spine.  Bilateral shoulder joints were in good range of motion.  He has some discomfort range of motion of his left shoulder joint.  Elbow joints and wrist joints are in good range of motion without any warmth swelling or effusion.  There was no tenderness over MCPs or PIPs.  Hip joints and knee joints with good range of motion.  There was no tenderness over ankles or MTPs.  CDAI Exam: CDAI Score: -- Patient Global: 1 mm; Provider Global: 1 mm Swollen: --; Tender: -- Joint Exam 08/02/2022   No joint exam has been documented for this visit   There is currently no information documented on the homunculus. Go to the Rheumatology activity and complete the homunculus joint exam.  Investigation: No additional findings.  Imaging: No results found.  Recent Labs: Lab Results  Component Value Date   WBC 4.4 07/05/2022   HGB 13.3 07/05/2022   PLT 187 07/05/2022   NA 139 07/05/2022   K 4.3 07/05/2022   CL 101 07/05/2022   CO2 31 07/05/2022   GLUCOSE 97 07/05/2022   BUN 17 07/05/2022   CREATININE 1.37 (H) 07/05/2022   BILITOT 0.4 07/05/2022   AST 26 07/05/2022   ALT 22 07/05/2022   PROT 7.8 07/05/2022   CALCIUM 9.7 07/05/2022   GFRAA 68 01/17/2021   QFTBGOLDPLUS NEGATIVE 09/26/2021    Speciality Comments: Prior therapy includes: methotrexate (elevated creatinine), Arava ( rash in the past), Imuran (multiple SE's), and  inadequate response to ENBREL, HUMIRA, ORENCIA, XELJANZ and SIMPONI ARIA.  Procedures:  No procedures performed Allergies: Tramadol and Sulfa antibiotics   Assessment / Plan:     Visit Diagnoses: Rheumatoid arthritis involving multiple sites with positive rheumatoid factor (HCC)-patient had no synovitis on examination today.  He states he was off Simponi and leflunomide for about 6 to 8 weeks after his left shoulder surgery.  He had been taking Simponi and leflunomide on a regular basis since September.  He had mild flare in his right ankle joint which eventually resolved.  He had no recurrence of rheumatoid arthritis flares.  High risk medication use - Simponi 50 mg subcu every 28 days and leflunomide 10 mg p.o. daily. 11/16 /2023 CBC was normal, CMP showed creatinine of 1.37.  He is creatinine states mildly elevated.  He was advised to get labs in February and every 3 months to monitor for drug toxicity.  Information on immunization was placed in the AVS.  He was advised to hold Simponi and leflunomide if he develops an infection and resume after the infection resolves.  Use of sunscreen was discussed.  Annual skin examination was advised to screen for skin cancer while he was on Simponi.  Elevated serum creatinine-creatinine stays elevated and stable.  Chronic left shoulder pain-he had left biceps tendon repair in April 2023.  He has been followed by orthopedics closely.  Elevated blood pressure reading-his blood pressure was elevated at 146/103.  Repeat blood pressure was 128/90.  He was advised to monitor blood pressure closely and follow-up with the PCP if the blood pressure stays elevated.  History of gastroesophageal reflux (GERD)-he takes omeprazole.  Vitiligo - vitiligo patches were noted on the extremities.  He was evaluated by Dr. Donzetta Starch.  Hair loss - has noticed some thinning of hair.  Has been followed by Dr. Yetta Barre.  He has been using topical agents.  Seasonal  allergies  Orders: No orders of the defined types were placed in this encounter.  No orders of the defined types were placed in this encounter.    Follow-Up Instructions: Return in about 5 months (around 01/01/2023) for Rheumatoid arthritis.   Pollyann Savoy, MD  Note - This record has been created using Animal nutritionist.  Chart creation errors have been sought, but may not always  have been located. Such creation errors do not reflect on  the standard of medical care.

## 2022-07-25 ENCOUNTER — Other Ambulatory Visit (HOSPITAL_COMMUNITY): Payer: Self-pay

## 2022-07-25 ENCOUNTER — Other Ambulatory Visit: Payer: Self-pay | Admitting: Physician Assistant

## 2022-07-25 MED ORDER — OMEPRAZOLE 40 MG PO CPDR
40.0000 mg | DELAYED_RELEASE_CAPSULE | Freq: Every day | ORAL | 0 refills | Status: DC
  Filled 2022-07-25: qty 90, 90d supply, fill #0

## 2022-07-25 NOTE — Telephone Encounter (Signed)
Next Visit: 08/02/2022  Last Visit: 02/14/2022  Last Fill: 04/24/2022  DX: History of gastroesophageal reflux (GERD)   Current Dose per office note on 02/14/2022: not mentioned.   Okay to refill prilosec?

## 2022-07-31 ENCOUNTER — Other Ambulatory Visit (HOSPITAL_COMMUNITY): Payer: Self-pay

## 2022-08-01 ENCOUNTER — Other Ambulatory Visit (HOSPITAL_COMMUNITY): Payer: Self-pay

## 2022-08-02 ENCOUNTER — Other Ambulatory Visit (HOSPITAL_COMMUNITY): Payer: Self-pay

## 2022-08-02 ENCOUNTER — Other Ambulatory Visit: Payer: Self-pay

## 2022-08-02 ENCOUNTER — Encounter: Payer: Self-pay | Admitting: Rheumatology

## 2022-08-02 ENCOUNTER — Ambulatory Visit: Payer: 59 | Attending: Rheumatology | Admitting: Rheumatology

## 2022-08-02 VITALS — BP 128/90 | HR 86 | Resp 12 | Ht 71.0 in | Wt 181.8 lb

## 2022-08-02 DIAGNOSIS — R7989 Other specified abnormal findings of blood chemistry: Secondary | ICD-10-CM | POA: Diagnosis not present

## 2022-08-02 DIAGNOSIS — J302 Other seasonal allergic rhinitis: Secondary | ICD-10-CM

## 2022-08-02 DIAGNOSIS — R030 Elevated blood-pressure reading, without diagnosis of hypertension: Secondary | ICD-10-CM | POA: Diagnosis not present

## 2022-08-02 DIAGNOSIS — L659 Nonscarring hair loss, unspecified: Secondary | ICD-10-CM | POA: Diagnosis not present

## 2022-08-02 DIAGNOSIS — Z8719 Personal history of other diseases of the digestive system: Secondary | ICD-10-CM | POA: Diagnosis not present

## 2022-08-02 DIAGNOSIS — L80 Vitiligo: Secondary | ICD-10-CM | POA: Diagnosis not present

## 2022-08-02 DIAGNOSIS — M0579 Rheumatoid arthritis with rheumatoid factor of multiple sites without organ or systems involvement: Secondary | ICD-10-CM | POA: Diagnosis not present

## 2022-08-02 DIAGNOSIS — Z79899 Other long term (current) drug therapy: Secondary | ICD-10-CM

## 2022-08-02 DIAGNOSIS — M25512 Pain in left shoulder: Secondary | ICD-10-CM

## 2022-08-02 DIAGNOSIS — G8929 Other chronic pain: Secondary | ICD-10-CM

## 2022-08-02 MED ORDER — FAMOTIDINE 40 MG PO TABS
40.0000 mg | ORAL_TABLET | ORAL | 1 refills | Status: DC
  Filled 2022-08-02: qty 90, 90d supply, fill #0
  Filled 2022-11-07: qty 90, 90d supply, fill #1

## 2022-08-02 NOTE — Patient Instructions (Signed)
Standing Labs We placed an order today for your standing lab work.   Please have your standing labs drawn in February and every 3 months  Please have your labs drawn 2 weeks prior to your appointment so that the provider can discuss your lab results at your appointment.  Please note that you may see your imaging and lab results in MyChart before we have reviewed them. We will contact you once all results are reviewed. Please allow our office up to 72 hours to thoroughly review all of the results before contacting the office for clarification of your results.  Lab hours are:   Monday through Thursday from 8:00 am -12:30 pm and 1:00 pm-5:00 pm and Friday from 8:00 am-12:00 pm.  Please be advised, all patients with office appointments requiring lab work will take precedent over walk-in lab work.   Labs are drawn by [facility]. Please bring your co-pay at the time of your lab draw.  You may receive a bill from [facility] for your lab work.  Please note if you are on Hydroxychloroquine and and an order has been placed for a Hydroxychloroquine level, you will need to have it drawn 4 hours or more after your last dose.  If you wish to have your labs drawn at another location, please call the office 24 hours in advance so we can fax the orders.  The office is located at [address], [place], [place] [address] No appointment is necessary.    If you have any questions regarding directions or hours of operation,  please call [phone].   As a reminder, please drink plenty of water prior to coming for your lab work. Thanks!   Vaccines You are taking a medication(s) that can suppress your immune system.  The following immunizations are recommended: Flu annually Covid-19  Td/Tdap (tetanus, diphtheria, pertussis) every 10 years Pneumonia (Prevnar 15 then Pneumovax 23 at least 1 year apart.  Alternatively, can take Prevnar 20 without needing additional dose) Shingrix: 2 doses from 4 weeks  to 6 months apart  Please check with your PCP to make sure you are up to date.   If you have signs or symptoms of an infection or start antibiotics: First, call your PCP for workup of your infection. Hold your medication through the infection, until you complete your antibiotics, and until symptoms resolve if you take the following: Injectable medication (Actemra, Benlysta, Cimzia, Cosentyx, Enbrel, Humira, Kevzara, Orencia, Remicade, Simponi, Stelara, Taltz, Tremfya) Methotrexate Leflunomide (Arava) Mycophenolate (Cellcept) Harriette Ohara, Olumiant, or Rinvoq  Please get an annual eye examination to screen for skin cancer while you are on Simponi.

## 2022-08-10 ENCOUNTER — Other Ambulatory Visit: Payer: Self-pay

## 2022-08-15 ENCOUNTER — Other Ambulatory Visit (HOSPITAL_COMMUNITY): Payer: Self-pay

## 2022-08-30 ENCOUNTER — Other Ambulatory Visit (HOSPITAL_COMMUNITY): Payer: Self-pay

## 2022-09-01 ENCOUNTER — Other Ambulatory Visit: Payer: Self-pay

## 2022-09-07 ENCOUNTER — Other Ambulatory Visit (HOSPITAL_COMMUNITY): Payer: Self-pay

## 2022-09-11 ENCOUNTER — Other Ambulatory Visit (HOSPITAL_COMMUNITY): Payer: Self-pay

## 2022-09-24 ENCOUNTER — Other Ambulatory Visit (HOSPITAL_COMMUNITY): Payer: Self-pay

## 2022-09-26 ENCOUNTER — Ambulatory Visit: Payer: Commercial Managed Care - PPO | Admitting: Internal Medicine

## 2022-09-26 ENCOUNTER — Other Ambulatory Visit: Payer: Self-pay | Admitting: Physician Assistant

## 2022-09-26 NOTE — Progress Notes (Deleted)
FOLLOW UP Date of Service/Encounter:  09/26/22   Subjective:  Timothy Waters (DOB: 05-15-1961) is a 62 y.o. male with vitiligo and RA who returns to the [facility] on 09/26/2022 in re-evaluation of the following: *** History obtained from: chart review and {Persons; PED relatives w/patient:19415::"patient"}.  For Review, LV was on 01/03/22  with Dr.Leona Alen seen for intial visit for allergic rhinitis, reflux and ashtma.  See below for details of encounter . AIT recommended.   Pertinent History/Diagnostics:  Asthma: Adult onset. Chest tightness, wheezing, occasional nighttime cough. 0 prior hospitalization, no OCS use in 2022. UTD on vaccines.  09/26/21: AEC 326, no prior chest imagning available for review. Controlled on Flovent 220.  - spirometry (01/03/22): ratio 108%, 103% FEV1 Allergic Rhinitis:  Congestion, rhinorrhea, eye itching . Perennial with flares in spring and fall. + reflux with hoarseness, on omperazole 40 mg BID. He sings. Unable to use nasal sprays as this triggers an upper airway cough for him.  - SPT environmental panel (01/03/22): positive to tree pollen, outdoor and indoor molds, and dust mites Food Intolerance:  Red meat, pasta, peppers, white rice, potaotes flare up his RA Sulfa allergy:  30+ years ago work up in full body hives which resolved in 24 hours after getting an injection. Avoiding since. Ongoing avoidance recommended  Today presents for follow-up. ***  Allergies as of 09/26/2022       Reactions   Tramadol Shortness Of Breath   Sulfa Antibiotics Hives        Medication List        Accurate as of September 26, 2022 12:28 PM. If you have any questions, ask your nurse or doctor.          albuterol 108 (90 Base) MCG/ACT inhaler Commonly known as: Ventolin HFA Inhale 2 puffs into the lungs every 4 hours as needed for cough/wheezing   diclofenac Sodium 1 % Gel Commonly known as: VOLTAREN Apply 2-4 grams to affected joint up to  4 times daily as needed   famotidine 40 MG tablet Commonly known as: PEPCID Take 1 tablet (40 mg total) by mouth every morning for antacid. Generic for Pepcid)   fluticasone 220 MCG/ACT inhaler Commonly known as: Flovent HFA Inhale 2 (two) puffs into the lungs two times daily.   fluticasone 50 MCG/ACT nasal spray Commonly known as: FLONASE Place 2 sprays in each nostril daily   FOLGARD PO Take 2 tablets by mouth daily. Dietary supplement   folic acid 1 MG tablet Commonly known as: FOLVITE TAKE 2 TABLETS BY MOUTH DAILY   leflunomide 10 MG tablet Commonly known as: ARAVA TAKE 1 TABLET BY MOUTH ONCE A DAY   Olopatadine HCl 0.2 % Soln Apply 1 drop to eye daily as needed.   omeprazole 40 MG capsule Commonly known as: PRILOSEC Take 1 capsule (40 mg total) by mouth daily.   Simponi 50 MG/0.5ML Sosy Generic drug: Golimumab INJECT 50MG  SUBCUTANEOUSLY MONTHLY.   STOOL SOFTENER PO Take 2 tablets by mouth at bedtime.   Systane Ultra 0.4-0.3 % Soln Generic drug: Polyethyl Glycol-Propyl Glycol Place 1 drop into both eyes 3 (three) times daily.   TURMERIC PO Take by mouth.   UNABLE TO FIND Take 3 tablets by mouth daily. Heal ans Soothe Vitamin   VITAMIN B-12 PO Take by mouth daily.   ZYRTEC PO Take 0.5 tablets by mouth daily.       Past Medical History:  Diagnosis Date   Allergy    Arthritis  Asthma    Past Surgical History:  Procedure Laterality Date   APPENDECTOMY  11/17/2018   CARPAL TUNNEL RELEASE Left 2002   MANDIBLE SURGERY  1994   SHOULDER ARTHROSCOPY WITH BICEPS TENDON REPAIR Left    Otherwise, there have been no changes to his past medical history, surgical history, family history, or social history.  ROS: All others negative except as noted per HPI.   Objective:  There were no vitals taken for this visit. There is no height or weight on file to calculate BMI. Physical Exam: General Appearance:  Alert, cooperative, no distress, appears stated  age  Head:  Normocephalic, without obvious abnormality, atraumatic  Eyes:  Conjunctiva clear, EOM's intact  Nose: Nares normal, {Blank multiple:19196:a:"***","hypertrophic turbinates","normal mucosa","no visible anterior polyps","septum midline"}  Throat: Lips, tongue normal; teeth and gums normal, {Blank multiple:19196:a:"***","normal posterior oropharynx","tonsils 2+","tonsils 3+","no tonsillar exudate","+ cobblestoning"}  Neck: Supple, symmetrical  Lungs:   {Blank multiple:19196:a:"***","clear to auscultation bilaterally","end-expiratory wheezing","wheezing throughout"}, Respirations unlabored, {Blank multiple:19196:a:"***","no coughing","intermittent dry coughing"}  Heart:  {Blank multiple:19196:a:"***","regular rate and rhythm","no murmur"}, Appears well perfused  Extremities: No edema  Skin: Skin color, texture, turgor normal, no rashes or lesions on visualized portions of skin  Neurologic: No gross deficits   Reviewed: ***  Spirometry:  Tracings reviewed. His effort: {Blank single:19197::"Good reproducible efforts.","It was hard to get consistent efforts and there is a question as to whether this reflects a maximal maneuver.","Poor effort, data can not be interpreted.","Variable effort-results affected.","decent for first attempt at spirometry."} FVC: ***L FEV1: ***L, ***% predicted FEV1/FVC ratio: ***% Interpretation: {Blank single:19197::"Spirometry consistent with mild obstructive disease","Spirometry consistent with moderate obstructive disease","Spirometry consistent with severe obstructive disease","Spirometry consistent with possible restrictive disease","Spirometry consistent with mixed obstructive and restrictive disease","Spirometry uninterpretable due to technique","Spirometry consistent with normal pattern","No overt abnormalities noted given today's efforts"}.  Please see scanned spirometry results for details.  Skin Testing: {Blank single:19197::"Select  foods","Environmental allergy panel","Environmental allergy panel and select foods","Food allergy panel","None","Deferred due to recent antihistamines use","deferred due to recent reaction"}. ***Adequate positive and negative controls Results discussed with patient/family.   {Blank single:19197::"Allergy testing results were read and interpreted by myself, documented by clinical staff."," "}  Assessment/Plan   ***  Sigurd Sos, MD  [facility]

## 2022-09-27 ENCOUNTER — Other Ambulatory Visit: Payer: Self-pay

## 2022-09-27 ENCOUNTER — Other Ambulatory Visit (HOSPITAL_COMMUNITY): Payer: Self-pay

## 2022-09-27 ENCOUNTER — Encounter: Payer: Self-pay | Admitting: *Deleted

## 2022-09-27 MED ORDER — LEFLUNOMIDE 10 MG PO TABS
10.0000 mg | ORAL_TABLET | Freq: Every day | ORAL | 0 refills | Status: DC
  Filled 2022-09-27: qty 90, 90d supply, fill #0

## 2022-09-27 NOTE — Telephone Encounter (Signed)
Next Visit: 01/02/2023  Last Visit: 08/02/2022  Last Fill: 07/04/2022  DX: Rheumatoid arthritis involving multiple sites with positive rheumatoid factor   Current Dose per office note 08/02/2022: leflunomide 10 mg p.o. daily.   Labs: 07/05/2022 CBC and CMP are stable.  Creatinine is elevated at 1.37.   Notified patient via my chart he is due to update labs this month.   Okay to refill Arava?

## 2022-10-01 DIAGNOSIS — J309 Allergic rhinitis, unspecified: Secondary | ICD-10-CM | POA: Diagnosis not present

## 2022-10-01 DIAGNOSIS — J45901 Unspecified asthma with (acute) exacerbation: Secondary | ICD-10-CM | POA: Diagnosis not present

## 2022-10-02 NOTE — Progress Notes (Unsigned)
FOLLOW UP Date of Service/Encounter:  10/03/22   Subjective:  Timothy Waters (DOB: 23-May-1961) is a 62 y.o. male with PMHx of vitiligo and RAwho returns to the [facility] on 10/03/2022 in re-evaluation of the following: asthma, allergic rhinitis History obtained from: chart review and patient.   For Review, LV was on 01/03/22  with Dr.Keryl Gholson seen for intial visit for allergic rhinitis, reflux and ashtma.  See below for details of encounter . AIT recommended.    Pertinent History/Diagnostics:  Asthma: Adult onset. Chest tightness, wheezing, occasional nighttime cough. 0 prior hospitalization, no OCS use in 2022. UTD on vaccines.  09/26/21: AEC 326, no prior chest imagning available for review. Controlled on Flovent 220.  - spirometry (01/03/22): ratio 108%, 103% FEV1 Allergic Rhinitis:  Congestion, rhinorrhea, eye itching . Perennial with flares in spring and fall. + reflux with hoarseness, on omperazole 40 mg BID. He sings. Unable to use nasal sprays as this triggers an upper airway cough for him.  - SPT environmental panel (01/03/22): positive to tree pollen, outdoor and indoor molds, and dust mites Environmental controls: DM protection. Food Intolerance:  Red meat, pasta, peppers, white rice, potaotes flare up his RA Sulfa allergy:  30+ years ago work up in full body hives which resolved in 24 hours after getting an injection. Avoiding since. Ongoing avoidance recommended   Today presents for follow-up. He was needing ventolin about 1-2 times per day since around 2 weeks ago. Reports relief with ventolin. Unclear what may have triggered his current flare.  He is taking the Flovent 220 mcg 2 puffs twice a day.  He felt like at night it has been harder to breath starting two weeks so increased ventolin use.  Saw PCP 2 days ago and prescribed prednisone 40 mg daily. Breathing significantly improved, but his BP has been high (only an issue with prednisone). This is his first  course of steroids for asthma this year.  Previously controlled on Flovent 220 mcg 2 puffs BID without need for ventolin. He has been taking zyrtec every night.  Continues GERD medications with control. He is interested in starting allergy injections due to tree pollen allergies and springtime approaching. He has implemented environmental control of DM protection at home.   Allergies as of 10/03/2022       Reactions   Tramadol Shortness Of Breath   Sulfa Antibiotics Hives        Medication List        Accurate as of October 03, 2022 11:15 AM. If you have any questions, ask your nurse or doctor.          Airsupra 90-80 MCG/ACT Aero Generic drug: Albuterol-Budesonide Inhale 2 puffs into the lungs as needed (maximum 12 puffs/day). Started by: Clemon Chambers, MD   albuterol 108 (90 Base) MCG/ACT inhaler Commonly known as: Ventolin HFA Inhale 2 puffs into the lungs every 4 hours as needed for cough/wheezing   diclofenac Sodium 1 % Gel Commonly known as: VOLTAREN Apply 2-4 grams to affected joint up to 4 times daily as needed   famotidine 40 MG tablet Commonly known as: PEPCID Take 1 tablet (40 mg total) by mouth every morning for antacid. Generic for Pepcid)   fluticasone 220 MCG/ACT inhaler Commonly known as: Flovent HFA Inhale 2 (two) puffs into the lungs two times daily.   fluticasone 50 MCG/ACT nasal spray Commonly known as: FLONASE Place 2 sprays in each nostril daily   FOLGARD PO Take 2 tablets by mouth  daily. Dietary supplement   folic acid 1 MG tablet Commonly known as: FOLVITE Take 2 tablets (2 mg total) by mouth daily.   leflunomide 10 MG tablet Commonly known as: ARAVA Take 1 tablet (10 mg total) by mouth daily.   Olopatadine HCl 0.2 % Soln Apply 1 drop to eye daily as needed.   omeprazole 40 MG capsule Commonly known as: PRILOSEC Take 1 capsule (40 mg total) by mouth daily.   predniSONE 20 MG tablet Commonly known as: DELTASONE Take 20 mg  by mouth 2 (two) times daily.   Simponi 50 MG/0.5ML Sosy Generic drug: Golimumab INJECT 50MG SUBCUTANEOUSLY MONTHLY.   STOOL SOFTENER PO Take 2 tablets by mouth at bedtime.   Systane Ultra 0.4-0.3 % Soln Generic drug: Polyethyl Glycol-Propyl Glycol Place 1 drop into both eyes 3 (three) times daily.   TURMERIC PO Take by mouth.   UNABLE TO FIND Take 3 tablets by mouth daily. Heal ans Soothe Vitamin   VITAMIN B-12 PO Take by mouth daily.   ZYRTEC PO Take 0.5 tablets by mouth daily.       Past Medical History:  Diagnosis Date   Allergy    Arthritis    Asthma    Past Surgical History:  Procedure Laterality Date   APPENDECTOMY  11/17/2018   CARPAL TUNNEL RELEASE Left 2002   MANDIBLE SURGERY  1994   SHOULDER ARTHROSCOPY WITH BICEPS TENDON REPAIR Left    Otherwise, there have been no changes to his past medical history, surgical history, family history, or social history.  ROS: All others negative except as noted per HPI.   Objective:  BP (!) 130/90 (BP Location: Left Arm, Patient Position: Sitting, Cuff Size: Normal) Comment: Patient he has started taking Prednisone.  Pulse 87   Temp (!) 97.2 F (36.2 C)   Ht 5' 11"$  (1.803 m)   Wt 184 lb 11.2 oz (83.8 kg)   SpO2 97%   BMI 25.76 kg/m  Body mass index is 25.76 kg/m. Physical Exam: General Appearance:  Alert, cooperative, no distress, appears stated age  Head:  Normocephalic, without obvious abnormality, atraumatic  Eyes:  Conjunctiva clear, EOM's intact  Nose: Nares normal, hypertrophic turbinates, normal mucosa, and no visible anterior polyps  Throat: Lips, tongue normal; teeth and gums normal, normal posterior oropharynx  Neck: Supple, symmetrical  Lungs:   Coarse rhonchi/wheezing throughout , Respirations unlabored, no coughing  Heart:  regular rate and rhythm and no murmur, Appears well perfused  Extremities: No edema  Skin: Skin color, texture, turgor normal, no rashes or lesions on visualized portions  of skin  Neurologic: No gross deficits  Spirometry:  Tracings reviewed. His effort:  effort good, but coughing. Repeatability poor. FVC: 3.78L FEV1: 2.52L, 80% predicted FEV1/FVC ratio: 0.67% Interpretation: Spirometry consistent with mild obstructive disease.  Please see scanned spirometry results for details.  Assessment/Plan   Seasonal and perennial allergic rhinitis: not controlled - allergen avoidance towards tree pollen, outdoor and indoor molds, and dust mites - Continue over the counter antihistamine daily or daily as needed.   -Your options include Zyrtec (Cetirizine) 62m, Claritin (Loratadine) 169m Allegra (Fexofenadine) 18063mor Xyzal (Levocetirinze) 5mg89mcan use nasal saline spray as needed to help clear mucus and keep nose and upper airway mucosa moistened  Prevention:  - once asthma controlled, we can consider allergy injections which will teach your immune system to tolerate the things you are allergic to  - would not consider candidate for rush due to underlyine autoimmune disease and  possible precipitation of flare  Allergic Conjunctivitis: controlled - Consider Allergy Eye drops: great options include Pataday (Olopatadine) or Zaditor (ketotifen) for eye symptoms daily as needed-both sold over the counter if not covered by insurance.   -Avoid eye drops that say red eye relief as they may contain medications that dry out your eyes.   Asthma with current exacerbation - wheezing on exam - Controller Inhaler: Continue Flovent 220 2 puffs twice a day; This Should Be Used Everyday - Rinse mouth out after use  During flares and NOW:   - finish current prednisone course (can take nighttime prednisone and try taking only morning dose starting tomorrow until Saturday)  - in the future if asthma starting to flare, increase Flovent 220 mcg to 3 puffs twice daily for 2 weeks or until symptoms resolve  - take Airsupra 4 puffs every 4 hours while awake for 2-3 days; if still  needing multiple times daily after 3 days please schedule a follow-up  - Rescue Inhaler: Airsupra. Use  2-4 puffs as needed for chest tightness, wheezing, or coughing.  Maximum 12 puffs/day.  Can also use 15 minutes prior to exercise if you have symptoms with activity. - Asthma is not controlled if:  - Symptoms are occurring >2 times a week OR  - >2 times a month nighttime awakenings  - You are requiring systemic steroids (prednisone/steroid injections) more than once per year  - Your require hospitalization for your asthma.  - Please call the clinic to schedule a follow up if these symptoms arise  Reflux: controlled - continue your omeprazole 40 mg in the morning (take 20 minutes before your breakfast) and pepcid 40 mg at night - lifestyle and diet modifications as below  Follow-up in 4-6 weeks, sooner if needed.  If asthma controlled, can discuss starting allergy injections (see below).  Timothy Sos, MD  [facility]

## 2022-10-03 ENCOUNTER — Other Ambulatory Visit (HOSPITAL_COMMUNITY): Payer: Self-pay

## 2022-10-03 ENCOUNTER — Other Ambulatory Visit: Payer: Self-pay

## 2022-10-03 ENCOUNTER — Encounter: Payer: Self-pay | Admitting: Internal Medicine

## 2022-10-03 ENCOUNTER — Ambulatory Visit: Payer: Commercial Managed Care - PPO | Admitting: Internal Medicine

## 2022-10-03 VITALS — BP 130/90 | HR 87 | Temp 97.2°F | Ht 71.0 in | Wt 184.7 lb

## 2022-10-03 DIAGNOSIS — R0602 Shortness of breath: Secondary | ICD-10-CM | POA: Diagnosis not present

## 2022-10-03 DIAGNOSIS — H1013 Acute atopic conjunctivitis, bilateral: Secondary | ICD-10-CM | POA: Diagnosis not present

## 2022-10-03 DIAGNOSIS — J3089 Other allergic rhinitis: Secondary | ICD-10-CM | POA: Diagnosis not present

## 2022-10-03 DIAGNOSIS — K219 Gastro-esophageal reflux disease without esophagitis: Secondary | ICD-10-CM

## 2022-10-03 DIAGNOSIS — J4531 Mild persistent asthma with (acute) exacerbation: Secondary | ICD-10-CM | POA: Diagnosis not present

## 2022-10-03 MED ORDER — AIRSUPRA 90-80 MCG/ACT IN AERO
2.0000 | INHALATION_SPRAY | RESPIRATORY_TRACT | 3 refills | Status: DC | PRN
  Filled 2022-10-03: qty 10.7, 30d supply, fill #0

## 2022-10-03 NOTE — Patient Instructions (Addendum)
Seasonal and perennial allergic rhinitis: - allergen avoidance towards tree pollen, outdoor and indoor molds, and dust mites - Continue over the counter antihistamine daily or daily as needed.   -Your options include Zyrtec (Cetirizine) 49m, Claritin (Loratadine) 121m Allegra (Fexofenadine) 18045mor Xyzal (Levocetirinze) 5mg35mcan use nasal saline spray as needed to help clear mucus and keep nose and upper airway mucosa moistened  Prevention:  - once asthma controlled, we can consider allergy injections which will teach your immune system to tolerate the things you are allergic to  Allergic Conjunctivitis:  - Consider Allergy Eye drops: great options include Pataday (Olopatadine) or Zaditor (ketotifen) for eye symptoms daily as needed-both sold over the counter if not covered by insurance.   -Avoid eye drops that say red eye relief as they may contain medications that dry out your eyes.   Asthma with current exacerbation - wheezing on exam - Controller Inhaler: Continue Flovent 220 2 puffs twice a day; This Should Be Used Everyday - Rinse mouth out after use  During flares and NOW:   - finish current prednisone course (can take nighttime prednisone and try taking only morning dose starting tomorrow until Saturday)  - in the future if asthma starting to flare, increase Flovent 220 mcg to 3 puffs twice daily for 2 weeks or until symptoms resolve  - take Airsupra 4 puffs every 4 hours while awake for 2-3 days; if still needing multiple times daily after 3 days please schedule a follow-up  - Rescue Inhaler:  Airsupra . Use  2-4 puffs as needed for chest tightness, wheezing, or coughing.  Maximum 12 puffs/day.  Can also use 15 minutes prior to exercise if you have symptoms with activity. - Asthma is not controlled if:  - Symptoms are occurring >2 times a week OR  - >2 times a month nighttime awakenings  - You are requiring systemic steroids (prednisone/steroid injections) more than once per  year  - Your require hospitalization for your asthma.  - Please call the clinic to schedule a follow up if these symptoms arise  Reflux:  - continue your omeprazole 40 mg in the morning (take 20 minutes before your breakfast) and pepcid 40 mg at night - lifestyle and diet modifications as below  Follow-up in 4-6 weeks, sooner if needed.  If asthma controlled, can discuss starting allergy injections (see below). ------------------------------------------------------------------------------------------------------------  Gastroesophageal Reflux Induced Respiratory Disease and Laryngopharyngeal Reflux (LPR): Gastroesophageal reflux disease (GERD) is a condition where the contents of the stomach reflux or back up into the esophagus or swallowing tube.  This can result in a variety of clinical symptoms including classic symptoms and atypical symptoms.  Classic symptoms of GERD include: heartburn, chest pain, acid taste in the mouth, and difficulty in swallowing.  Atypical symptoms of GERD include laryngopharyngeal reflux (LPR) and asthma.  LPR occurs when stomach reflux comes all the way up to the throat.  Clinical symptoms include hoarseness, raspy voice, laryngitis, throat clearing, postnasal drip, mucus stuck in the throat, a sensation of a lump in the throat, sore throat, and cough.  Most patients with LPR do not have classic symptoms of GERD.  Asthma can also be triggered by GERD.  The acid stomach fluid can stimulate nerve fibers in the esophagus which can cause an increase in bronchial muscle tone and narrowing of the airways.  Acid stomach contents may also reflux into the trachea and bronchi of the lungs where it can trigger an asthma attack.  Many people with  GERD triggered asthma do not have classic symptoms of GERD.  Diagnosis of LPR and GERD induced asthma is frequently made from a typical history and response to medications.  It may take several months of medications to see a good  response.  Occasionally, a 24-hour esophageal pH probe study must be performed.  Treatment of GERD/LPR includes:   Modification of diet and lifestyle Stop smoking Avoid overeating and lose weight Avoid acidic and fatty foods, chocolate, onions, garlic, peppermint Elevate the head of your bed 6 to 8 inches with blocks or wedge Medications Zantac, Pepcid, Axid, Tagamet Prilosec, Prevacid, Aciphex, Protonix, Nexium Surgery  Reducing Pollen Exposure  The American Academy of Allergy, Asthma and Immunology suggests the following steps to reduce your exposure to pollen during allergy seasons.    Do not hang sheets or clothing out to dry; pollen may collect on these items. Do not mow lawns or spend time around freshly cut grass; mowing stirs up pollen. Keep windows closed at night.  Keep car windows closed while driving. Minimize morning activities outdoors, a time when pollen counts are usually at their highest. Stay indoors as much as possible when pollen counts or humidity is high and on windy days when pollen tends to remain in the air longer. Use air conditioning when possible.  Many air conditioners have filters that trap the pollen spores. Use a HEPA room air filter to remove pollen form the indoor air you breathe.  Control of Mold Allergen   Mold and fungi can grow on a variety of surfaces provided certain temperature and moisture conditions exist.  Outdoor molds grow on plants, decaying vegetation and soil.  The major outdoor mold, Alternaria and Cladosporium, are found in very high numbers during hot and dry conditions.  Generally, a late Summer - Fall peak is seen for common outdoor fungal spores.  Rain will temporarily lower outdoor mold spore count, but counts rise rapidly when the rainy period ends.  The most important indoor molds are Aspergillus and Penicillium.  Dark, humid and poorly ventilated basements are ideal sites for mold growth.  The next most common sites of mold growth  are the bathroom and the kitchen.  Outdoor (Seasonal) Mold Control  Use air conditioning and keep windows closed Avoid exposure to decaying vegetation. Avoid leaf raking. Avoid grain handling. Consider wearing a face mask if working in moldy areas.    Indoor (Perennial) Mold Control   Maintain humidity below 50%. Clean washable surfaces with 5% bleach solution. Remove sources e.g. contaminated carpets.    DUST MITE AVOIDANCE MEASURES:  There are three main measures that need and can be taken to avoid house dust mites:  Reduce accumulation of dust in general -reduce furniture, clothing, carpeting, books, stuffed animals, especially in bedroom  Separate yourself from the dust -use pillow and mattress encasements (can be found at stores such as [employer] or online) -avoid direct exposure to air condition flow -use a HEPA filter device, especially in the bedroom; you can also use a HEPA filter vacuum cleaner -wipe dust with a moist towel instead of a dry towel or broom when cleaning  Decrease mites and/or their secretions -wash clothing and linen and stuffed animals at highest temperature possible, at least every 2 weeks -stuffed animals can also be placed in a bag and put in a freezer overnight  Despite the above measures, it is impossible to eliminate dust mites or their allergen completely from your home.  With the above measures the  burden of mites in your home can be diminished, with the goal of minimizing your allergic symptoms.  Success will be reached only when implementing and using all means together.  General information about allergy shots:  Allergy shots always are given starting at low (very dilute) doses and increase over time to more concentrated allergen. This is called build-up. There are two protocols that we offer for build-up.   Traditional build-up -- weekly or twice weekly visits for several months, then space out to every 2 weeks, then every 3  weeks, then every 4 weeks as above. At these appointments, the shots(s) are administered, followed by the required 30 minute monitoring period.   The ultimate schedule is once monthly injections, which you then continue for 3-5 years for maximum sustained benefit.  It is important that asthma is controlled when on injections. If it is not, you need to skip your injection and schedule follow-up.

## 2022-10-09 ENCOUNTER — Other Ambulatory Visit (HOSPITAL_COMMUNITY): Payer: Self-pay

## 2022-10-09 ENCOUNTER — Other Ambulatory Visit: Payer: Self-pay

## 2022-10-09 ENCOUNTER — Other Ambulatory Visit: Payer: Self-pay | Admitting: *Deleted

## 2022-10-09 ENCOUNTER — Other Ambulatory Visit: Payer: Self-pay | Admitting: Rheumatology

## 2022-10-09 DIAGNOSIS — Z111 Encounter for screening for respiratory tuberculosis: Secondary | ICD-10-CM | POA: Diagnosis not present

## 2022-10-09 DIAGNOSIS — Z9225 Personal history of immunosupression therapy: Secondary | ICD-10-CM | POA: Diagnosis not present

## 2022-10-09 DIAGNOSIS — M0579 Rheumatoid arthritis with rheumatoid factor of multiple sites without organ or systems involvement: Secondary | ICD-10-CM

## 2022-10-09 DIAGNOSIS — Z79899 Other long term (current) drug therapy: Secondary | ICD-10-CM | POA: Diagnosis not present

## 2022-10-09 MED ORDER — SIMPONI 50 MG/0.5ML ~~LOC~~ SOSY
PREFILLED_SYRINGE | SUBCUTANEOUS | 0 refills | Status: DC
  Filled 2022-10-09: qty 0.5, 28d supply, fill #0
  Filled 2022-11-07: qty 0.5, 28d supply, fill #1
  Filled 2022-11-30: qty 0.5, 28d supply, fill #2

## 2022-10-09 NOTE — Telephone Encounter (Signed)
Next Visit: 01/02/2023  Last Visit: 08/02/2022  Last Fill: 07/18/2022  DX: Rheumatoid arthritis involving multiple sites with positive rheumatoid factor   Current Dose per office note 08/02/2022: Simponi 50 mg subcu every 28 days   Labs: 07/05/2022 CBC and CMP are stable.  Creatinine is elevated at 1.37.   TB Gold: 09/26/2021 Neg   Patient  to update labs today.   Okay to refill Simponi?

## 2022-10-10 ENCOUNTER — Ambulatory Visit: Payer: Commercial Managed Care - PPO | Admitting: Internal Medicine

## 2022-10-10 ENCOUNTER — Other Ambulatory Visit (HOSPITAL_COMMUNITY): Payer: Self-pay

## 2022-10-10 NOTE — Progress Notes (Signed)
CBC and CMP are stable.

## 2022-10-11 LAB — COMPLETE METABOLIC PANEL WITH GFR
AG Ratio: 1.3 (calc) (ref 1.0–2.5)
ALT: 17 U/L (ref 9–46)
AST: 21 U/L (ref 10–35)
Albumin: 4.1 g/dL (ref 3.6–5.1)
Alkaline phosphatase (APISO): 82 U/L (ref 35–144)
BUN: 20 mg/dL (ref 7–25)
CO2: 32 mmol/L (ref 20–32)
Calcium: 9.6 mg/dL (ref 8.6–10.3)
Chloride: 103 mmol/L (ref 98–110)
Creat: 1.27 mg/dL (ref 0.70–1.35)
Globulin: 3.2 g/dL (calc) (ref 1.9–3.7)
Glucose, Bld: 96 mg/dL (ref 65–99)
Potassium: 4.1 mmol/L (ref 3.5–5.3)
Sodium: 141 mmol/L (ref 135–146)
Total Bilirubin: 0.4 mg/dL (ref 0.2–1.2)
Total Protein: 7.3 g/dL (ref 6.1–8.1)
eGFR: 65 mL/min/{1.73_m2} (ref >=60)

## 2022-10-11 LAB — QUANTIFERON-TB GOLD PLUS
Mitogen-NIL: >10 IU/mL
NIL: 0.04 IU/mL
QuantiFERON-TB Gold Plus: NEGATIVE
TB1-NIL: <0 IU/mL
TB2-NIL: 0 IU/mL

## 2022-10-11 LAB — CBC WITH DIFFERENTIAL/PLATELET
Absolute Monocytes: 871 cells/uL (ref 200–950)
Basophils Absolute: 72 cells/uL (ref 0–200)
Basophils Relative: 1.1 %
Eosinophils Absolute: 520 cells/uL — ABNORMAL HIGH (ref 15–500)
Eosinophils Relative: 8 %
HCT: 41.7 % (ref 38.5–50.0)
Hemoglobin: 13.4 g/dL (ref 13.2–17.1)
Lymphs Abs: 2165 cells/uL (ref 850–3900)
MCH: 25.5 pg — ABNORMAL LOW (ref 27.0–33.0)
MCHC: 32.1 g/dL (ref 32.0–36.0)
MCV: 79.3 fL — ABNORMAL LOW (ref 80.0–100.0)
MPV: 12.9 fL — ABNORMAL HIGH (ref 7.5–12.5)
Monocytes Relative: 13.4 %
Neutro Abs: 2873 cells/uL (ref 1500–7800)
Neutrophils Relative %: 44.2 %
Platelets: 163 10*3/uL (ref 140–400)
RBC: 5.26 10*6/uL (ref 4.20–5.80)
RDW: 13.9 % (ref 11.0–15.0)
Total Lymphocyte: 33.3 %
WBC: 6.5 10*3/uL (ref 3.8–10.8)

## 2022-10-11 NOTE — Progress Notes (Signed)
TB Gold is negative.

## 2022-10-20 ENCOUNTER — Other Ambulatory Visit: Payer: Self-pay | Admitting: Rheumatology

## 2022-10-21 ENCOUNTER — Other Ambulatory Visit (HOSPITAL_COMMUNITY): Payer: Self-pay

## 2022-10-22 ENCOUNTER — Other Ambulatory Visit: Payer: Self-pay

## 2022-10-22 ENCOUNTER — Other Ambulatory Visit (HOSPITAL_COMMUNITY): Payer: Self-pay

## 2022-10-22 MED ORDER — OMEPRAZOLE 40 MG PO CPDR
40.0000 mg | DELAYED_RELEASE_CAPSULE | Freq: Every day | ORAL | 0 refills | Status: DC
  Filled 2022-10-22: qty 90, 90d supply, fill #0

## 2022-10-22 NOTE — Telephone Encounter (Signed)
Next Visit: 01/02/2023   Last Visit: 08/02/2022   Last Fill: 07/25/2022  Dx: History of gastroesophageal reflux   Current Dose per office note on 08/02/2022: not discussed  Okay to refill Omeprazole?

## 2022-10-23 ENCOUNTER — Other Ambulatory Visit (HOSPITAL_COMMUNITY): Payer: Self-pay

## 2022-10-24 ENCOUNTER — Other Ambulatory Visit: Payer: Self-pay

## 2022-10-24 ENCOUNTER — Other Ambulatory Visit (HOSPITAL_COMMUNITY): Payer: Self-pay

## 2022-10-24 MED ORDER — FLUTICASONE PROPIONATE HFA 220 MCG/ACT IN AERO
2.0000 | INHALATION_SPRAY | Freq: Two times a day (BID) | RESPIRATORY_TRACT | 2 refills | Status: DC
  Filled 2022-10-24: qty 12, 30d supply, fill #0
  Filled 2022-11-18: qty 12, 30d supply, fill #1
  Filled 2023-01-06: qty 12, 30d supply, fill #2

## 2022-11-07 ENCOUNTER — Other Ambulatory Visit (HOSPITAL_COMMUNITY): Payer: Self-pay

## 2022-11-08 ENCOUNTER — Other Ambulatory Visit: Payer: Self-pay

## 2022-11-19 ENCOUNTER — Other Ambulatory Visit: Payer: Self-pay

## 2022-11-30 ENCOUNTER — Other Ambulatory Visit: Payer: Self-pay

## 2022-11-30 ENCOUNTER — Other Ambulatory Visit (HOSPITAL_COMMUNITY): Payer: Self-pay

## 2022-12-03 DIAGNOSIS — H1032 Unspecified acute conjunctivitis, left eye: Secondary | ICD-10-CM | POA: Diagnosis not present

## 2022-12-04 ENCOUNTER — Other Ambulatory Visit: Payer: Self-pay

## 2022-12-05 ENCOUNTER — Other Ambulatory Visit: Payer: Self-pay

## 2022-12-06 ENCOUNTER — Other Ambulatory Visit (HOSPITAL_COMMUNITY): Payer: Self-pay

## 2022-12-10 ENCOUNTER — Other Ambulatory Visit (HOSPITAL_COMMUNITY): Payer: Self-pay

## 2022-12-11 NOTE — Progress Notes (Unsigned)
FOLLOW UP Date of Service/Encounter:  12/12/22   Subjective:  Timothy Waters (DOB: 1961-05-06) is a 62 y.o. male PMHx of vitiligo and RA [facility] in re-evaluation of the following: asthma and alleric rhinitis History obtained from: chart review and patient.  For Review, LV was on 10/03/22  with Dr.Kaneshia Cater seen for routine follow-up. See below for summary of history and diagnostics.  Therapeutic plans/changes recommended: Asthma flared, on prednisone prescribed by PCP.Marland Kitchen First course of steroids in 2024. FEV1 80% with obstructed ratio of 0.67. We switched to [place].   Pertinent History/Diagnostics:  Asthma: Adult onset. Chest tightness, wheezing, occasional nighttime cough. 0 prior hospitalization, no OCS use in 2022. UTD on vaccines.  09/26/21: AEC 326, no prior chest imagning available for review. Controlled on Flovent 220.  - spirometry (01/03/22): ratio 108%, 103% FEV1 Allergic Rhinitis:  Congestion, rhinorrhea, eye itching . Perennial with flares in spring and fall. + reflux with hoarseness, on omperazole 40 mg BID. He sings. Unable to use nasal sprays as this triggers an upper airway cough for him.  - SPT environmental panel (01/03/22): positive to tree pollen, outdoor and indoor molds, and dust mites Environmental controls: DM protection. Food Intolerance:  Red meat, pasta, peppers, white rice, potaotes flare up his RA Sulfa allergy:  30+ years ago work up in full body hives which resolved in 24 hours after getting an injection. Avoiding since. Ongoing avoidance recommended  Today presents for follow-up. Since last visit reports asthma has been well controlled. He has not required additional doses of prednisone. He has been using Airsupra only around once per month. No antibiotics. He feels much more controlled. No more voice hoarseness. Allergic rhinitis continues to bother him and he is ready to start allergy injections. He  continues on his regular antihistamine daily.  His reflux is controlled as long as he takes omeprazole.    Allergies as of 12/12/2022       Reactions   Tramadol Shortness Of Breath   Sulfa Antibiotics Hives        Medication List        Accurate as of December 12, 2022  5:14 PM. If you have any questions, ask your nurse or doctor.          Airsupra 90-80 MCG/ACT Aero Generic drug: Albuterol-Budesonide Inhale 2 puffs into the lungs as needed (maximum 12 puffs/day).   albuterol 108 (90 Base) MCG/ACT inhaler Commonly known as: Ventolin HFA Inhale 2 puffs into the lungs every 4 hours as needed for cough/wheezing   diclofenac Sodium 1 % Gel Commonly known as: VOLTAREN Apply 2-4 grams to affected joint up to 4 times daily as needed   EPINEPHrine 0.3 mg/0.3 mL Soaj injection Commonly known as: EpiPen 2-Pak Inject 0.3 mg into the muscle as needed for anaphylaxis. Started by: Verlee Monte, MD   famotidine 40 MG tablet Commonly known as: PEPCID Take 1 tablet (40 mg total) by mouth every morning for antacid.   fluticasone 220 MCG/ACT inhaler Commonly known as: Flovent HFA Inhale 2 puffs into the lungs 2 (two) times daily.   fluticasone 50 MCG/ACT nasal spray Commonly known as: FLONASE Place 2 sprays in each nostril daily   FOLGARD PO Take 2 tablets by mouth daily. Dietary supplement   folic acid 1 MG tablet Commonly known as: FOLVITE Take 2 tablets (2 mg total) by mouth daily.   hydrocortisone 2.5 % cream Apply 1 Application topically 2 (two) times daily.  leflunomide 10 MG tablet Commonly known as: ARAVA Take 1 tablet (10 mg total) by mouth daily.   Olopatadine HCl 0.2 % Soln Apply 1 drop to eye daily as needed.   omeprazole 40 MG capsule Commonly known as: PRILOSEC Take 1 capsule (40 mg total) by mouth daily.   predniSONE 20 MG tablet Commonly known as: DELTASONE Take 20 mg by mouth 2 (two) times daily.   Simponi 50 MG/0.5ML Sosy Generic drug:  Golimumab INJECT 50MG  SUBCUTANEOUSLY MONTHLY.   STOOL SOFTENER PO Take 2 tablets by mouth at bedtime.   Systane Ultra 0.4-0.3 % Soln Generic drug: Polyethyl Glycol-Propyl Glycol Place 1 drop into both eyes 3 (three) times daily.   tacrolimus 0.1 % ointment Commonly known as: PROTOPIC Apply topically as needed.   TURMERIC PO Take by mouth.   UNABLE TO FIND Take 3 tablets by mouth daily. Heal ans Soothe Vitamin   VITAMIN B-12 PO Take by mouth daily.   ZYRTEC PO Take 0.5 tablets by mouth daily.       Past Medical History:  Diagnosis Date   Allergy    Arthritis    Asthma    Past Surgical History:  Procedure Laterality Date   APPENDECTOMY  11/17/2018   CARPAL TUNNEL RELEASE Left 2002   MANDIBLE SURGERY  1994   SHOULDER ARTHROSCOPY WITH BICEPS TENDON REPAIR Left    Otherwise, there have been no changes to his past medical history, surgical history, family history, or social history.  ROS: All others negative except as noted per HPI.   Objective:  BP (!) 162/98   Pulse 72   Temp 98.4 F (36.9 C) (Temporal)   Ht 5\' 11"  (1.803 m)   Wt 185 lb 6.4 oz (84.1 kg)   SpO2 97%   BMI 25.86 kg/m  Body mass index is 25.86 kg/m. Physical Exam: General Appearance:  Alert, cooperative, no distress, appears stated age  Head:  Normocephalic, without obvious abnormality, atraumatic  Eyes:  Conjunctiva clear, EOM's intact  Nose: Nares normal, hypertrophic turbinates, normal mucosa, and no visible anterior polyps  Throat: Lips, tongue normal; teeth and gums normal, normal posterior oropharynx  Neck: Supple, symmetrical  Lungs:   clear to auscultation bilaterally, Respirations unlabored, no coughing  Heart:  regular rate and rhythm and no murmur, Appears well perfused  Extremities: No edema  Skin: Skin color, texture, turgor normal and no rashes or lesions on visualized portions of skin  Neurologic: No gross deficits  Spirometry:  Tracings reviewed. His effort: Good  reproducible efforts. FVC: 4.26L FEV1: 3.13L, 94% predicted FEV1/FVC ratio: 0.73 Interpretation: Spirometry consistent with normal pattern.  Please see scanned spirometry results for details.  Assessment/Plan   Seasonal and perennial allergic rhinitis: not controlled - allergen avoidance towards tree pollen, outdoor and indoor molds, and dust mites - Continue over the counter antihistamine daily or daily as needed.   -Your options include Zyrtec (Cetirizine) 10mg , Claritin (Loratadine) 10mg , Allegra (Fexofenadine) 180mg , or Xyzal (Levocetirinze) 5mg  - can use nasal saline spray as needed to help clear mucus and keep nose and upper airway mucosa moistened Prevention:  - start allergy injections-call us once you have spoke with insurance Allergic Conjunctivitis: not controlled - Consider Allergy Eye drops: great options include Pataday (Olopatadine) or Zaditor (ketotifen) for eye symptoms daily as needed-both sold over the counter if not covered by insurance.   -Avoid eye drops that say red eye relief as they may contain medications that dry out your eyes.   Asthma-controlled - Controller Inhaler:  Continue Flovent 220 2 puffs twice a day; This Should Be Used Everyday - Rinse mouth out after use During flares:   -  increase Flovent 220 mcg to 3 puffs twice daily for 2 weeks or until symptoms resolve  - take Airsupra 4 puffs every 4 hours while awake for 2-3 days; if still needing multiple times daily after 3 days please schedule a follow-up - Rescue Inhaler: Airsupra. Use  2-4 puffs as needed for chest tightness, wheezing, or coughing.  Maximum 12 puffs/day.  Can also use 15 minutes prior to exercise if you have symptoms with activity. - Asthma is not controlled if:  - Symptoms are occurring >2 times a week OR  - >2 times a month nighttime awakenings  - You are requiring systemic steroids (prednisone/steroid injections) more than once per year  - Your require hospitalization for your  asthma.  - Please call the clinic to schedule a follow up if these symptoms arise  Reflux: controlled - continue your omeprazole 40 mg in the morning (take 20 minutes before your breakfast) and pepcid 40 mg at night - lifestyle and diet modifications as below  Elevated BP  - repeat BP also elevated - advised to follow-up with PCP  Follow-up for allergy injections-call after you have spoke with insurance to get your injection appointment It was a pleasure seeing you again in clinic today! Thank you for allowing me to participate in your care.  Tonny Bollman, MD  [facility]

## 2022-12-12 ENCOUNTER — Ambulatory Visit: Payer: Commercial Managed Care - PPO | Admitting: Internal Medicine

## 2022-12-12 ENCOUNTER — Other Ambulatory Visit: Payer: Self-pay

## 2022-12-12 ENCOUNTER — Encounter: Payer: Self-pay | Admitting: Internal Medicine

## 2022-12-12 VITALS — BP 160/98 | HR 72 | Temp 98.4°F | Ht 71.0 in | Wt 185.4 lb

## 2022-12-12 DIAGNOSIS — K219 Gastro-esophageal reflux disease without esophagitis: Secondary | ICD-10-CM | POA: Diagnosis not present

## 2022-12-12 DIAGNOSIS — J3089 Other allergic rhinitis: Secondary | ICD-10-CM

## 2022-12-12 DIAGNOSIS — J302 Other seasonal allergic rhinitis: Secondary | ICD-10-CM

## 2022-12-12 DIAGNOSIS — R030 Elevated blood-pressure reading, without diagnosis of hypertension: Secondary | ICD-10-CM | POA: Diagnosis not present

## 2022-12-12 DIAGNOSIS — H1013 Acute atopic conjunctivitis, bilateral: Secondary | ICD-10-CM | POA: Diagnosis not present

## 2022-12-12 DIAGNOSIS — J4530 Mild persistent asthma, uncomplicated: Secondary | ICD-10-CM

## 2022-12-12 MED ORDER — EPINEPHRINE 0.3 MG/0.3ML IJ SOAJ: 0.3000 mg | INTRAMUSCULAR | 2 refills | Status: DC | PRN

## 2022-12-12 NOTE — Patient Instructions (Addendum)
Seasonal and perennial allergic rhinitis: - allergen avoidance towards tree pollen, outdoor and indoor molds, and dust mites - Continue over the counter antihistamine daily or daily as needed.   -Your options include Zyrtec (Cetirizine) , Claritin (Loratadine) , Allegra (Fexofenadine) , or Xyzal (Levocetirinze)  - can use nasal saline spray as needed to help clear mucus and keep nose and upper airway mucosa moistened  Prevention:  - start allergy injections-call us once you have spoke with insurance  Allergic Conjunctivitis:  - Consider Allergy Eye drops: great options include Pataday (Olopatadine) or Zaditor (ketotifen) for eye symptoms daily as needed-both sold over the counter if not covered by insurance.   -Avoid eye drops that say red eye relief as they may contain medications that dry out your eyes.   Asthma - Controller Inhaler: Continue Flovent 220 2 puffs twice a day; This Should Be Used Everyday - Rinse mouth out after use  During flares:   -  increase Flovent 220 mcg to 3 puffs twice daily for 2 weeks or until symptoms resolve  - take Airsupra 4 puffs every 4 hours while awake for 2-3 days; if still needing multiple times daily after 3 days please schedule a follow-up  - Rescue Inhaler:  Airsupra . Use  2-4 puffs as needed for chest tightness, wheezing, or coughing.  Maximum 12 puffs/day.  Can also use 15 minutes prior to exercise if you have symptoms with activity. - Asthma is not controlled if:  - Symptoms are occurring >2 times a week OR  - >2 times a month nighttime awakenings  - You are requiring systemic steroids (prednisone/steroid injections) more than once per year  - Your require hospitalization for your asthma.  - Please call the clinic to schedule a follow up if these symptoms arise  Reflux:  - continue your omeprazole 40 mg in the morning (take 20 minutes before your breakfast) and pepcid 40 mg at night - lifestyle and diet modifications as  below  Follow-up for allergy injections-call after you have spoke with insurance to get your injection appointment It was a pleasure seeing you again in clinic today! Thank you for allowing me to participate in your care.  Tonny Bollman, MD [facility]  ------------------------------------------------------------------------------------------------------------  Gastroesophageal Reflux Induced Respiratory Disease and Laryngopharyngeal Reflux (LPR): Gastroesophageal reflux disease (GERD) is a condition where the contents of the stomach reflux or back up into the esophagus or swallowing tube.  This can result in a variety of clinical symptoms including classic symptoms and atypical symptoms.  Classic symptoms of GERD include: heartburn, chest pain, acid taste in the mouth, and difficulty in swallowing.  Atypical symptoms of GERD include laryngopharyngeal reflux (LPR) and asthma.  LPR occurs when stomach reflux comes all the way up to the throat.  Clinical symptoms include hoarseness, raspy voice, laryngitis, throat clearing, postnasal drip, mucus stuck in the throat, a sensation of a lump in the throat, sore throat, and cough.  Most patients with LPR do not have classic symptoms of GERD.  Asthma can also be triggered by GERD.  The acid stomach fluid can stimulate nerve fibers in the esophagus which can cause an increase in bronchial muscle tone and narrowing of the airways.  Acid stomach contents may also reflux into the trachea and bronchi of the lungs where it can trigger an asthma attack.  Many people with GERD triggered asthma do not have classic symptoms of GERD.  Diagnosis of LPR and GERD induced asthma is frequently  made from a typical history and response to medications.  It may take several months of medications to see a good response.  Occasionally, a 24-hour esophageal pH probe study must be performed.  Treatment of GERD/LPR includes:   Modification of diet and  lifestyle Stop smoking Avoid overeating and lose weight Avoid acidic and fatty foods, chocolate, onions, garlic, peppermint Elevate the head of your bed 6 to 8 inches with blocks or wedge Medications Zantac, Pepcid, Axid, Tagamet Prilosec, Prevacid, Aciphex, Protonix, Nexium Surgery  Reducing Pollen Exposure  The American Academy of Allergy, Asthma and Immunology suggests the following steps to reduce your exposure to pollen during allergy seasons.    Do not hang sheets or clothing out to dry; pollen may collect on these items. Do not mow lawns or spend time around freshly cut grass; mowing stirs up pollen. Keep windows closed at night.  Keep car windows closed while driving. Minimize morning activities outdoors, a time when pollen counts are usually at their highest. Stay indoors as much as possible when pollen counts or humidity is high and on windy days when pollen tends to remain in the air longer. Use air conditioning when possible.  Many air conditioners have filters that trap the pollen spores. Use a HEPA room air filter to remove pollen form the indoor air you breathe.  Control of Mold Allergen   Mold and fungi can grow on a variety of surfaces provided certain temperature and moisture conditions exist.  Outdoor molds grow on plants, decaying vegetation and soil.  The major outdoor mold, Alternaria and Cladosporium, are found in very high numbers during hot and dry conditions.  Generally, a late Summer - Fall peak is seen for common outdoor fungal spores.  Rain will temporarily lower outdoor mold spore count, but counts rise rapidly when the rainy period ends.  The most important indoor molds are Aspergillus and Penicillium.  Dark, humid and poorly ventilated basements are ideal sites for mold growth.  The next most common sites of mold growth are the bathroom and the kitchen.  Outdoor (Seasonal) Mold Control  Use air conditioning and keep windows closed Avoid exposure to  decaying vegetation. Avoid leaf raking. Avoid grain handling. Consider wearing a face mask if working in moldy areas.    Indoor (Perennial) Mold Control   Maintain humidity below 50%. Clean washable surfaces with 5% bleach solution. Remove sources e.g. contaminated carpets.    DUST MITE AVOIDANCE MEASURES:  There are three main measures that need and can be taken to avoid house dust mites:  Reduce accumulation of dust in general -reduce furniture, clothing, carpeting, books, stuffed animals, especially in bedroom  Separate yourself from the dust -use pillow and mattress encasements (can be found at stores such as [employer] or online) -avoid direct exposure to air condition flow -use a HEPA filter device, especially in the bedroom; you can also use a HEPA filter vacuum cleaner -wipe dust with a moist towel instead of a dry towel or broom when cleaning  Decrease mites and/or their secretions -wash clothing and linen and stuffed animals at highest temperature possible, at least every 2 weeks -stuffed animals can also be placed in a bag and put in a freezer overnight  Despite the above measures, it is impossible to eliminate dust mites or their allergen completely from your home.  With the above measures the burden of mites in your home can be diminished, with the goal of minimizing your allergic symptoms.  Success will  be reached only when implementing and using all means together.  General information about allergy shots:  Allergy shots always are given starting at low (very dilute) doses and increase over time to more concentrated allergen. This is called build-up. There are two protocols that we offer for build-up.   Traditional build-up -- weekly or twice weekly visits for several months, then space out to every 2 weeks, then every 3 weeks, then every 4 weeks as above. At these appointments, the shots(s) are administered, followed by the required 30 minute monitoring  period.   The ultimate schedule is once monthly injections, which you then continue for 3-5 years for maximum sustained benefit.  It is important that asthma is controlled when on injections. If it is not, you need to skip your injection and schedule follow-up.

## 2022-12-13 ENCOUNTER — Telehealth: Payer: Self-pay

## 2022-12-13 ENCOUNTER — Other Ambulatory Visit (HOSPITAL_COMMUNITY): Payer: Self-pay

## 2022-12-13 ENCOUNTER — Other Ambulatory Visit: Payer: Self-pay

## 2022-12-13 NOTE — Telephone Encounter (Signed)
Received message from [facility] stating that pt's Simponi copay card is rejecting for "filled after coverage termination". If memory serves I believe I had heard mention that [employer] was changing up their copay card program and that pt's would need to re-enroll into their program. Called pt and LVM providing an update, instructions on next steps, and a request to contact me directly if any further assistance was required.  While in the process of also sending him a MyChart message, I received a message from [facility] stating that the pt was on the line with new copay card info. The card info adjudicated successfully, however it only appears to have paid $9. Pt likely now has a Office manager plan which is blocking the pt's copay card from paying any significant amount towards his deductible.   Called pt and provided further update. Advised that he should reach out and speak to a [employer] and see about having his copay card converted into a debit card to pay that balance. Pt confirmed that he does have their phone number on hand already. He is due for his injection today, so I informed him that I would look into whether or not we had any Simponi samples on hand that he could pick up if need be and let him know via MyChart. Pt verbalized understanding to all, will await f/u.  [facility] Coordinator: [phone]

## 2022-12-14 ENCOUNTER — Other Ambulatory Visit (HOSPITAL_COMMUNITY): Payer: Self-pay

## 2022-12-14 ENCOUNTER — Other Ambulatory Visit: Payer: Self-pay

## 2022-12-14 NOTE — Telephone Encounter (Signed)
Per note in [place], patient provided virtual debit card to [employer]. Medication was originally charged to [employer] card on file in error but was refunded and reprocessed on correct debit card from [place]. Simponi shipment went out today.  Nothing further should be needed.  Chesley Mires, PharmD, MPH, BCPS, CPP Clinical Pharmacist (Rheumatology and Pulmonology)

## 2022-12-14 NOTE — Progress Notes (Signed)
Aeroallergen Immunotherapy  Ordering Provider: Dr. Tonny Bollman  Patient Details Name: Timothy Waters MRN: [id number] Date of Birth: Dec 10, 1960  Order 1 of 2  Vial Label: T/DM  0.5 ml (Volume)  1:20 Concentration -- Eastern 10 Tree Mix (also Sweet Gum) 0.2 ml (Volume)  1:20 Concentration -- Box Elder 0.2 ml (Volume)  1:10 Concentration -- Cedar, red 0.5 ml (Volume)   AU Concentration -- Mite Mix (DF 5,000 & DP 5,000)   1.4  ml Extract Subtotal 3.6  ml Diluent 5.0  ml Maintenance Total  Schedule:  B Blue Vial (1:100,000): Schedule B (6 doses) Yellow Vial (1:10,000): Schedule B (6 doses) Green Vial (1:1,000): Schedule B (6 doses) Red Vial (1:100): Schedule A (10 doses)  Special Instructions: Schedule B, red A

## 2022-12-14 NOTE — Progress Notes (Signed)
Aeroallergen Immunotherapy  Ordering Provider: Dr. Tonny Bollman  Patient Details Name: Timothy Waters MRN: [id number] Date of Birth: [birth year]  Order 2 of 2  Vial Label: Molds  0.2 ml (Volume)  1:20 Concentration -- Alternaria alternata 0.2 ml (Volume)  1:20 Concentration -- Cladosporium herbarum 0.2 ml (Volume)  1:10 Concentration -- Aspergillus mix 0.2 ml (Volume)  1:10 Concentration -- Penicillium mix 0.2 ml (Volume)  1:20 Concentration -- Drechslera spicifera   1.0  ml Extract Subtotal 4.0  ml Diluent 5.0  ml Maintenance Total  Schedule:  B Blue Vial (1:100,000): Schedule B (6 doses) Yellow Vial (1:10,000): Schedule B (6 doses) Green Vial (1:1,000): Schedule B (6 doses) Red Vial (1:100): Schedule A (10 doses)  Special Instructions: Schedule B, red A

## 2022-12-14 NOTE — Progress Notes (Signed)
VIALS NOT MADE UNTIL APPT SCHED. 

## 2022-12-20 NOTE — Progress Notes (Signed)
Office Visit Note  Patient: Timothy Waters             Date of Birth: 09/04/1960           MRN: [id number]             PCP: Lonie Peak, PA-C Referring: Lonie Peak, PA-C Visit Date: 01/02/2023 Occupation: @GUAROCC @  Subjective:  Medication management  History of Present Illness: Armanni Hadaway is a 62 y.o. male with history of seropositive rheumatoid arthritis.  He states he has not had any flare of rheumatoid arthritis.  He denies any joint pain or joint swelling.  He has been taking leflunomide 10 mg p.o. daily and Simponi 50 mg subcu every 28 days on a regular basis.  He denies any side effects from the medications.  He has been monitoring his diet and has been active at work.    Activities of Daily Living:  Patient reports morning stiffness for 0 minutes.   Patient Denies nocturnal pain.  Difficulty dressing/grooming: Denies Difficulty climbing stairs: Denies Difficulty getting out of chair: Denies Difficulty using hands for taps, buttons, cutlery, and/or writing: Denies  Review of Systems  Constitutional:  Negative for fatigue.  HENT:  Negative for mouth sores and mouth dryness.   Eyes:  Negative for dryness.  Respiratory:  Negative for shortness of breath.   Cardiovascular:  Negative for chest pain and palpitations.  Gastrointestinal:  Negative for blood in stool, constipation and diarrhea.  Endocrine: Negative for increased urination.  Genitourinary:  Negative for involuntary urination.  Musculoskeletal:  Negative for joint pain, gait problem, joint pain, joint swelling, myalgias, muscle weakness, morning stiffness, muscle tenderness and myalgias.  Skin:  Negative for color change, rash and sensitivity to sunlight.  Allergic/Immunologic: Negative for susceptible to infections.  Neurological:  Negative for dizziness and headaches.  Hematological:  Negative for swollen glands.  Psychiatric/Behavioral:  Negative for depressed mood and sleep disturbance. The patient is  not nervous/anxious.     PMFS History:  Patient Active Problem List   Diagnosis Date Noted   Gastroesophageal reflux disease 01/03/2022   Allergic conjunctivitis of both eyes 01/03/2022   Seasonal and perennial allergic rhinitis 01/03/2022   Mild intermittent asthma 01/03/2022   Allergy to sulfa drugs 01/03/2022   Seasonal allergies 01/12/2020   History of gastroesophageal reflux (GERD) 01/01/2017   Rheumatoid arthritis involving multiple sites with positive rheumatoid factor (HCC) 11/08/2016   High risk medication use 11/08/2016   Vitiligo 11/08/2016    Past Medical History:  Diagnosis Date   Allergy    Arthritis    Asthma     Family History  Problem Relation Age of Onset   Diabetes Mother    Cancer Mother    COPD Father    Diabetes Father    Past Surgical History:  Procedure Laterality Date   APPENDECTOMY  11/17/2018   CARPAL TUNNEL RELEASE Left 2002   MANDIBLE SURGERY  1994   SHOULDER ARTHROSCOPY WITH BICEPS TENDON REPAIR Left 03/12/2022   Social History   Social History Narrative   Not on file   Immunization History  Administered Date(s) Administered   Influenza,inj,Quad PF,6+ Mos 06/03/2017, 06/02/2018   Moderna Sars-Covid-2 Vaccination 10/16/2019, 11/18/2019, 05/13/2020, 03/03/2021     Objective: Vital Signs: BP (!) 164/105 (BP Location: Left Arm, Patient Position: Sitting, Cuff Size: Normal)   Pulse 83   Resp 16   Ht 5\' 11"  (1.803 m)   Wt 187 lb (84.8 kg)   BMI 26.08 kg/m  Physical Exam Vitals and nursing note reviewed.  Constitutional:      Appearance: He is well-developed.  HENT:     Head: Normocephalic and atraumatic.  Eyes:     Conjunctiva/sclera: Conjunctivae normal.     Pupils: Pupils are equal, round, and reactive to light.  Cardiovascular:     Rate and Rhythm: Normal rate and regular rhythm.     Heart sounds: Normal heart sounds.  Pulmonary:     Effort: Pulmonary effort is normal.     Breath sounds: Normal breath sounds.   Abdominal:     General: Bowel sounds are normal.     Palpations: Abdomen is soft.  Musculoskeletal:     Cervical back: Normal range of motion and neck supple.  Skin:    General: Skin is warm and dry.     Capillary Refill: Capillary refill takes less than 2 seconds.  Neurological:     Mental Status: He is alert and oriented to person, place, and time.  Psychiatric:        Behavior: Behavior normal.      Musculoskeletal Exam: Cervical, thoracic and lumbar spine were in good range of motion.  Shoulder joints, elbow joints, wrist joints, MCPs PIPs and DIPs Juengel range of motion with no synovitis.  Hip joints and knee joints in good range of motion without any warmth swelling or effusion.  There was no tenderness over ankles or MTPs.  CDAI Exam: CDAI Score: -- Patient Global: 0 mm; Provider Global: 0 mm Swollen: --; Tender: -- Joint Exam 01/02/2023   No joint exam has been documented for this visit   There is currently no information documented on the homunculus. Go to the Rheumatology activity and complete the homunculus joint exam.  Investigation: No additional findings.  Imaging: No results found.  Recent Labs: Lab Results  Component Value Date   WBC 5.5 12/31/2022   HGB 14.1 12/31/2022   PLT 161 12/31/2022   NA 138 12/31/2022   K 4.4 12/31/2022   CL 102 12/31/2022   CO2 29 12/31/2022   GLUCOSE 103 (H) 12/31/2022   BUN 14 12/31/2022   CREATININE 1.30 12/31/2022   BILITOT 0.4 12/31/2022   AST 23 12/31/2022   ALT 16 12/31/2022   PROT 7.8 12/31/2022   CALCIUM 9.7 12/31/2022   GFRAA 68 01/17/2021   QFTBGOLDPLUS NEGATIVE 10/09/2022    Speciality Comments: Prior therapy includes: methotrexate (elevated creatinine), Arava ( rash in the past), Imuran (multiple SE's), and inadequate response to ENBREL, HUMIRA, ORENCIA, XELJANZ and SIMPONI ARIA.  Procedures:  No procedures performed Allergies: Tramadol and Sulfa antibiotics   Assessment / Plan:     Visit  Diagnoses: Rheumatoid arthritis involving multiple sites with positive rheumatoid factor (HCC)-he had no synovitis on the examination today.  He denies any flares of rheumatoid arthritis.  He has been taking simply subcu injections and leflunomide on a regular basis without interruption in the treatment.  High risk medication use - Simponi 50 mg subcu every 28 days and leflunomide 10 mg p.o. daily.  Labs obtained on Dec 31, 2022 CBC and CMP were normal.  TB Gold was negative on October 09, 2022.  Information on immunization was placed in the AVS.  He was advised to hold Simponi and leflunomide if he develops an infection resume after the infection resolves.  Annual skin examination to screen for skin cancer was advised.  Use of sunscreen and sun protection was discussed.  Chronic left shoulder pain -he had good range of motion  today without any discomfort.  He had left biceps tendon repair in April 2023.  He has been followed by orthopedics closely.  Elevated blood pressure reading-blood pressure was 145/102 today.  Repeat blood pressure was 164/105.  Patient states that he has been taking blood pressure at home which has been normal.  I advised him to take his blood pressure when he gets home.  He should keep a log of his blood pressure for this week.  If his blood pressure stays elevated more than 120/80 then he should contact his PCP.  Complications of elevated blood pressure including heart disease, kidney disease and stroke were discussed at length.  History of gastroesophageal reflux (GERD)-he is on Pepcid 40 mg p.o. every morning and omeprazole 40 mg p.o. nightly.  Vitiligo - He was evaluated by Dr. Donzetta Starch.  Patient states he is not following with Dr. Yetta Barre now.  Annual skin examination to screen for skin cancer was advised.  Hair loss - Has been followed by Dr. Yetta Barre in the past.  Seasonal allergies  Orders: No orders of the defined types were placed in this encounter.  No orders of  the defined types were placed in this encounter.   Follow-Up Instructions: Return in about 5 months (around 06/04/2023) for Rheumatoid arthritis.   Pollyann Savoy, MD  Note - This record has been created using Animal nutritionist.  Chart creation errors have been sought, but may not always  have been located. Such creation errors do not reflect on  the standard of medical care.

## 2022-12-31 ENCOUNTER — Other Ambulatory Visit: Payer: Self-pay

## 2022-12-31 ENCOUNTER — Other Ambulatory Visit (HOSPITAL_COMMUNITY): Payer: Self-pay

## 2022-12-31 ENCOUNTER — Other Ambulatory Visit: Payer: Self-pay | Admitting: *Deleted

## 2022-12-31 ENCOUNTER — Other Ambulatory Visit: Payer: Self-pay | Admitting: Rheumatology

## 2022-12-31 DIAGNOSIS — Z79899 Other long term (current) drug therapy: Secondary | ICD-10-CM | POA: Diagnosis not present

## 2022-12-31 DIAGNOSIS — M0579 Rheumatoid arthritis with rheumatoid factor of multiple sites without organ or systems involvement: Secondary | ICD-10-CM

## 2022-12-31 LAB — CBC WITH DIFFERENTIAL/PLATELET
Basophils Absolute: 50 cells/uL (ref 0–200)
Eosinophils Absolute: 418 cells/uL (ref 15–500)
Hemoglobin: 14.1 g/dL (ref 13.2–17.1)
Lymphs Abs: 2250 cells/uL (ref 850–3900)
Monocytes Relative: 15.4 %
Neutrophils Relative %: 35.2 %
Total Lymphocyte: 40.9 %

## 2022-12-31 MED ORDER — SIMPONI 50 MG/0.5ML ~~LOC~~ SOSY
PREFILLED_SYRINGE | SUBCUTANEOUS | 0 refills | Status: DC
Start: 2022-12-31 — End: 2023-01-29
  Filled 2022-12-31: qty 0.5, 28d supply, fill #0

## 2022-12-31 NOTE — Telephone Encounter (Signed)
Last Fill: 10/09/2022  Labs: 10/09/2022 CBC and CMP are stable.   TB Gold: 10/09/2022  TB Gold is negative.   Next Visit: 01/02/2023  Last Visit: 08/02/2022  ZO:[id number] arthritis involving multiple sites with positive rheumatoid factor   Current Dose per office note 08/02/2022: Simponi 50 mg subcu every 28 days   Okay to refill Simponi?

## 2023-01-01 ENCOUNTER — Ambulatory Visit: Payer: Commercial Managed Care - PPO

## 2023-01-01 LAB — COMPLETE METABOLIC PANEL WITH GFR
AG Ratio: 1.3 (calc) (ref 1.0–2.5)
ALT: 16 U/L (ref 9–46)
AST: 23 U/L (ref 10–35)
Albumin: 4.4 g/dL (ref 3.6–5.1)
Alkaline phosphatase (APISO): 77 U/L (ref 35–144)
BUN: 14 mg/dL (ref 7–25)
CO2: 29 mmol/L (ref 20–32)
Calcium: 9.7 mg/dL (ref 8.6–10.3)
Chloride: 102 mmol/L (ref 98–110)
Creat: 1.3 mg/dL (ref 0.70–1.35)
Globulin: 3.4 g/dL (calc) (ref 1.9–3.7)
Glucose, Bld: 103 mg/dL — ABNORMAL HIGH (ref 65–99)
Potassium: 4.4 mmol/L (ref 3.5–5.3)
Sodium: 138 mmol/L (ref 135–146)
Total Bilirubin: 0.4 mg/dL (ref 0.2–1.2)
Total Protein: 7.8 g/dL (ref 6.1–8.1)
eGFR: 63 mL/min/{1.73_m2} (ref >=60)

## 2023-01-01 LAB — CBC WITH DIFFERENTIAL/PLATELET
Absolute Monocytes: 847 cells/uL (ref 200–950)
Basophils Relative: 0.9 %
Eosinophils Relative: 7.6 %
HCT: 44.5 % (ref 38.5–50.0)
MCH: 25.4 pg — ABNORMAL LOW (ref 27.0–33.0)
MCHC: 31.7 g/dL — ABNORMAL LOW (ref 32.0–36.0)
MCV: 80 fL (ref 80.0–100.0)
Neutro Abs: 1936 cells/uL (ref 1500–7800)
Platelets: 161 10*3/uL (ref 140–400)
RBC: 5.56 10*6/uL (ref 4.20–5.80)
RDW: 13.3 % (ref 11.0–15.0)
WBC: 5.5 10*3/uL (ref 3.8–10.8)

## 2023-01-01 NOTE — Progress Notes (Signed)
CBC and CMP normal

## 2023-01-02 ENCOUNTER — Encounter: Payer: Self-pay | Admitting: Rheumatology

## 2023-01-02 ENCOUNTER — Ambulatory Visit: Payer: Commercial Managed Care - PPO | Attending: Rheumatology | Admitting: Rheumatology

## 2023-01-02 ENCOUNTER — Other Ambulatory Visit (HOSPITAL_COMMUNITY): Payer: Self-pay

## 2023-01-02 VITALS — BP 164/105 | HR 83 | Resp 16 | Ht 71.0 in | Wt 187.0 lb

## 2023-01-02 DIAGNOSIS — M0579 Rheumatoid arthritis with rheumatoid factor of multiple sites without organ or systems involvement: Secondary | ICD-10-CM

## 2023-01-02 DIAGNOSIS — J302 Other seasonal allergic rhinitis: Secondary | ICD-10-CM

## 2023-01-02 DIAGNOSIS — Z8719 Personal history of other diseases of the digestive system: Secondary | ICD-10-CM | POA: Diagnosis not present

## 2023-01-02 DIAGNOSIS — M25512 Pain in left shoulder: Secondary | ICD-10-CM

## 2023-01-02 DIAGNOSIS — G8929 Other chronic pain: Secondary | ICD-10-CM

## 2023-01-02 DIAGNOSIS — L80 Vitiligo: Secondary | ICD-10-CM

## 2023-01-02 DIAGNOSIS — Z79899 Other long term (current) drug therapy: Secondary | ICD-10-CM | POA: Diagnosis not present

## 2023-01-02 DIAGNOSIS — L659 Nonscarring hair loss, unspecified: Secondary | ICD-10-CM | POA: Diagnosis not present

## 2023-01-02 DIAGNOSIS — R7989 Other specified abnormal findings of blood chemistry: Secondary | ICD-10-CM

## 2023-01-02 DIAGNOSIS — R030 Elevated blood-pressure reading, without diagnosis of hypertension: Secondary | ICD-10-CM | POA: Diagnosis not present

## 2023-01-02 NOTE — Patient Instructions (Signed)
Standing Labs We placed an order today for your standing lab work.   Please have your standing labs drawn in August and every 3 months  Please have your labs drawn 2 weeks prior to your appointment so that the provider can discuss your lab results at your appointment, if possible.  Please note that you may see your imaging and lab results in MyChart before we have reviewed them. We will contact you once all results are reviewed. Please allow our office up to 72 hours to thoroughly review all of the results before contacting the office for clarification of your results.  WALK-IN LAB HOURS  Monday through Thursday from 8:00 am -12:30 pm and 1:00 pm-5:00 pm and Friday from 8:00 am-12:00 pm.  Patients with office visits requiring labs will be seen before walk-in labs.  You may encounter longer than normal wait times. Please allow additional time. Wait times may be shorter on  Monday and Thursday afternoons.  We do not book appointments for walk-in labs. We appreciate your patience and understanding with our staff.   Labs are drawn by [facility]. Please bring your co-pay at the time of your lab draw.  You may receive a bill from [facility] for your lab work.  Please note if you are on Hydroxychloroquine and and an order has been placed for a Hydroxychloroquine level,  you will need to have it drawn 4 hours or more after your last dose.  If you wish to have your labs drawn at another location, please call the office 24 hours in advance so we can fax the orders.  The office is located at [address], [address], [place], [place] [address]   If you have any questions regarding directions or hours of operation,  please call [phone].   As a reminder, please drink plenty of water prior to coming for your lab work. Thanks!   Vaccines You are taking a medication(s) that can suppress your immune system.  The following immunizations are recommended: Flu annually Covid-19  Td/Tdap (tetanus,  diphtheria, pertussis) every 10 years Pneumonia (Prevnar 15 then Pneumovax 23 at least 1 year apart.  Alternatively, can take Prevnar 20 without needing additional dose) Shingrix: 2 doses from 4 weeks to 6 months apart  Please check with your PCP to make sure you are up to date.   If you have signs or symptoms of an infection or start antibiotics: First, call your PCP for workup of your infection. Hold your medication through the infection, until you complete your antibiotics, and until symptoms resolve if you take the following: Injectable medication (Actemra, Benlysta, Cimzia, Cosentyx, Enbrel, Humira, Kevzara, Orencia, Remicade, Simponi, Stelara, Taltz, Tremfya) Methotrexate Leflunomide (Arava) Mycophenolate (Cellcept) [place]borne [place], or Rinvoq  Get an annual skin examination to screen for skin cancer while you are on Simponi.  Please use sunscreen and sun protection.

## 2023-01-03 ENCOUNTER — Other Ambulatory Visit (HOSPITAL_COMMUNITY): Payer: Self-pay

## 2023-01-06 ENCOUNTER — Other Ambulatory Visit: Payer: Self-pay | Admitting: Physician Assistant

## 2023-01-07 ENCOUNTER — Other Ambulatory Visit: Payer: Self-pay

## 2023-01-07 ENCOUNTER — Other Ambulatory Visit (HOSPITAL_COMMUNITY): Payer: Self-pay

## 2023-01-07 MED ORDER — LEFLUNOMIDE 10 MG PO TABS
10.0000 mg | ORAL_TABLET | Freq: Every day | ORAL | 0 refills | Status: DC
  Filled 2023-01-07: qty 90, 90d supply, fill #0

## 2023-01-07 NOTE — Telephone Encounter (Signed)
Last Fill: 09/27/2022  Labs: 12/31/2022 CBC and CMP normal.   Next Visit: 06/19/2023  Last Visit: 01/02/2023  DX: Rheumatoid arthritis involving multiple sites with positive rheumatoid factor   Current Dose per office note 01/02/2023: leflunomide 10 mg p.o. daily   Okay to refill Arava ?

## 2023-01-14 ENCOUNTER — Other Ambulatory Visit: Payer: Self-pay | Admitting: Physician Assistant

## 2023-01-15 ENCOUNTER — Other Ambulatory Visit: Payer: Self-pay

## 2023-01-15 MED ORDER — OMEPRAZOLE 40 MG PO CPDR
40.0000 mg | DELAYED_RELEASE_CAPSULE | Freq: Every day | ORAL | 0 refills | Status: DC
  Filled 2023-01-15: qty 90, 90d supply, fill #0

## 2023-01-15 NOTE — Telephone Encounter (Signed)
Last Fill: 10/22/2022  Next Visit: 06/19/2023  Last Visit: 01/02/2023  Dx: History of gastroesophageal reflux (GERD)   Current Dose per office note on 01/02/2023: omeprazole 40 mg p.o. nightly.   Okay to refill Omeprazole?

## 2023-01-15 NOTE — Progress Notes (Signed)
VIALS EXP 01-15-24 

## 2023-01-16 DIAGNOSIS — J3089 Other allergic rhinitis: Secondary | ICD-10-CM | POA: Diagnosis not present

## 2023-01-17 DIAGNOSIS — J302 Other seasonal allergic rhinitis: Secondary | ICD-10-CM | POA: Diagnosis not present

## 2023-01-24 ENCOUNTER — Other Ambulatory Visit (HOSPITAL_COMMUNITY): Payer: Self-pay

## 2023-01-24 ENCOUNTER — Ambulatory Visit (INDEPENDENT_AMBULATORY_CARE_PROVIDER_SITE_OTHER): Payer: Commercial Managed Care - PPO

## 2023-01-24 DIAGNOSIS — J309 Allergic rhinitis, unspecified: Secondary | ICD-10-CM | POA: Diagnosis not present

## 2023-01-24 MED ORDER — EPINEPHRINE 0.3 MG/0.3ML IJ SOAJ
0.3000 mg | INTRAMUSCULAR | 1 refills | Status: DC | PRN
  Filled 2023-01-24: qty 2, 10d supply, fill #0

## 2023-01-24 NOTE — Progress Notes (Signed)
Immunotherapy   Patient Details  Name: Timothy Waters MRN: [id number] Date of Birth: [birth year]  01/24/2023  [name] started injections for  molds, trees, dust mites Following schedule: B  Frequency:1 time per week Epi-Pen:Prescription for Epi-Pen given Consent signed in office today and patient instructions given. Patient waited in room twenty seven for thirty minutes without an issue.    Ralene Muskrat 01/24/2023, 4:08 PM

## 2023-01-29 ENCOUNTER — Other Ambulatory Visit: Payer: Self-pay | Admitting: Physician Assistant

## 2023-01-29 ENCOUNTER — Other Ambulatory Visit (HOSPITAL_COMMUNITY): Payer: Self-pay

## 2023-01-29 ENCOUNTER — Other Ambulatory Visit: Payer: Self-pay

## 2023-01-29 DIAGNOSIS — M0579 Rheumatoid arthritis with rheumatoid factor of multiple sites without organ or systems involvement: Secondary | ICD-10-CM

## 2023-01-29 MED ORDER — SIMPONI 50 MG/0.5ML ~~LOC~~ SOSY
PREFILLED_SYRINGE | SUBCUTANEOUS | 2 refills | Status: DC
Start: 2023-01-29 — End: 2023-04-18
  Filled 2023-01-29 – 2023-01-30 (×2): qty 0.5, 28d supply, fill #0
  Filled 2023-02-22: qty 0.5, 28d supply, fill #1
  Filled 2023-03-20: qty 0.5, 28d supply, fill #2

## 2023-01-29 NOTE — Telephone Encounter (Signed)
Last Fill: 12/31/2022 (30 day supply)  Labs: 12/31/2022 CBC and CMP normal.   TB Gold: 10/09/2022 Neg   Next Visit: 06/19/2023  Last Visit: 01/02/2023  DX: Rheumatoid arthritis involving multiple sites with positive rheumatoid factor   Current Dose per office note 01/02/2023: Simponi 50 mg subcu every 28 days   Okay to refill Simponi?

## 2023-01-30 ENCOUNTER — Other Ambulatory Visit (HOSPITAL_COMMUNITY): Payer: Self-pay

## 2023-01-30 ENCOUNTER — Other Ambulatory Visit: Payer: Self-pay

## 2023-01-30 MED ORDER — FAMOTIDINE 40 MG PO TABS
40.0000 mg | ORAL_TABLET | ORAL | 1 refills | Status: DC
  Filled 2023-01-30: qty 90, 90d supply, fill #0
  Filled 2023-05-11: qty 90, 90d supply, fill #1

## 2023-01-30 MED ORDER — FLUTICASONE PROPIONATE HFA 220 MCG/ACT IN AERO
2.0000 | INHALATION_SPRAY | Freq: Two times a day (BID) | RESPIRATORY_TRACT | 2 refills | Status: DC
  Filled 2023-01-30: qty 12, 30d supply, fill #0
  Filled 2023-03-19: qty 12, 30d supply, fill #1
  Filled 2023-04-15: qty 12, 30d supply, fill #2

## 2023-01-31 ENCOUNTER — Ambulatory Visit (INDEPENDENT_AMBULATORY_CARE_PROVIDER_SITE_OTHER): Payer: Commercial Managed Care - PPO

## 2023-01-31 DIAGNOSIS — J309 Allergic rhinitis, unspecified: Secondary | ICD-10-CM | POA: Diagnosis not present

## 2023-02-01 ENCOUNTER — Other Ambulatory Visit (HOSPITAL_COMMUNITY): Payer: Self-pay

## 2023-02-01 ENCOUNTER — Other Ambulatory Visit: Payer: Self-pay

## 2023-02-04 ENCOUNTER — Other Ambulatory Visit (HOSPITAL_COMMUNITY): Payer: Self-pay

## 2023-02-07 ENCOUNTER — Ambulatory Visit (INDEPENDENT_AMBULATORY_CARE_PROVIDER_SITE_OTHER): Payer: Commercial Managed Care - PPO

## 2023-02-07 DIAGNOSIS — J309 Allergic rhinitis, unspecified: Secondary | ICD-10-CM

## 2023-02-14 ENCOUNTER — Ambulatory Visit (INDEPENDENT_AMBULATORY_CARE_PROVIDER_SITE_OTHER): Payer: Commercial Managed Care - PPO

## 2023-02-14 DIAGNOSIS — J309 Allergic rhinitis, unspecified: Secondary | ICD-10-CM | POA: Diagnosis not present

## 2023-02-15 ENCOUNTER — Encounter: Payer: Self-pay | Admitting: Internal Medicine

## 2023-02-18 NOTE — Telephone Encounter (Signed)
Hi Timothy Waters. I am not sure it's related to the injection. I would not change his dosing and he is okay to come for his next one. He should make sure his reflux is controlled. He can try an extra zyrtec to see if it makes a difference with the hoarseness (two zyrtec daily for a few days).

## 2023-02-22 ENCOUNTER — Other Ambulatory Visit (HOSPITAL_COMMUNITY): Payer: Self-pay

## 2023-02-25 ENCOUNTER — Ambulatory Visit (INDEPENDENT_AMBULATORY_CARE_PROVIDER_SITE_OTHER): Payer: Commercial Managed Care - PPO | Admitting: *Deleted

## 2023-02-25 ENCOUNTER — Other Ambulatory Visit (HOSPITAL_COMMUNITY): Payer: Self-pay

## 2023-02-25 DIAGNOSIS — J309 Allergic rhinitis, unspecified: Secondary | ICD-10-CM | POA: Diagnosis not present

## 2023-02-26 ENCOUNTER — Other Ambulatory Visit (HOSPITAL_COMMUNITY): Payer: Self-pay

## 2023-03-04 ENCOUNTER — Ambulatory Visit (INDEPENDENT_AMBULATORY_CARE_PROVIDER_SITE_OTHER): Payer: Commercial Managed Care - PPO | Admitting: *Deleted

## 2023-03-04 DIAGNOSIS — J309 Allergic rhinitis, unspecified: Secondary | ICD-10-CM

## 2023-03-12 ENCOUNTER — Ambulatory Visit (INDEPENDENT_AMBULATORY_CARE_PROVIDER_SITE_OTHER): Payer: Commercial Managed Care - PPO | Admitting: *Deleted

## 2023-03-12 DIAGNOSIS — J309 Allergic rhinitis, unspecified: Secondary | ICD-10-CM | POA: Diagnosis not present

## 2023-03-18 ENCOUNTER — Ambulatory Visit (INDEPENDENT_AMBULATORY_CARE_PROVIDER_SITE_OTHER): Payer: Commercial Managed Care - PPO

## 2023-03-18 DIAGNOSIS — J309 Allergic rhinitis, unspecified: Secondary | ICD-10-CM

## 2023-03-19 ENCOUNTER — Other Ambulatory Visit: Payer: Self-pay

## 2023-03-20 ENCOUNTER — Other Ambulatory Visit: Payer: Self-pay

## 2023-03-25 ENCOUNTER — Ambulatory Visit (INDEPENDENT_AMBULATORY_CARE_PROVIDER_SITE_OTHER): Payer: Commercial Managed Care - PPO

## 2023-03-25 DIAGNOSIS — J309 Allergic rhinitis, unspecified: Secondary | ICD-10-CM | POA: Diagnosis not present

## 2023-03-26 ENCOUNTER — Other Ambulatory Visit: Payer: Self-pay

## 2023-03-31 ENCOUNTER — Other Ambulatory Visit: Payer: Self-pay | Admitting: Physician Assistant

## 2023-03-31 ENCOUNTER — Other Ambulatory Visit: Payer: Self-pay | Admitting: Rheumatology

## 2023-03-31 DIAGNOSIS — Z79899 Other long term (current) drug therapy: Secondary | ICD-10-CM

## 2023-04-01 ENCOUNTER — Ambulatory Visit (INDEPENDENT_AMBULATORY_CARE_PROVIDER_SITE_OTHER): Payer: Commercial Managed Care - PPO

## 2023-04-01 ENCOUNTER — Other Ambulatory Visit: Payer: Self-pay

## 2023-04-01 DIAGNOSIS — J309 Allergic rhinitis, unspecified: Secondary | ICD-10-CM | POA: Diagnosis not present

## 2023-04-01 MED ORDER — FOLIC ACID 1 MG PO TABS
2.0000 mg | ORAL_TABLET | Freq: Every day | ORAL | 3 refills | Status: DC
  Filled 2023-04-01: qty 180, 90d supply, fill #0
  Filled 2023-06-28: qty 180, 90d supply, fill #1
  Filled 2023-09-27: qty 180, 90d supply, fill #2
  Filled 2023-12-24: qty 180, 90d supply, fill #3

## 2023-04-01 MED ORDER — LEFLUNOMIDE 10 MG PO TABS
10.0000 mg | ORAL_TABLET | Freq: Every day | ORAL | 0 refills | Status: DC
  Filled 2023-04-01: qty 90, 90d supply, fill #0

## 2023-04-01 NOTE — Telephone Encounter (Signed)
Last Fill: 04/06/2022  Next Visit: 06/19/2023   Last Visit: 01/02/2023  Dx:  Rheumatoid arthritis involving multiple sites with positive rheumatoid factor   Current Dose per office note on 01/02/2023: not mentioned (Patient not on MTX)   Okay to refill Folic Acid?

## 2023-04-01 NOTE — Telephone Encounter (Signed)
Please clarify if he is still taking folic acid? Does he need a refill?

## 2023-04-01 NOTE — Telephone Encounter (Signed)
Last Fill: 01/07/2023  Labs: 12/31/2022 CBC and CMP normal.   Next Visit: 06/19/2023  Last Visit: 01/02/2023  DX: Rheumatoid arthritis involving multiple sites with positive rheumatoid factor   Current Dose per office note 01/02/2023: leflunomide 10 mg p.o. daily   Patient advised he is due to update labs. Patient states he will update labs this week.   Okay to refill Arava ?

## 2023-04-01 NOTE — Telephone Encounter (Signed)
Patient states he is still taking the Folic Acid and would like to have it refilled.

## 2023-04-03 ENCOUNTER — Other Ambulatory Visit: Payer: Self-pay | Admitting: *Deleted

## 2023-04-03 DIAGNOSIS — Z79899 Other long term (current) drug therapy: Secondary | ICD-10-CM

## 2023-04-04 ENCOUNTER — Other Ambulatory Visit: Payer: Self-pay | Admitting: *Deleted

## 2023-04-04 DIAGNOSIS — M0579 Rheumatoid arthritis with rheumatoid factor of multiple sites without organ or systems involvement: Secondary | ICD-10-CM

## 2023-04-04 DIAGNOSIS — Z79899 Other long term (current) drug therapy: Secondary | ICD-10-CM

## 2023-04-04 LAB — COMPLETE METABOLIC PANEL WITH GFR
AG Ratio: 1.3 (calc) (ref 1.0–2.5)
ALT: 18 U/L (ref 9–46)
AST: 26 U/L (ref 10–35)
Albumin: 4.6 g/dL (ref 3.6–5.1)
Alkaline phosphatase (APISO): 86 U/L (ref 35–144)
BUN/Creatinine Ratio: 11 (calc) (ref 6–22)
BUN: 15 mg/dL (ref 7–25)
CO2: 28 mmol/L (ref 20–32)
Calcium: 9.9 mg/dL (ref 8.6–10.3)
Chloride: 101 mmol/L (ref 98–110)
Creat: 1.41 mg/dL — ABNORMAL HIGH (ref 0.70–1.35)
Globulin: 3.5 g/dL (ref 1.9–3.7)
Glucose, Bld: 89 mg/dL (ref 65–99)
Potassium: 4.5 mmol/L (ref 3.5–5.3)
Sodium: 138 mmol/L (ref 135–146)
Total Bilirubin: 0.5 mg/dL (ref 0.2–1.2)
Total Protein: 8.1 g/dL (ref 6.1–8.1)
eGFR: 57 mL/min/{1.73_m2} — ABNORMAL LOW (ref >=60)

## 2023-04-04 LAB — CBC WITH DIFFERENTIAL/PLATELET
Absolute Monocytes: 735 {cells}/uL (ref 200–950)
Basophils Absolute: 39 {cells}/uL (ref 0–200)
Basophils Relative: 0.8 %
Eosinophils Absolute: 470 {cells}/uL (ref 15–500)
Eosinophils Relative: 9.6 %
HCT: 44.1 % (ref 38.5–50.0)
Hemoglobin: 14 g/dL (ref 13.2–17.1)
Lymphs Abs: 1764 {cells}/uL (ref 850–3900)
MCH: 25.4 pg — ABNORMAL LOW (ref 27.0–33.0)
MCHC: 31.7 g/dL — ABNORMAL LOW (ref 32.0–36.0)
MCV: 79.9 fL — ABNORMAL LOW (ref 80.0–100.0)
MPV: 14.1 fL — ABNORMAL HIGH (ref 7.5–12.5)
Monocytes Relative: 15 %
Neutro Abs: 1891 {cells}/uL (ref 1500–7800)
Neutrophils Relative %: 38.6 %
Platelets: 163 10*3/uL (ref 140–400)
RBC: 5.52 10*6/uL (ref 4.20–5.80)
RDW: 13.4 % (ref 11.0–15.0)
Total Lymphocyte: 36 %
WBC: 4.9 10*3/uL (ref 3.8–10.8)

## 2023-04-04 NOTE — Progress Notes (Signed)
Ok to recheck BMP with GFR in 2 weeks.

## 2023-04-04 NOTE — Progress Notes (Signed)
Creatinine is elevated-1.41 and GFR is low-57. Rest of CMP WNL.please clarify if he has been taking any NSAIDs? Any other changes in medications?  CBC stable.

## 2023-04-08 ENCOUNTER — Ambulatory Visit (INDEPENDENT_AMBULATORY_CARE_PROVIDER_SITE_OTHER): Payer: Commercial Managed Care - PPO | Admitting: *Deleted

## 2023-04-08 DIAGNOSIS — J309 Allergic rhinitis, unspecified: Secondary | ICD-10-CM | POA: Diagnosis not present

## 2023-04-15 ENCOUNTER — Other Ambulatory Visit: Payer: Self-pay | Admitting: Physician Assistant

## 2023-04-15 ENCOUNTER — Other Ambulatory Visit (HOSPITAL_COMMUNITY): Payer: Self-pay

## 2023-04-15 MED ORDER — OMEPRAZOLE 40 MG PO CPDR
40.0000 mg | DELAYED_RELEASE_CAPSULE | Freq: Every day | ORAL | 0 refills | Status: DC
  Filled 2023-04-15: qty 90, 90d supply, fill #0

## 2023-04-15 NOTE — Telephone Encounter (Signed)
Last Fill: 01/15/2023  Next Visit: 06/19/2023  Last Visit: 01/02/2023  DX: History of gastroesophageal reflux (GERD)   Current Dose per office note on 01/02/2023: omeprazole 40 mg p.o. nightly.   Okay to refill prilosec?

## 2023-04-16 ENCOUNTER — Ambulatory Visit: Payer: Self-pay | Admitting: *Deleted

## 2023-04-16 DIAGNOSIS — J309 Allergic rhinitis, unspecified: Secondary | ICD-10-CM

## 2023-04-17 ENCOUNTER — Other Ambulatory Visit (HOSPITAL_COMMUNITY): Payer: Self-pay

## 2023-04-18 ENCOUNTER — Other Ambulatory Visit (HOSPITAL_COMMUNITY): Payer: Self-pay

## 2023-04-18 ENCOUNTER — Other Ambulatory Visit: Payer: Self-pay | Admitting: Rheumatology

## 2023-04-18 ENCOUNTER — Other Ambulatory Visit: Payer: Self-pay

## 2023-04-18 DIAGNOSIS — M0579 Rheumatoid arthritis with rheumatoid factor of multiple sites without organ or systems involvement: Secondary | ICD-10-CM

## 2023-04-18 MED ORDER — SIMPONI 50 MG/0.5ML ~~LOC~~ SOSY
PREFILLED_SYRINGE | SUBCUTANEOUS | 2 refills | Status: DC
Start: 2023-04-18 — End: 2023-07-11
  Filled 2023-04-18: qty 0.5, 28d supply, fill #0
  Filled 2023-05-15 – 2023-05-23 (×2): qty 0.5, 28d supply, fill #1
  Filled 2023-06-10 – 2023-06-18 (×2): qty 0.5, 28d supply, fill #2

## 2023-04-18 NOTE — Telephone Encounter (Signed)
Last Fill: 01/29/2023  Labs: 04/03/2023 Creatinine is elevated-1.41 and GFR is low-57. Rest of CMP WNL. CBC stable.   TB Gold: 10/09/2022 Neg    Next Visit: 06/19/2023  Last Visit: 01/02/2023  ZO:[id number] arthritis involving multiple sites with positive rheumatoid factor   Current Dose per office note 01/29/2023: Simponi 50 mg subcu every 28 days   Okay to refill Simponi?

## 2023-04-23 ENCOUNTER — Ambulatory Visit (INDEPENDENT_AMBULATORY_CARE_PROVIDER_SITE_OTHER): Payer: Commercial Managed Care - PPO | Admitting: *Deleted

## 2023-04-23 DIAGNOSIS — J309 Allergic rhinitis, unspecified: Secondary | ICD-10-CM | POA: Diagnosis not present

## 2023-04-24 ENCOUNTER — Other Ambulatory Visit: Payer: Self-pay

## 2023-04-25 ENCOUNTER — Other Ambulatory Visit: Payer: Self-pay | Admitting: *Deleted

## 2023-04-25 DIAGNOSIS — Z79899 Other long term (current) drug therapy: Secondary | ICD-10-CM

## 2023-04-25 DIAGNOSIS — M0579 Rheumatoid arthritis with rheumatoid factor of multiple sites without organ or systems involvement: Secondary | ICD-10-CM | POA: Diagnosis not present

## 2023-04-26 ENCOUNTER — Telehealth: Payer: Self-pay

## 2023-04-26 DIAGNOSIS — Z79899 Other long term (current) drug therapy: Secondary | ICD-10-CM

## 2023-04-26 LAB — BASIC METABOLIC PANEL WITH GFR
BUN/Creatinine Ratio: 11 (calc) (ref 6–22)
BUN: 16 mg/dL (ref 7–25)
CO2: 29 mmol/L (ref 20–32)
Calcium: 10.3 mg/dL (ref 8.6–10.3)
Chloride: 101 mmol/L (ref 98–110)
Creat: 1.48 mg/dL — ABNORMAL HIGH (ref 0.70–1.35)
Glucose, Bld: 83 mg/dL (ref 65–99)
Potassium: 4.3 mmol/L (ref 3.5–5.3)
Sodium: 140 mmol/L (ref 135–146)
eGFR: 53 mL/min/{1.73_m2} — ABNORMAL LOW (ref >=60)

## 2023-04-26 NOTE — Telephone Encounter (Signed)
Patient called in stating he had labs re-drawn on 04/25/2023 and wanted to let us know that he had eaten red meat 2 days prior to the lab draw (which he generally does not eat the red meat) and he has also been eating more sweets than usual. Patient states he has not been taking the apple cider vinegar supplement for the past month like he has normally been taking. Patient wanted [place] to be aware of these things in case they could affect the kidney function. Patient would like to know if he should have labs drawn again? Patient states he is due for simponi injection on 05/01/2023. Patient is aware that a call will be returned on Monday, 04/29/2023.

## 2023-04-28 NOTE — Progress Notes (Signed)
Creatinine is elevated at 1.48.  Please forward results to his PCP.  Patient should increase water intake.  We can also refer him to a nephrologist if the patient is in agreement.

## 2023-04-29 NOTE — Telephone Encounter (Signed)
Patient advised Ok to recheck BMP with GFR.  Avoid all NSAIDs and increase water intake prior to lab draw.  If renal function remains abnormal a referral to nephrology can be placed. Patient expressed understanding.

## 2023-04-29 NOTE — Telephone Encounter (Signed)
Ok to recheck BMP with GFR.  Avoid all NSAIDs and increase water intake prior to lab draw.  If renal function remains abnormal a referral to nephrology can be placed

## 2023-05-06 ENCOUNTER — Ambulatory Visit (INDEPENDENT_AMBULATORY_CARE_PROVIDER_SITE_OTHER): Payer: Commercial Managed Care - PPO | Admitting: *Deleted

## 2023-05-06 DIAGNOSIS — J309 Allergic rhinitis, unspecified: Secondary | ICD-10-CM

## 2023-05-08 ENCOUNTER — Telehealth: Payer: Self-pay

## 2023-05-08 NOTE — Telephone Encounter (Signed)
Per Timothy Waters, current Prior Authorization is expiring.  Submitted a Prior Authorization request to [facility] for SIMPONI SQ via CoverMyMeds. Will update once we receive a response.   Key: [id number]

## 2023-05-10 NOTE — Telephone Encounter (Signed)
Received notification from [facility] regarding a prior authorization for SIMPONI SQ PFS. Authorization has been APPROVED from 05/10/2023 to 05/08/2024. Approval letter sent to scan center. Max 28 day supply  Patient must continue to fill through [facility]: [phone]   Authorization # [id number]  [employer] updated  Chesley Mires, PharmD, MPH, BCPS, CPP Clinical Pharmacist (Rheumatology and Pulmonology)

## 2023-05-11 ENCOUNTER — Other Ambulatory Visit (HOSPITAL_COMMUNITY): Payer: Self-pay

## 2023-05-13 ENCOUNTER — Ambulatory Visit (INDEPENDENT_AMBULATORY_CARE_PROVIDER_SITE_OTHER): Payer: Commercial Managed Care - PPO | Admitting: *Deleted

## 2023-05-13 ENCOUNTER — Other Ambulatory Visit (HOSPITAL_COMMUNITY): Payer: Self-pay

## 2023-05-13 DIAGNOSIS — J309 Allergic rhinitis, unspecified: Secondary | ICD-10-CM | POA: Diagnosis not present

## 2023-05-13 MED ORDER — FLUTICASONE PROPIONATE HFA 220 MCG/ACT IN AERO
2.0000 | INHALATION_SPRAY | Freq: Two times a day (BID) | RESPIRATORY_TRACT | 2 refills | Status: DC
  Filled 2023-05-13: qty 12, 30d supply, fill #0
  Filled 2023-07-09: qty 12, 30d supply, fill #1
  Filled 2023-08-05: qty 12, 30d supply, fill #2

## 2023-05-14 ENCOUNTER — Other Ambulatory Visit: Payer: Self-pay

## 2023-05-15 ENCOUNTER — Other Ambulatory Visit: Payer: Self-pay

## 2023-05-15 NOTE — Progress Notes (Signed)
Specialty Pharmacy Refill Coordination Note  Timothy Waters is a 62 y.o. male contacted today regarding refills of specialty medication(s) Golimumab .  Patient requested Delivery  on 05/24/23  to verified address [address]   Medication will be filled on 05/23/23.

## 2023-05-15 NOTE — Progress Notes (Signed)
Specialty Pharmacy Ongoing Clinical Assessment Note  Timothy Waters is a 62 y.o. male who is being followed by the specialty pharmacy service for RxSp Rheumatoid Arthritis   Review of patient's specialty medication(s) Golimumab  occurred today.   Patient has missed 0  doses in the last 4 weeks.   Patient did not have any additional questions or concerns.   Therapeutic benefit summary: Patient is achieving benefit   Adverse events/side effects summary: No adverse events/side effects   Patient's therapy is appropriate to : Continue    Goals      Minimize recurrence of flares     Patient is on track . Patient will maintain adherence         Follow up:  6 months

## 2023-05-21 ENCOUNTER — Other Ambulatory Visit: Payer: Self-pay | Admitting: *Deleted

## 2023-05-21 ENCOUNTER — Telehealth: Payer: Self-pay | Admitting: *Deleted

## 2023-05-21 ENCOUNTER — Ambulatory Visit (INDEPENDENT_AMBULATORY_CARE_PROVIDER_SITE_OTHER): Payer: Commercial Managed Care - PPO | Admitting: *Deleted

## 2023-05-21 DIAGNOSIS — J309 Allergic rhinitis, unspecified: Secondary | ICD-10-CM

## 2023-05-21 DIAGNOSIS — Z79899 Other long term (current) drug therapy: Secondary | ICD-10-CM

## 2023-05-21 NOTE — Telephone Encounter (Signed)
Patient came in to get his allergy injections today and states that he has been having a hoarse throat with throat clearing for the last two weeks. He states it feels like it did before he started allergy injections. He is not coughing up any mucous. He is taking Zyrtec daily and is not using any nasal sprays. He has not been having any issues with  his allergy injections.

## 2023-05-22 LAB — BASIC METABOLIC PANEL WITH GFR
BUN/Creatinine Ratio: 10 (calc) (ref 6–22)
BUN: 13 mg/dL (ref 7–25)
CO2: 27 mmol/L (ref 20–32)
Calcium: 9.5 mg/dL (ref 8.6–10.3)
Chloride: 103 mmol/L (ref 98–110)
Creat: 1.36 mg/dL — ABNORMAL HIGH (ref 0.70–1.35)
Glucose, Bld: 91 mg/dL (ref 65–99)
Potassium: 4.4 mmol/L (ref 3.5–5.3)
Sodium: 139 mmol/L (ref 135–146)
eGFR: 59 mL/min/{1.73_m2} — ABNORMAL LOW (ref >=60)

## 2023-05-23 ENCOUNTER — Other Ambulatory Visit: Payer: Self-pay

## 2023-05-23 ENCOUNTER — Other Ambulatory Visit (HOSPITAL_COMMUNITY): Payer: Self-pay

## 2023-05-28 ENCOUNTER — Ambulatory Visit (INDEPENDENT_AMBULATORY_CARE_PROVIDER_SITE_OTHER): Payer: Commercial Managed Care - PPO | Admitting: *Deleted

## 2023-05-28 DIAGNOSIS — J309 Allergic rhinitis, unspecified: Secondary | ICD-10-CM

## 2023-05-28 NOTE — Telephone Encounter (Signed)
Please make sure he is taking something for reflux.   He could try a nasal saline rinse.  I know flonase has caused a cough for him in the past so will avoid medicated nasal sprays. He should also rest his voice.

## 2023-05-28 NOTE — Telephone Encounter (Signed)
I called and spoke with the patient and he started taking Omeprazole in the morning and Pepcid in the evening. He will add on nasal saline rinses. 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

## 2023-06-04 ENCOUNTER — Ambulatory Visit (INDEPENDENT_AMBULATORY_CARE_PROVIDER_SITE_OTHER): Payer: Commercial Managed Care - PPO | Admitting: *Deleted

## 2023-06-04 DIAGNOSIS — J309 Allergic rhinitis, unspecified: Secondary | ICD-10-CM | POA: Diagnosis not present

## 2023-06-05 NOTE — Progress Notes (Signed)
Office Visit Note  Patient: Timothy Waters             Date of Birth: [birth year]           MRN: [id number]             PCP: Lonie Peak, PA-C Referring: Lonie Peak, PA-C Visit Date: 06/19/2023 Occupation: @GUAROCC @  Subjective:  Medication management  History of Present Illness: Timothy Waters is a 62 y.o. male with seropositive rheumatoid arthritis.  He denies having any flares of rheumatoid arthritis since his last visit in May.  He denies any joint pain or joint swelling today.  He has been taking Simponi 50 mg subcu every 28 days and leflunomide 10 mg p.o. daily on a regular basis without any interruption.  He denies any side effects from the medications.      Activities of Daily Living:  Patient reports morning stiffness for 0 minutes.   Patient Denies nocturnal pain.  Difficulty dressing/grooming: Denies Difficulty climbing stairs: Denies Difficulty getting out of chair: Denies Difficulty using hands for taps, buttons, cutlery, and/or writing: Denies  Review of Systems  Constitutional:  Negative for fatigue.  HENT:  Negative for mouth sores and mouth dryness.   Eyes:  Negative for dryness.  Respiratory:  Negative for shortness of breath.   Cardiovascular:  Negative for chest pain and palpitations.  Gastrointestinal:  Negative for blood in stool, constipation and diarrhea.  Endocrine: Negative for increased urination.  Genitourinary:  Negative for involuntary urination.  Musculoskeletal:  Negative for joint pain, gait problem, joint pain, joint swelling, myalgias, muscle weakness, morning stiffness, muscle tenderness and myalgias.  Skin:  Negative for color change, rash, hair loss and sensitivity to sunlight.  Allergic/Immunologic: Negative for susceptible to infections.  Neurological:  Negative for dizziness and headaches.  Hematological:  Negative for swollen glands.  Psychiatric/Behavioral:  Negative for depressed mood and sleep disturbance. The patient is not  nervous/anxious.     PMFS History:  Patient Active Problem List   Diagnosis Date Noted   Gastroesophageal reflux disease 01/03/2022   Allergic conjunctivitis of both eyes 01/03/2022   Seasonal and perennial allergic rhinitis 01/03/2022   Mild intermittent asthma 01/03/2022   Allergy to sulfa drugs 01/03/2022   Seasonal allergies 01/12/2020   History of gastroesophageal reflux (GERD) 01/01/2017   Rheumatoid arthritis involving multiple sites with positive rheumatoid factor (HCC) 11/08/2016   High risk medication use 11/08/2016   Vitiligo 11/08/2016    Past Medical History:  Diagnosis Date   Allergy    Arthritis    Asthma     Family History  Problem Relation Age of Onset   Diabetes Mother    Cancer Mother    COPD Father    Diabetes Father    Past Surgical History:  Procedure Laterality Date   APPENDECTOMY  11/17/2018   CARPAL TUNNEL RELEASE Left 2002   MANDIBLE SURGERY  1994   SHOULDER ARTHROSCOPY WITH BICEPS TENDON REPAIR Left 03/12/2022   Social History   Social History Narrative   Not on file   Immunization History  Administered Date(s) Administered   Influenza,inj,Quad PF,6+ Mos 06/03/2017, 06/02/2018   Moderna Sars-Covid-2 Vaccination 10/16/2019, 11/18/2019, 05/13/2020, 03/03/2021     Objective: Vital Signs: BP (!) 151/84 (BP Location: Right Arm, Patient Position: Sitting, Cuff Size: Normal)   Pulse 80   Resp 12   Ht 5\' 11"  (1.803 m)   Wt 191 lb (86.6 kg)   BMI 26.64 kg/m    Physical Exam Vitals and  nursing note reviewed.  Constitutional:      Appearance: He is well-developed.  HENT:     Head: Normocephalic and atraumatic.  Eyes:     Conjunctiva/sclera: Conjunctivae normal.     Pupils: Pupils are equal, round, and reactive to light.  Cardiovascular:     Rate and Rhythm: Normal rate and regular rhythm.     Heart sounds: Normal heart sounds.  Pulmonary:     Effort: Pulmonary effort is normal.     Breath sounds: Normal breath sounds.  Abdominal:      General: Bowel sounds are normal.     Palpations: Abdomen is soft.  Musculoskeletal:     Cervical back: Normal range of motion and neck supple.  Skin:    General: Skin is warm and dry.     Capillary Refill: Capillary refill takes less than 2 seconds.     Comments: Several hypopigmented lesions were noted on extremities due to vitiligo.  Neurological:     Mental Status: He is alert and oriented to person, place, and time.  Psychiatric:        Behavior: Behavior normal.      Musculoskeletal Exam: Cervical, thoracic and lumbar spine 1 good range of motion.  Shoulder joints, elbow joints, wrist joints, MCPs PIPs and DIPs were in good range of motion with no synovitis.  Hip joints, knee joints, ankles, MTPs and PIPs been good range of motion with no synovitis.  CDAI Exam: CDAI Score: -- Patient Global: --; Provider Global: -- Swollen: --; Tender: -- Joint Exam 06/19/2023   No joint exam has been documented for this visit   There is currently no information documented on the homunculus. Go to the Rheumatology activity and complete the homunculus joint exam.  Investigation: No additional findings.  Imaging: No results found.  Recent Labs: Lab Results  Component Value Date   WBC 4.9 04/03/2023   HGB 14.0 04/03/2023   PLT 163 04/03/2023   NA 139 05/21/2023   K 4.4 05/21/2023   CL 103 05/21/2023   CO2 27 05/21/2023   GLUCOSE 91 05/21/2023   BUN 13 05/21/2023   CREATININE 1.36 (H) 05/21/2023   BILITOT 0.5 04/03/2023   AST 26 04/03/2023   ALT 18 04/03/2023   PROT 8.1 04/03/2023   CALCIUM 9.5 05/21/2023   GFRAA 68 01/17/2021   QFTBGOLDPLUS NEGATIVE 10/09/2022    Speciality Comments: Prior therapy includes: methotrexate (elevated creatinine), Arava ( rash in the past), Imuran (multiple SE's), and inadequate response to ENBREL, HUMIRA, ORENCIA, XELJANZ and SIMPONI ARIA.  Procedures:  No procedures performed Allergies: Tramadol and Sulfa antibiotics   Assessment /  Plan:     Visit Diagnoses: Rheumatoid arthritis involving multiple sites with positive rheumatoid factor (HCC)-patient denies having a rheumatoid arthritis flare since the last visit.  No synovitis was noted on the examination today.  He has been taking Simponi 50 mg subcu 28 days and leflunomide 10 mg p.o. daily without any interruption.  He denies any side effects from the medications.  High risk medication use - Simponi 50 mg subcu every 28 days and leflunomide 10 mg p.o. daily.  Labs obtained on April 03, 2023 CBC and CMP were stable except creatinine was elevated at 1.41.  Creatinine improved on May 21, 2023.  Increase water intake was discussed.  Patient was advised to get labs in November and every 3 months to monitor for drug toxicity.  Information on immunization was placed in the AVS.  He was advised to hold Simponi  and leflunomide if he develops an infection and resume after the infection resolves.  Annual skin examination to screen for skin cancer was advised.  Use of sunscreen and sun protection was discussed.  Chronic left shoulder pain -improved.  He had left biceps tendon repair in April 2023.  He has been followed by orthopedics .Marland Kitchen  History of gastroesophageal reflux (GERD) - he is on Pepcid 40 mg p.o. every morning and omeprazole 40 mg p.o. nightly.  Elevated blood pressure reading-blood pressure was 158/94.  Patient was advised to monitor blood pressure closely and follow-up with his PCP.  Vitiligo - He was evaluated by Dr. Donzetta Starch.  Hair loss - Has been followed by Dr. Yetta Barre in the past.  Seasonal allergies  Orders: No orders of the defined types were placed in this encounter.  No orders of the defined types were placed in this encounter.    Follow-Up Instructions: Return in about 5 months (around 11/17/2023) for Rheumatoid arthritis.   Pollyann Savoy, MD  Note - This record has been created using Animal nutritionist.  Chart creation errors have been sought, but  may not always  have been located. Such creation errors do not reflect on  the standard of medical care.

## 2023-06-10 ENCOUNTER — Other Ambulatory Visit: Payer: Self-pay

## 2023-06-10 NOTE — Progress Notes (Signed)
Specialty Pharmacy Refill Coordination Note  Timothy Waters is a 62 y.o. male contacted today regarding refills of specialty medication(s) Golimumab   Patient requested Delivery   Delivery date: 06/19/23   Verified address: [address]   Medication will be filled on 06/18/23.

## 2023-06-12 ENCOUNTER — Ambulatory Visit (INDEPENDENT_AMBULATORY_CARE_PROVIDER_SITE_OTHER): Payer: Self-pay | Admitting: *Deleted

## 2023-06-12 DIAGNOSIS — J309 Allergic rhinitis, unspecified: Secondary | ICD-10-CM

## 2023-06-18 ENCOUNTER — Ambulatory Visit (INDEPENDENT_AMBULATORY_CARE_PROVIDER_SITE_OTHER): Payer: Self-pay | Admitting: *Deleted

## 2023-06-18 ENCOUNTER — Other Ambulatory Visit: Payer: Self-pay

## 2023-06-18 ENCOUNTER — Other Ambulatory Visit (HOSPITAL_COMMUNITY): Payer: Self-pay

## 2023-06-18 DIAGNOSIS — J309 Allergic rhinitis, unspecified: Secondary | ICD-10-CM

## 2023-06-19 ENCOUNTER — Other Ambulatory Visit (HOSPITAL_COMMUNITY): Payer: Self-pay

## 2023-06-19 ENCOUNTER — Ambulatory Visit: Payer: Commercial Managed Care - PPO | Attending: Rheumatology | Admitting: Rheumatology

## 2023-06-19 ENCOUNTER — Encounter: Payer: Self-pay | Admitting: Rheumatology

## 2023-06-19 VITALS — BP 151/84 | HR 80 | Resp 12 | Ht 71.0 in | Wt 191.0 lb

## 2023-06-19 DIAGNOSIS — M25512 Pain in left shoulder: Secondary | ICD-10-CM | POA: Diagnosis not present

## 2023-06-19 DIAGNOSIS — J302 Other seasonal allergic rhinitis: Secondary | ICD-10-CM

## 2023-06-19 DIAGNOSIS — Z79899 Other long term (current) drug therapy: Secondary | ICD-10-CM

## 2023-06-19 DIAGNOSIS — M0579 Rheumatoid arthritis with rheumatoid factor of multiple sites without organ or systems involvement: Secondary | ICD-10-CM | POA: Diagnosis not present

## 2023-06-19 DIAGNOSIS — G8929 Other chronic pain: Secondary | ICD-10-CM | POA: Diagnosis not present

## 2023-06-19 DIAGNOSIS — L80 Vitiligo: Secondary | ICD-10-CM | POA: Diagnosis not present

## 2023-06-19 DIAGNOSIS — Z8719 Personal history of other diseases of the digestive system: Secondary | ICD-10-CM | POA: Diagnosis not present

## 2023-06-19 DIAGNOSIS — L659 Nonscarring hair loss, unspecified: Secondary | ICD-10-CM

## 2023-06-19 NOTE — Patient Instructions (Addendum)
Standing Labs We placed an order today for your standing lab work.   Please have your standing labs drawn in November and every 3 months  Please have your labs drawn 2 weeks prior to your appointment so that the provider can discuss your lab results at your appointment, if possible.  Please note that you may see your imaging and lab results in MyChart before we have reviewed them. We will contact you once all results are reviewed. Please allow our office up to 72 hours to thoroughly review all of the results before contacting the office for clarification of your results.  WALK-IN LAB HOURS  Monday through Thursday from 8:00 am -12:30 pm and 1:00 pm-5:00 pm and Friday from 8:00 am-12:00 pm.  Patients with office visits requiring labs will be seen before walk-in labs.  You may encounter longer than normal wait times. Please allow additional time. Wait times may be shorter on  Monday and Thursday afternoons.  We do not book appointments for walk-in labs. We appreciate your patience and understanding with our staff.   Labs are drawn by [facility]. Please bring your co-pay at the time of your lab draw.  You may receive a bill from [facility] for your lab work.  Please note if you are on Hydroxychloroquine and and an order has been placed for a Hydroxychloroquine level,  you will need to have it drawn 4 hours or more after your last dose.  If you wish to have your labs drawn at another location, please call the office 24 hours in advance so we can fax the orders.  The office is located at [address], [place], [place] [address]   If you have any questions regarding directions or hours of operation,  please call [phone].   As a reminder, please drink plenty of water prior to coming for your lab work. Thanks!   Vaccines You are taking a medication(s) that can suppress your immune system.  The following immunizations are recommended: Flu annually Covid-19  RSV Td/Tdap (tetanus,  diphtheria, pertussis) every 10 years Pneumonia (Prevnar 15 then Pneumovax 23 at least 1 year apart.  Alternatively, can take Prevnar 20 without needing additional dose) Shingrix: 2 doses from 4 weeks to 6 months apart  Please check with your PCP to make sure you are up to date.  If you have signs or symptoms of an infection or start antibiotics: First, call your PCP for workup of your infection. Hold your medication through the infection, until you complete your antibiotics, and until symptoms resolve if you take the following: Injectable medication (Actemra, Benlysta, Cimzia, Cosentyx, Enbrel, Humira, Kevzara, Orencia, Remicade, Simponi, Stelara, Taltz, Tremfya) Methotrexate Leflunomide (Arava) Mycophenolate (Cellcept) Osborne [place], or Rinvoq   He is getting annual skin examination to screen for skin cancer.  Please use sunscreen and sun protection.

## 2023-06-25 ENCOUNTER — Ambulatory Visit (INDEPENDENT_AMBULATORY_CARE_PROVIDER_SITE_OTHER): Payer: Self-pay | Admitting: *Deleted

## 2023-06-25 DIAGNOSIS — J309 Allergic rhinitis, unspecified: Secondary | ICD-10-CM

## 2023-06-28 ENCOUNTER — Other Ambulatory Visit: Payer: Self-pay | Admitting: Physician Assistant

## 2023-06-29 ENCOUNTER — Other Ambulatory Visit (HOSPITAL_COMMUNITY): Payer: Self-pay

## 2023-07-01 ENCOUNTER — Other Ambulatory Visit: Payer: Self-pay

## 2023-07-01 ENCOUNTER — Other Ambulatory Visit (HOSPITAL_COMMUNITY): Payer: Self-pay

## 2023-07-01 MED ORDER — LEFLUNOMIDE 10 MG PO TABS
10.0000 mg | ORAL_TABLET | Freq: Every day | ORAL | 0 refills | Status: DC
  Filled 2023-07-01: qty 90, 90d supply, fill #0

## 2023-07-01 NOTE — Telephone Encounter (Signed)
Last Fill: 04/01/2023  Labs: 04/03/2023 Creatinine is elevated-1.41 and GFR is low-57. Rest of CMP WNL. CBC stable.  05/21/2023 Creatinine is borderline elevated-1.36 and GFR is borderline low-59-improving.     Next Visit: 11/19/2023  Last Visit: 06/19/2023  DX: Rheumatoid arthritis involving multiple sites with positive rheumatoid factor   Current Dose per office note 06/19/2023: leflunomide 10 mg p.o. daily   Okay to refill Arava ?

## 2023-07-02 ENCOUNTER — Ambulatory Visit (INDEPENDENT_AMBULATORY_CARE_PROVIDER_SITE_OTHER): Payer: Commercial Managed Care - PPO | Admitting: *Deleted

## 2023-07-02 DIAGNOSIS — J309 Allergic rhinitis, unspecified: Secondary | ICD-10-CM

## 2023-07-08 ENCOUNTER — Other Ambulatory Visit: Payer: Self-pay

## 2023-07-08 ENCOUNTER — Other Ambulatory Visit (HOSPITAL_COMMUNITY): Payer: Self-pay

## 2023-07-08 DIAGNOSIS — R7303 Prediabetes: Secondary | ICD-10-CM | POA: Diagnosis not present

## 2023-07-08 DIAGNOSIS — Z79899 Other long term (current) drug therapy: Secondary | ICD-10-CM | POA: Diagnosis not present

## 2023-07-08 DIAGNOSIS — K219 Gastro-esophageal reflux disease without esophagitis: Secondary | ICD-10-CM | POA: Diagnosis not present

## 2023-07-08 DIAGNOSIS — Z0000 Encounter for general adult medical examination without abnormal findings: Secondary | ICD-10-CM | POA: Diagnosis not present

## 2023-07-08 DIAGNOSIS — Z1331 Encounter for screening for depression: Secondary | ICD-10-CM | POA: Diagnosis not present

## 2023-07-08 DIAGNOSIS — M069 Rheumatoid arthritis, unspecified: Secondary | ICD-10-CM | POA: Diagnosis not present

## 2023-07-08 DIAGNOSIS — Z23 Encounter for immunization: Secondary | ICD-10-CM | POA: Diagnosis not present

## 2023-07-08 DIAGNOSIS — Z125 Encounter for screening for malignant neoplasm of prostate: Secondary | ICD-10-CM | POA: Diagnosis not present

## 2023-07-08 DIAGNOSIS — J45991 Cough variant asthma: Secondary | ICD-10-CM | POA: Diagnosis not present

## 2023-07-08 DIAGNOSIS — E7800 Pure hypercholesterolemia, unspecified: Secondary | ICD-10-CM | POA: Diagnosis not present

## 2023-07-08 MED ORDER — HYDROCORTISONE 2.5 % EX CREA
1.0000 | TOPICAL_CREAM | Freq: Two times a day (BID) | CUTANEOUS | 1 refills | Status: DC
  Filled 2023-07-08: qty 30, 30d supply, fill #0
  Filled 2023-07-09 – 2023-09-09 (×2): qty 30, 30d supply, fill #1

## 2023-07-09 DIAGNOSIS — Z79899 Other long term (current) drug therapy: Secondary | ICD-10-CM | POA: Diagnosis not present

## 2023-07-09 DIAGNOSIS — E7800 Pure hypercholesterolemia, unspecified: Secondary | ICD-10-CM | POA: Diagnosis not present

## 2023-07-09 DIAGNOSIS — Z125 Encounter for screening for malignant neoplasm of prostate: Secondary | ICD-10-CM | POA: Diagnosis not present

## 2023-07-09 DIAGNOSIS — R7303 Prediabetes: Secondary | ICD-10-CM | POA: Diagnosis not present

## 2023-07-10 ENCOUNTER — Ambulatory Visit (INDEPENDENT_AMBULATORY_CARE_PROVIDER_SITE_OTHER): Payer: Self-pay | Admitting: *Deleted

## 2023-07-10 ENCOUNTER — Other Ambulatory Visit: Payer: Self-pay

## 2023-07-10 DIAGNOSIS — J309 Allergic rhinitis, unspecified: Secondary | ICD-10-CM | POA: Diagnosis not present

## 2023-07-11 ENCOUNTER — Other Ambulatory Visit (HOSPITAL_COMMUNITY): Payer: Self-pay

## 2023-07-11 ENCOUNTER — Other Ambulatory Visit: Payer: Self-pay | Admitting: Rheumatology

## 2023-07-11 DIAGNOSIS — M0579 Rheumatoid arthritis with rheumatoid factor of multiple sites without organ or systems involvement: Secondary | ICD-10-CM

## 2023-07-11 DIAGNOSIS — Z111 Encounter for screening for respiratory tuberculosis: Secondary | ICD-10-CM

## 2023-07-11 DIAGNOSIS — Z9225 Personal history of immunosupression therapy: Secondary | ICD-10-CM

## 2023-07-11 MED ORDER — SIMPONI 50 MG/0.5ML ~~LOC~~ SOSY
PREFILLED_SYRINGE | SUBCUTANEOUS | 2 refills | Status: DC
Start: 2023-07-11 — End: 2023-10-09
  Filled 2023-07-11: qty 0.5, 28d supply, fill #0
  Filled 2023-08-09: qty 0.5, 28d supply, fill #1
  Filled 2023-09-10: qty 0.5, 28d supply, fill #2

## 2023-07-11 NOTE — Progress Notes (Signed)
Specialty Pharmacy Refill Coordination Note  Timothy Waters is a 62 y.o. male contacted today regarding refills of specialty medication(s) Golimumab   Patient requested Delivery   Delivery date: 07/17/23   Verified address: [address] [place] [address]   Medication will be filled on 07/16/23. *Pending refill request*

## 2023-07-11 NOTE — Telephone Encounter (Signed)
Last Fill: 04/18/2023  Labs: 04/03/2023 Creatinine is elevated-1.41 and GFR is low-57. Rest of CMP WNL. CBC stable. 05/21/2023 Creatinine is borderline elevated-1.36 and GFR is borderline low-59-improving.     TB Gold: 10/09/2022 Neg    Next Visit: 11/19/2023  Last Visit: 06/19/2023  DX: Rheumatoid arthritis involving multiple sites with positive rheumatoid factor   Current Dose per office note 06/19/2023: Simponi 50 mg subcu every 28 days   Patient advised he is due to update his labs. Patient states he will come Monday to update his labs.   Okay to refill Simponi?

## 2023-07-15 ENCOUNTER — Other Ambulatory Visit: Payer: Self-pay

## 2023-07-15 DIAGNOSIS — Z79899 Other long term (current) drug therapy: Secondary | ICD-10-CM

## 2023-07-16 ENCOUNTER — Other Ambulatory Visit: Payer: Self-pay

## 2023-07-16 ENCOUNTER — Telehealth: Payer: Self-pay | Admitting: *Deleted

## 2023-07-16 DIAGNOSIS — Z79899 Other long term (current) drug therapy: Secondary | ICD-10-CM

## 2023-07-16 LAB — CBC WITH DIFFERENTIAL/PLATELET
Absolute Lymphocytes: 1734 {cells}/uL (ref 850–3900)
Absolute Monocytes: 668 {cells}/uL (ref 200–950)
Basophils Absolute: 51 {cells}/uL (ref 0–200)
Basophils Relative: 1 %
Eosinophils Absolute: 597 {cells}/uL — ABNORMAL HIGH (ref 15–500)
Eosinophils Relative: 11.7 %
HCT: 43.8 % (ref 38.5–50.0)
Hemoglobin: 13.9 g/dL (ref 13.2–17.1)
MCH: 25.8 pg — ABNORMAL LOW (ref 27.0–33.0)
MCHC: 31.7 g/dL — ABNORMAL LOW (ref 32.0–36.0)
MCV: 81.4 fL (ref 80.0–100.0)
Monocytes Relative: 13.1 %
Neutro Abs: 2050 {cells}/uL (ref 1500–7800)
Neutrophils Relative %: 40.2 %
Platelets: 188 10*3/uL (ref 140–400)
RBC: 5.38 10*6/uL (ref 4.20–5.80)
RDW: 13.2 % (ref 11.0–15.0)
Total Lymphocyte: 34 %
WBC: 5.1 10*3/uL (ref 3.8–10.8)

## 2023-07-16 LAB — COMPLETE METABOLIC PANEL WITH GFR
AG Ratio: 1.2 (calc) (ref 1.0–2.5)
ALT: 19 U/L (ref 9–46)
AST: 24 U/L (ref 10–35)
Albumin: 4.4 g/dL (ref 3.6–5.1)
Alkaline phosphatase (APISO): 81 U/L (ref 35–144)
BUN/Creatinine Ratio: 10 (calc) (ref 6–22)
BUN: 14 mg/dL (ref 7–25)
CO2: 26 mmol/L (ref 20–32)
Calcium: 9.5 mg/dL (ref 8.6–10.3)
Chloride: 102 mmol/L (ref 98–110)
Creat: 1.47 mg/dL — ABNORMAL HIGH (ref 0.70–1.35)
Globulin: 3.8 g/dL — ABNORMAL HIGH (ref 1.9–3.7)
Glucose, Bld: 80 mg/dL (ref 65–99)
Potassium: 4.2 mmol/L (ref 3.5–5.3)
Sodium: 137 mmol/L (ref 135–146)
Total Bilirubin: 0.5 mg/dL (ref 0.2–1.2)
Total Protein: 8.2 g/dL — ABNORMAL HIGH (ref 6.1–8.1)
eGFR: 54 mL/min/{1.73_m2} — ABNORMAL LOW (ref >=60)

## 2023-07-16 NOTE — Telephone Encounter (Signed)
-----   Message from Gearldine Bienenstock sent at 07/16/2023  9:40 AM EST ----- Ok to recheck BMP with GFR in 1 month. Please also forward results to PCP.

## 2023-07-16 NOTE — Progress Notes (Signed)
Creatinine remains elevated-1.47 and GFR is low-54.  Please clarify if he is seeing a nephrologist? Avoid the use of NSAIDs. Any other medication changes? Total protein is borderline elevated-globulin is borderline elevated-we will continue to monitor.  CBC stable.

## 2023-07-16 NOTE — Progress Notes (Signed)
Ok to recheck BMP with GFR in 1 month. Please also forward results to PCP.

## 2023-07-17 ENCOUNTER — Ambulatory Visit (INDEPENDENT_AMBULATORY_CARE_PROVIDER_SITE_OTHER): Payer: Commercial Managed Care - PPO

## 2023-07-17 DIAGNOSIS — J309 Allergic rhinitis, unspecified: Secondary | ICD-10-CM | POA: Diagnosis not present

## 2023-07-18 ENCOUNTER — Other Ambulatory Visit: Payer: Self-pay | Admitting: Physician Assistant

## 2023-07-22 ENCOUNTER — Other Ambulatory Visit: Payer: Self-pay

## 2023-07-22 MED ORDER — OMEPRAZOLE 40 MG PO CPDR
40.0000 mg | DELAYED_RELEASE_CAPSULE | Freq: Every day | ORAL | 0 refills | Status: DC
  Filled 2023-07-22: qty 90, 90d supply, fill #0

## 2023-07-22 NOTE — Telephone Encounter (Signed)
Last Fill: 04/15/2023  Next Visit: 11/19/2023  Last Visit: 06/19/2023  Dx: History of gastroesophageal reflux   Current Dose per office note on 06/19/2023: omeprazole 40 mg p.o. nightly.   Okay to refill Omeprazole?

## 2023-07-24 ENCOUNTER — Ambulatory Visit (INDEPENDENT_AMBULATORY_CARE_PROVIDER_SITE_OTHER): Payer: Self-pay

## 2023-07-24 DIAGNOSIS — J309 Allergic rhinitis, unspecified: Secondary | ICD-10-CM | POA: Diagnosis not present

## 2023-07-31 ENCOUNTER — Ambulatory Visit (INDEPENDENT_AMBULATORY_CARE_PROVIDER_SITE_OTHER): Payer: Commercial Managed Care - PPO

## 2023-07-31 DIAGNOSIS — J309 Allergic rhinitis, unspecified: Secondary | ICD-10-CM

## 2023-08-05 ENCOUNTER — Other Ambulatory Visit (HOSPITAL_COMMUNITY): Payer: Self-pay

## 2023-08-06 ENCOUNTER — Other Ambulatory Visit (HOSPITAL_COMMUNITY): Payer: Self-pay

## 2023-08-06 ENCOUNTER — Other Ambulatory Visit: Payer: Self-pay

## 2023-08-06 MED ORDER — FAMOTIDINE 40 MG PO TABS
40.0000 mg | ORAL_TABLET | Freq: Every morning | ORAL | 1 refills | Status: DC
  Filled 2023-08-06: qty 90, 90d supply, fill #0
  Filled 2023-10-30: qty 90, 90d supply, fill #1

## 2023-08-07 ENCOUNTER — Ambulatory Visit (INDEPENDENT_AMBULATORY_CARE_PROVIDER_SITE_OTHER): Payer: Self-pay | Admitting: *Deleted

## 2023-08-07 DIAGNOSIS — J309 Allergic rhinitis, unspecified: Secondary | ICD-10-CM | POA: Diagnosis not present

## 2023-08-09 ENCOUNTER — Other Ambulatory Visit: Payer: Self-pay

## 2023-08-09 NOTE — Progress Notes (Signed)
Specialty Pharmacy Refill Coordination Note  Timothy Waters is a 62 y.o. male contacted today regarding refills of specialty medication(s) Golimumab (Simponi)   Patient requested Delivery   Delivery date: 08/16/23   Verified address: [address]  [place] [address]   Medication will be filled on 08/15/23.

## 2023-08-15 ENCOUNTER — Other Ambulatory Visit: Payer: Self-pay

## 2023-08-19 ENCOUNTER — Ambulatory Visit (INDEPENDENT_AMBULATORY_CARE_PROVIDER_SITE_OTHER): Payer: Self-pay | Admitting: *Deleted

## 2023-08-19 DIAGNOSIS — J309 Allergic rhinitis, unspecified: Secondary | ICD-10-CM

## 2023-08-27 ENCOUNTER — Ambulatory Visit (INDEPENDENT_AMBULATORY_CARE_PROVIDER_SITE_OTHER): Payer: Commercial Managed Care - PPO | Admitting: *Deleted

## 2023-08-27 DIAGNOSIS — J309 Allergic rhinitis, unspecified: Secondary | ICD-10-CM | POA: Diagnosis not present

## 2023-09-03 ENCOUNTER — Ambulatory Visit (INDEPENDENT_AMBULATORY_CARE_PROVIDER_SITE_OTHER): Payer: Commercial Managed Care - PPO | Admitting: *Deleted

## 2023-09-03 DIAGNOSIS — J309 Allergic rhinitis, unspecified: Secondary | ICD-10-CM

## 2023-09-09 ENCOUNTER — Other Ambulatory Visit (HOSPITAL_COMMUNITY): Payer: Self-pay

## 2023-09-09 DIAGNOSIS — J3089 Other allergic rhinitis: Secondary | ICD-10-CM | POA: Diagnosis not present

## 2023-09-09 NOTE — Progress Notes (Signed)
VIALS EXP 09-08-24

## 2023-09-10 ENCOUNTER — Other Ambulatory Visit: Payer: Self-pay

## 2023-09-10 ENCOUNTER — Other Ambulatory Visit (HOSPITAL_COMMUNITY): Payer: Self-pay

## 2023-09-10 ENCOUNTER — Ambulatory Visit (INDEPENDENT_AMBULATORY_CARE_PROVIDER_SITE_OTHER): Payer: Commercial Managed Care - PPO | Admitting: *Deleted

## 2023-09-10 DIAGNOSIS — J309 Allergic rhinitis, unspecified: Secondary | ICD-10-CM

## 2023-09-10 MED ORDER — FLUTICASONE PROPIONATE HFA 220 MCG/ACT IN AERO
2.0000 | INHALATION_SPRAY | Freq: Two times a day (BID) | RESPIRATORY_TRACT | 2 refills | Status: DC
  Filled 2023-09-10: qty 12, 30d supply, fill #0
  Filled 2023-10-14: qty 12, 30d supply, fill #1
  Filled 2023-11-22: qty 12, 30d supply, fill #2

## 2023-09-10 NOTE — Progress Notes (Signed)
Specialty Pharmacy Refill Coordination Note  Timothy Waters is a 63 y.o. male contacted today regarding refills of specialty medication(s) Golimumab (Simponi)   Patient requested Delivery   Delivery date: 09/13/23   Verified address: [address]  [place] [address]   Medication will be filled on 01.23.25.

## 2023-09-12 ENCOUNTER — Other Ambulatory Visit: Payer: Self-pay

## 2023-09-17 ENCOUNTER — Ambulatory Visit (INDEPENDENT_AMBULATORY_CARE_PROVIDER_SITE_OTHER): Payer: Self-pay | Admitting: *Deleted

## 2023-09-17 DIAGNOSIS — J309 Allergic rhinitis, unspecified: Secondary | ICD-10-CM

## 2023-09-27 ENCOUNTER — Other Ambulatory Visit: Payer: Self-pay | Admitting: Rheumatology

## 2023-09-28 ENCOUNTER — Other Ambulatory Visit (HOSPITAL_COMMUNITY): Payer: Self-pay

## 2023-09-29 ENCOUNTER — Other Ambulatory Visit: Payer: Self-pay

## 2023-09-30 ENCOUNTER — Other Ambulatory Visit (HOSPITAL_COMMUNITY): Payer: Self-pay

## 2023-09-30 DIAGNOSIS — D171 Benign lipomatous neoplasm of skin and subcutaneous tissue of trunk: Secondary | ICD-10-CM | POA: Diagnosis not present

## 2023-09-30 MED ORDER — LEFLUNOMIDE 10 MG PO TABS
10.0000 mg | ORAL_TABLET | Freq: Every day | ORAL | 0 refills | Status: DC
  Filled 2023-09-30: qty 90, 90d supply, fill #0

## 2023-09-30 NOTE — Telephone Encounter (Signed)
 Last Fill: 07/01/2023  Labs: 07/15/2023 Creatinine remains elevated-1.47 and GFR is low-54. Total protein is borderline elevated-globulin is borderline elevated-we will continue to monitor. CBC stable.  Next Visit: 11/19/2023  Last Visit: 06/19/2023   DX: Rheumatoid arthritis involving multiple sites with positive rheumatoid factor   Current Dose per office note 06/19/2023: leflunomide  10 mg p.o. daily   Okay to refill Arava  ?

## 2023-10-02 ENCOUNTER — Ambulatory Visit (INDEPENDENT_AMBULATORY_CARE_PROVIDER_SITE_OTHER): Payer: Self-pay

## 2023-10-02 DIAGNOSIS — J309 Allergic rhinitis, unspecified: Secondary | ICD-10-CM

## 2023-10-04 DIAGNOSIS — R7303 Prediabetes: Secondary | ICD-10-CM | POA: Diagnosis not present

## 2023-10-04 DIAGNOSIS — E7800 Pure hypercholesterolemia, unspecified: Secondary | ICD-10-CM | POA: Diagnosis not present

## 2023-10-09 ENCOUNTER — Other Ambulatory Visit: Payer: Self-pay

## 2023-10-09 ENCOUNTER — Other Ambulatory Visit: Payer: Self-pay | Admitting: Rheumatology

## 2023-10-09 DIAGNOSIS — M0579 Rheumatoid arthritis with rheumatoid factor of multiple sites without organ or systems involvement: Secondary | ICD-10-CM

## 2023-10-09 NOTE — Progress Notes (Signed)
Specialty Pharmacy Refill Coordination Note  Timothy Waters is a 63 y.o. male contacted today regarding refills of specialty medication(s) Golimumab (Simponi)   Patient requested Delivery   Delivery date: 10/15/23   Verified address: [address]  [place] [address]   Medication will be filled on 02.24.25.   This fill date is pending response to refill request from provider. Patient is aware and if they have not received fill by intended date they must follow up with pharmacy.

## 2023-10-10 ENCOUNTER — Other Ambulatory Visit (HOSPITAL_COMMUNITY): Payer: Self-pay

## 2023-10-10 MED ORDER — SIMPONI 50 MG/0.5ML ~~LOC~~ SOSY
PREFILLED_SYRINGE | SUBCUTANEOUS | 2 refills | Status: DC
  Filled 2023-10-10 – 2023-10-14 (×2): qty 0.5, 28d supply, fill #0
  Filled 2023-11-05 – 2023-11-07 (×2): qty 0.5, 28d supply, fill #1
  Filled 2023-12-04 – 2023-12-05 (×3): qty 0.5, 28d supply, fill #2

## 2023-10-10 NOTE — Telephone Encounter (Signed)
Last Fill: 07/11/2023  Labs: 07/15/2023 Creatinine remains elevated-1.47 and GFR is low-54. Total protein is borderline elevated-globulin is borderline elevated-we will continue to monitor. CBC stable.  TB Gold: 10/09/2022 Neg    Next Visit: 11/19/2023  Last Visit: 06/19/2023  OZ:HYQMVHQION arthritis involving multiple sites with positive rheumatoid factor   Current Dose per office note 06/19/2023: Simponi 50 mg subcu every 28 days   Patient advised he is due to update his lab work. Patient states he will come Monday to update.   Okay to refill Simponi?

## 2023-10-14 ENCOUNTER — Other Ambulatory Visit: Payer: Self-pay | Admitting: Physician Assistant

## 2023-10-14 ENCOUNTER — Other Ambulatory Visit (HOSPITAL_COMMUNITY): Payer: Self-pay

## 2023-10-14 ENCOUNTER — Other Ambulatory Visit: Payer: Self-pay

## 2023-10-14 ENCOUNTER — Other Ambulatory Visit: Payer: Self-pay | Admitting: *Deleted

## 2023-10-14 DIAGNOSIS — Z111 Encounter for screening for respiratory tuberculosis: Secondary | ICD-10-CM | POA: Diagnosis not present

## 2023-10-14 DIAGNOSIS — Z79899 Other long term (current) drug therapy: Secondary | ICD-10-CM | POA: Diagnosis not present

## 2023-10-14 DIAGNOSIS — Z9225 Personal history of immunosupression therapy: Secondary | ICD-10-CM | POA: Diagnosis not present

## 2023-10-14 MED ORDER — OMEPRAZOLE 40 MG PO CPDR
40.0000 mg | DELAYED_RELEASE_CAPSULE | Freq: Every day | ORAL | 0 refills | Status: DC
  Filled 2023-10-14: qty 90, 90d supply, fill #0

## 2023-10-14 NOTE — Telephone Encounter (Signed)
 Last Fill: 07/22/2023  Next Visit: 11/19/2023  Last Visit: 06/19/2023  Dx: History of gastroesophageal reflux   Current Dose per office note on 06/19/2023: omeprazole 40 mg p.o. nightly   Okay to refill Omeprazole?

## 2023-10-15 ENCOUNTER — Ambulatory Visit (INDEPENDENT_AMBULATORY_CARE_PROVIDER_SITE_OTHER): Payer: Self-pay | Admitting: *Deleted

## 2023-10-15 DIAGNOSIS — J309 Allergic rhinitis, unspecified: Secondary | ICD-10-CM

## 2023-10-15 NOTE — Progress Notes (Signed)
 CBC stable.  CMP WNL-creatinine has improved-1.28 and GFR is low-64.

## 2023-10-16 LAB — CBC WITH DIFFERENTIAL/PLATELET
Absolute Lymphocytes: 1759 {cells}/uL (ref 850–3900)
Absolute Monocytes: 715 {cells}/uL (ref 200–950)
Basophils Absolute: 49 {cells}/uL (ref 0–200)
Basophils Relative: 1 %
Eosinophils Absolute: 191 {cells}/uL (ref 15–500)
Eosinophils Relative: 3.9 %
HCT: 41.9 % (ref 38.5–50.0)
Hemoglobin: 13.3 g/dL (ref 13.2–17.1)
MCH: 25.3 pg — ABNORMAL LOW (ref 27.0–33.0)
MCHC: 31.7 g/dL — ABNORMAL LOW (ref 32.0–36.0)
MCV: 79.7 fL — ABNORMAL LOW (ref 80.0–100.0)
MPV: 13.7 fL — ABNORMAL HIGH (ref 7.5–12.5)
Monocytes Relative: 14.6 %
Neutro Abs: 2185 {cells}/uL (ref 1500–7800)
Neutrophils Relative %: 44.6 %
Platelets: 204 10*3/uL (ref 140–400)
RBC: 5.26 10*6/uL (ref 4.20–5.80)
RDW: 13 % (ref 11.0–15.0)
Total Lymphocyte: 35.9 %
WBC: 4.9 10*3/uL (ref 3.8–10.8)

## 2023-10-16 LAB — QUANTIFERON-TB GOLD PLUS
Mitogen-NIL: >10 [IU]/mL
NIL: 0.04 [IU]/mL
QuantiFERON-TB Gold Plus: NEGATIVE
TB1-NIL: 0.01 [IU]/mL
TB2-NIL: 0.01 [IU]/mL

## 2023-10-16 LAB — COMPLETE METABOLIC PANEL WITH GFR
AG Ratio: 1.2 (calc) (ref 1.0–2.5)
ALT: 14 U/L (ref 9–46)
AST: 19 U/L (ref 10–35)
Albumin: 4.3 g/dL (ref 3.6–5.1)
Alkaline phosphatase (APISO): 72 U/L (ref 35–144)
BUN: 16 mg/dL (ref 7–25)
CO2: 28 mmol/L (ref 20–32)
Calcium: 9.6 mg/dL (ref 8.6–10.3)
Chloride: 101 mmol/L (ref 98–110)
Creat: 1.28 mg/dL (ref 0.70–1.35)
Globulin: 3.7 g/dL (ref 1.9–3.7)
Glucose, Bld: 82 mg/dL (ref 65–99)
Potassium: 4.1 mmol/L (ref 3.5–5.3)
Sodium: 139 mmol/L (ref 135–146)
Total Bilirubin: 0.5 mg/dL (ref 0.2–1.2)
Total Protein: 8 g/dL (ref 6.1–8.1)
eGFR: 64 mL/min/{1.73_m2} (ref >=60)

## 2023-10-16 NOTE — Progress Notes (Signed)
 TB gold negative

## 2023-10-21 DIAGNOSIS — M546 Pain in thoracic spine: Secondary | ICD-10-CM | POA: Diagnosis not present

## 2023-10-21 DIAGNOSIS — M5442 Lumbago with sciatica, left side: Secondary | ICD-10-CM | POA: Diagnosis not present

## 2023-10-21 DIAGNOSIS — M62830 Muscle spasm of back: Secondary | ICD-10-CM | POA: Diagnosis not present

## 2023-10-21 DIAGNOSIS — M51362 Other intervertebral disc degeneration, lumbar region with discogenic back pain and lower extremity pain: Secondary | ICD-10-CM | POA: Diagnosis not present

## 2023-10-23 ENCOUNTER — Ambulatory Visit (INDEPENDENT_AMBULATORY_CARE_PROVIDER_SITE_OTHER): Payer: Self-pay | Admitting: *Deleted

## 2023-10-23 DIAGNOSIS — J309 Allergic rhinitis, unspecified: Secondary | ICD-10-CM

## 2023-10-24 ENCOUNTER — Encounter: Payer: Self-pay | Admitting: Internal Medicine

## 2023-10-25 NOTE — Telephone Encounter (Signed)
 It can be a side effect since we are injecting you with things you are allergic to.  Make sure to take your antihistamines on the days of your allergy injections.  This should improve over time with the injections.

## 2023-10-30 ENCOUNTER — Ambulatory Visit (INDEPENDENT_AMBULATORY_CARE_PROVIDER_SITE_OTHER): Payer: Self-pay

## 2023-10-30 DIAGNOSIS — J309 Allergic rhinitis, unspecified: Secondary | ICD-10-CM

## 2023-10-31 ENCOUNTER — Other Ambulatory Visit (HOSPITAL_COMMUNITY): Payer: Self-pay

## 2023-11-01 NOTE — Progress Notes (Unsigned)
 Office Visit Note  Patient: Timothy Waters             Date of Birth: January 21, 1961           MRN: [id number]             PCP: Lonie Peak, PA-C Referring: Lonie Peak, PA-C Visit Date: 11/07/2023 Occupation: @GUAROCC @  Subjective:  No chief complaint on file.   History of Present Illness: Nayef College is a 63 y.o. male ***     Activities of Daily Living:  Patient reports morning stiffness for 0 minute.   Patient Denies nocturnal pain.  Difficulty dressing/grooming: Denies Difficulty climbing stairs: Denies Difficulty getting out of chair: Denies Difficulty using hands for taps, buttons, cutlery, and/or writing: Denies  Review of Systems  Constitutional:  Negative for fatigue.  HENT:  Negative for mouth sores and mouth dryness.   Eyes:  Negative for dryness.  Respiratory:  Negative for shortness of breath.   Cardiovascular:  Negative for chest pain and palpitations.  Gastrointestinal:  Negative for blood in stool, constipation and diarrhea.  Endocrine: Negative for increased urination.  Genitourinary:  Negative for involuntary urination.  Musculoskeletal:  Negative for joint pain, gait problem, joint pain, joint swelling, myalgias, muscle weakness, morning stiffness, muscle tenderness and myalgias.  Skin:  Negative for color change, rash, hair loss and sensitivity to sunlight.  Allergic/Immunologic: Negative for susceptible to infections.  Neurological:  Negative for dizziness and headaches.  Hematological:  Negative for swollen glands.  Psychiatric/Behavioral:  Negative for depressed mood and sleep disturbance. The patient is not nervous/anxious.     PMFS History:  Patient Active Problem List   Diagnosis Date Noted   Gastroesophageal reflux disease 01/03/2022   Allergic conjunctivitis of both eyes 01/03/2022   Seasonal and perennial allergic rhinitis 01/03/2022   Mild intermittent asthma 01/03/2022   Allergy to sulfa drugs 01/03/2022   Seasonal allergies  01/12/2020   History of gastroesophageal reflux (GERD) 01/01/2017   Rheumatoid arthritis involving multiple sites with positive rheumatoid factor (HCC) 11/08/2016   High risk medication use 11/08/2016   Vitiligo 11/08/2016    Past Medical History:  Diagnosis Date   Allergy    Arthritis    Asthma     Family History  Problem Relation Age of Onset   Diabetes Mother    Cancer Mother    COPD Father    Diabetes Father    Past Surgical History:  Procedure Laterality Date   APPENDECTOMY  11/17/2018   CARPAL TUNNEL RELEASE Left 2002   MANDIBLE SURGERY  1994   SHOULDER ARTHROSCOPY WITH BICEPS TENDON REPAIR Left 03/12/2022   Social History   Social History Narrative   Not on file   Immunization History  Administered Date(s) Administered   Influenza,inj,Quad PF,6+ Mos 06/03/2017, 06/02/2018   Moderna Sars-Covid-2 Vaccination 10/16/2019, 11/18/2019, 05/13/2020, 03/03/2021     Objective: Vital Signs: There were no vitals taken for this visit.   Physical Exam   Musculoskeletal Exam: ***  CDAI Exam: CDAI Score: -- Patient Global: --; Provider Global: -- Swollen: --; Tender: -- Joint Exam 11/07/2023   No joint exam has been documented for this visit   There is currently no information documented on the homunculus. Go to the Rheumatology activity and complete the homunculus joint exam.  Investigation: No additional findings.  Imaging: No results found.  Recent Labs: Lab Results  Component Value Date   WBC 4.9 10/14/2023   HGB 13.3 10/14/2023   PLT 204 10/14/2023   NA  139 10/14/2023   K 4.1 10/14/2023   CL 101 10/14/2023   CO2 28 10/14/2023   GLUCOSE 82 10/14/2023   BUN 16 10/14/2023   CREATININE 1.28 10/14/2023   BILITOT 0.5 10/14/2023   AST 19 10/14/2023   ALT 14 10/14/2023   PROT 8.0 10/14/2023   CALCIUM 9.6 10/14/2023   GFRAA 68 01/17/2021   QFTBGOLDPLUS NEGATIVE 10/14/2023    Speciality Comments: Prior therapy includes: methotrexate (elevated  creatinine), Arava ( rash in the past), Imuran (multiple SE's), and inadequate response to ENBREL, HUMIRA, ORENCIA, XELJANZ and SIMPONI ARIA.  Procedures:  No procedures performed Allergies: Tramadol and Sulfa antibiotics   Assessment / Plan:     Visit Diagnoses: No diagnosis found.  Orders: No orders of the defined types were placed in this encounter.  No orders of the defined types were placed in this encounter.   Face-to-face time spent with patient was *** minutes. Greater than 50% of time was spent in counseling and coordination of care.  Follow-Up Instructions: No follow-ups on file.   Ellen Henri, CMA  Note - This record has been created using Animal nutritionist.  Chart creation errors have been sought, but may not always  have been located. Such creation errors do not reflect on  the standard of medical care.

## 2023-11-04 DIAGNOSIS — D170 Benign lipomatous neoplasm of skin and subcutaneous tissue of head, face and neck: Secondary | ICD-10-CM | POA: Diagnosis not present

## 2023-11-04 DIAGNOSIS — D171 Benign lipomatous neoplasm of skin and subcutaneous tissue of trunk: Secondary | ICD-10-CM | POA: Diagnosis not present

## 2023-11-05 ENCOUNTER — Other Ambulatory Visit: Payer: Self-pay

## 2023-11-05 NOTE — Progress Notes (Signed)
 Specialty Pharmacy Refill Coordination Note  Timothy Waters is a 63 y.o. male contacted today regarding refills of specialty medication(s) Golimumab (Simponi)   Patient requested Delivery   Delivery date: 11/08/23   Verified address: [address]  [place] [address]   Medication will be filled on 11/07/23.

## 2023-11-05 NOTE — Progress Notes (Signed)
 Specialty Pharmacy Ongoing Clinical Assessment Note  Timothy Waters is a 63 y.o. male who is being followed by the specialty pharmacy service for RxSp Rheumatoid Arthritis   Patient's specialty medication(s) reviewed today: Golimumab (Simponi)   Missed doses in the last 4 weeks: 0   Patient/Caregiver did not have any additional questions or concerns.   Therapeutic benefit summary: Patient is achieving benefit   Adverse events/side effects summary: No adverse events/side effects   Patient's therapy is appropriate to: Continue    Goals Addressed             This Visit's Progress    Minimize recurrence of flares   On track    Patient is on track . Patient will maintain adherence         Follow up:  6 months  Otto Herb Specialty Pharmacist

## 2023-11-07 ENCOUNTER — Other Ambulatory Visit: Payer: Self-pay

## 2023-11-07 ENCOUNTER — Other Ambulatory Visit (HOSPITAL_COMMUNITY): Payer: Self-pay

## 2023-11-07 ENCOUNTER — Ambulatory Visit: Payer: Commercial Managed Care - PPO | Attending: Rheumatology | Admitting: Rheumatology

## 2023-11-07 ENCOUNTER — Encounter: Payer: Self-pay | Admitting: Rheumatology

## 2023-11-07 VITALS — BP 151/90 | HR 72 | Resp 14 | Ht 71.0 in | Wt 184.0 lb

## 2023-11-07 DIAGNOSIS — M25512 Pain in left shoulder: Secondary | ICD-10-CM | POA: Diagnosis not present

## 2023-11-07 DIAGNOSIS — L659 Nonscarring hair loss, unspecified: Secondary | ICD-10-CM

## 2023-11-07 DIAGNOSIS — Z79899 Other long term (current) drug therapy: Secondary | ICD-10-CM

## 2023-11-07 DIAGNOSIS — M0579 Rheumatoid arthritis with rheumatoid factor of multiple sites without organ or systems involvement: Secondary | ICD-10-CM

## 2023-11-07 DIAGNOSIS — L80 Vitiligo: Secondary | ICD-10-CM

## 2023-11-07 DIAGNOSIS — Z8719 Personal history of other diseases of the digestive system: Secondary | ICD-10-CM | POA: Diagnosis not present

## 2023-11-07 DIAGNOSIS — J302 Other seasonal allergic rhinitis: Secondary | ICD-10-CM

## 2023-11-07 DIAGNOSIS — G8929 Other chronic pain: Secondary | ICD-10-CM

## 2023-11-07 NOTE — Patient Instructions (Addendum)
 Standing Labs We placed an order today for your standing lab work.   Please have your standing labs drawn in May and every 3 months  Please have your labs drawn 2 weeks prior to your appointment so that the provider can discuss your lab results at your appointment, if possible.  Please note that you may see your imaging and lab results in MyChart before we have reviewed them. We will contact you once all results are reviewed. Please allow our office up to 72 hours to thoroughly review all of the results before contacting the office for clarification of your results.  WALK-IN LAB HOURS  Monday through Thursday from 8:00 am -12:30 pm and 1:00 pm-5:00 pm and Friday from 8:00 am-12:00 pm.  Patients with office visits requiring labs will be seen before walk-in labs.  You may encounter longer than normal wait times. Please allow additional time. Wait times may be shorter on  Monday and Thursday afternoons.  We do not book appointments for walk-in labs. We appreciate your patience and understanding with our staff.   Labs are drawn by [facility]. Please bring your co-pay at the time of your lab draw.  You may receive a bill from [facility] for your lab work.  Please note if you are on Hydroxychloroquine and and an order has been placed for a Hydroxychloroquine level,  you will need to have it drawn 4 hours or more after your last dose.  If you wish to have your labs drawn at another location, please call the office 24 hours in advance so we can fax the orders.  The office is located at [address], [place], [place] [address]   If you have any questions regarding directions or hours of operation,  please call [phone].   As a reminder, please drink plenty of water prior to coming for your lab work. Thanks!  Vaccines You are taking a medication(s) that can suppress your immune system.  The following immunizations are recommended: Flu annually Covid-19  RSV Td/Tdap (tetanus,  diphtheria, pertussis) every 10 years Pneumonia (Prevnar 15 then Pneumovax 23 at least 1 year apart.  Alternatively, can take Prevnar 20 without needing additional dose) Shingrix: 2 doses from 4 weeks to 6 months apart  Please check with your PCP to make sure you are up to date.   If you have signs or symptoms of an infection or start antibiotics: First, call your PCP for workup of your infection. Hold your medication through the infection, until you complete your antibiotics, and until symptoms resolve if you take the following: Injectable medication (Actemra, Benlysta, Cimzia, Cosentyx, Enbrel, Humira, Kevzara, Orencia, Remicade, Simponi, Stelara, Taltz, Tremfya) Methotrexate Leflunomide (Arava) Mycophenolate (Cellcept) Harriette Ohara, Olumiant, or Rinvoq  Please get annual skin examination to screen for skin cancer.  Please use sunscreen and sun protection while you are on Simponi.

## 2023-11-07 NOTE — Progress Notes (Addendum)
 Office Visit Note  Patient: Timothy Waters             Date of Birth: 04-30-1961           MRN: [id number]             PCP: Lonie Peak, PA-C Referring: Lonie Peak, PA-C Visit Date: 11/07/2023  Subjective:  Medication management  History of Present Illness: Timothy Waters is a 63 y.o. male with seropositive rheumatoid arthritis.  He is currently on Simponi 50mg  subcu monthly and leflunomide 10mg  daily. Has been tolerating this medication well.  He reports no joint pain, no swelling, no morning stiffness. He has not had any recent infections. Intentionally has lost 8-10 lbs and has cut out sugars. He feels that this has helped him with his pain. Today, he is wondering if he will need to stop his meds before he gets two lipomas on his back removed in May.   Activities of Daily Living:  Patient reports morning stiffness for 0 minute.   Patient Denies nocturnal pain.  Difficulty dressing/grooming: Denies Difficulty climbing stairs: Denies Difficulty getting out of chair: Denies Difficulty using hands for taps, buttons, cutlery, and/or writing: Denies  Review of Systems  Constitutional:  Negative for fatigue.  HENT:  Negative for mouth sores and mouth dryness.   Eyes:  Negative for dryness.  Respiratory:  Negative for shortness of breath.   Cardiovascular:  Negative for chest pain and palpitations.  Gastrointestinal:  Negative for blood in stool, constipation and diarrhea.  Endocrine: Negative for increased urination.  Genitourinary:  Negative for involuntary urination.  Musculoskeletal:  Negative for joint pain, gait problem, joint pain, joint swelling, myalgias, muscle weakness, morning stiffness, muscle tenderness and myalgias.  Skin:  Negative for color change, rash, hair loss and sensitivity to sunlight.  Allergic/Immunologic: Negative for susceptible to infections.  Neurological:  Negative for dizziness and headaches.  Hematological:  Negative for swollen glands.   Psychiatric/Behavioral:  Negative for depressed mood and sleep disturbance. The patient is not nervous/anxious.     PMFS History:  Patient Active Problem List   Diagnosis Date Noted   Gastroesophageal reflux disease 01/03/2022   Allergic conjunctivitis of both eyes 01/03/2022   Seasonal and perennial allergic rhinitis 01/03/2022   Mild intermittent asthma 01/03/2022   Allergy to sulfa drugs 01/03/2022   Seasonal allergies 01/12/2020   History of gastroesophageal reflux (GERD) 01/01/2017   Rheumatoid arthritis involving multiple sites with positive rheumatoid factor (HCC) 11/08/2016   High risk medication use 11/08/2016   Vitiligo 11/08/2016    Past Medical History:  Diagnosis Date   Allergy    Arthritis    Asthma     Family History  Problem Relation Age of Onset   Diabetes Mother    Cancer Mother    COPD Father    Diabetes Father    Past Surgical History:  Procedure Laterality Date   APPENDECTOMY  11/17/2018   CARPAL TUNNEL RELEASE Left 2002   MANDIBLE SURGERY  1994   SHOULDER ARTHROSCOPY WITH BICEPS TENDON REPAIR Left 03/12/2022   Social History   Social History Narrative   Not on file   Immunization History  Administered Date(s) Administered   Influenza,inj,Quad PF,6+ Mos 06/03/2017, 06/02/2018   Moderna Sars-Covid-2 Vaccination 10/16/2019, 11/18/2019, 05/13/2020, 03/03/2021     Objective: Vital Signs: BP (!) 151/90 (BP Location: Left Arm, Patient Position: Sitting, Cuff Size: Large)   Pulse 72   Resp 14   Ht 5\' 11"  (1.803 m)   Wt  184 lb (83.5 kg)   BMI 25.66 kg/m    Physical Exam Constitutional:      Appearance: He is not ill-appearing.  HENT:     Nose: No congestion.     Mouth/Throat:     Mouth: Mucous membranes are moist.  Eyes:     Conjunctiva/sclera: Conjunctivae normal.  Cardiovascular:     Rate and Rhythm: Normal rate and regular rhythm.     Heart sounds: No murmur heard.    No friction rub. No gallop.  Pulmonary:     Effort: No  respiratory distress.     Breath sounds: No wheezing, rhonchi or rales.  Abdominal:     Palpations: Abdomen is soft.  Musculoskeletal:        General: No swelling, tenderness or deformity.     Cervical back: Neck supple. No rigidity or tenderness.     Right lower leg: No edema.     Left lower leg: No edema.  Lymphadenopathy:     Cervical: No cervical adenopathy.  Skin:    General: Skin is warm and dry.     Findings: No erythema or rash (hypopigmented areas cw known vitiligo). Lesion: hypopigmented areas cw known vitiligo. Neurological:     General: No focal deficit present.     Mental Status: He is alert and oriented to person, place, and time.    Musculoskeletal Exam:   Neck full ROM no tenderness Shoulders full ROM no tenderness or swelling Elbows full ROM no tenderness or swelling Wrists full ROM no tenderness or swelling Fingers full ROM no tenderness, swelling over the MCPs and PIPs No paraspinal tenderness to palpation over upper and lower back Hip normal internal and external rotation without pain, no tenderness to lateral hip palpation Knees full ROM no tenderness or swelling Ankles full ROM no tenderness or swelling there was no tenderness over MTPs.  Investigation: No additional findings.  Imaging: No results found.  Recent Labs: Lab Results  Component Value Date   WBC 4.9 10/14/2023   HGB 13.3 10/14/2023   PLT 204 10/14/2023   NA 139 10/14/2023   K 4.1 10/14/2023   CL 101 10/14/2023   CO2 28 10/14/2023   GLUCOSE 82 10/14/2023   BUN 16 10/14/2023   CREATININE 1.28 10/14/2023   BILITOT 0.5 10/14/2023   AST 19 10/14/2023   ALT 14 10/14/2023   PROT 8.0 10/14/2023   CALCIUM 9.6 10/14/2023   GFRAA 68 01/17/2021   QFTBGOLDPLUS NEGATIVE 10/14/2023   Speciality Comments: Prior therapy includes: methotrexate (elevated creatinine), Arava ( rash in the past), Imuran (multiple SE's), and inadequate response to ENBREL, HUMIRA, ORENCIA, XELJANZ and SIMPONI  ARIA.  Procedures:  No procedures performed Allergies: Tramadol and Sulfa antibiotics   Assessment / Plan:     Visit Diagnoses:  Rheumatoid arthritis involving multiple sites with positive rheumatoid factor (HCC)-patient denies any joint pain or joint swelling.  He has not had any flares since last visit. Currently well controlled on Simponi 50mg  subcutaneous monthly and leflunomide 10mg  daily. Has been tolerating this well. Has had no flares since his last visit. Cr function is better. Stable.  -cw Simponi 50mg  monthly  -cw leflunomide 10mg  daily   High risk medication use - Simponi 50 mg subcu every 28 days and leflunomide 10 mg p.o. daily.  CBC and CMP were normal on February 44 2025.  He was advised to get labs every 3 months to monitor for drug toxicity.  Information on immunization was placed in the AVS.  He was advised to hold Simponi and verify if he develops an infection resume after the infection resolves.  Annual skin examination was advised to monitor for skin cancer while he is on Simponi.  Use of sunscreen and sun protection was discussed. Patient had questions about stopping medication prior to lipoma surgery.  I advised him to take it advised from his dermatologist if he needs to stop Simponi.  If he needs to stop Simponi, he should stop Simponi 1 year month prior to the surgery and may resume 1 to 2 weeks after the surgery if there is no infection and he is clearance by the surgeon.  History of gastroesophageal reflux (GERD) - he is on Pepcid 40 mg p.o. every evening and omeprazole 40 mg p.o. every morning. Encouraged pt to cut back on his omeprazole as this can affect bone density. States that his cough does get worse if he takes only one omeprazole tablet in the AM instead of two. However, he finds that his cough has also been improving after being seen by allergy. He is currently on cetirizine. He will reattempt to go down on the omeprazole again.  Chronic left shoulder pain  -improved he had left biceps tendon repair in April 2023.  He has been followed by orthopedics.  Vitiligo - He was evaluated by Dr. Donzetta Starch.  Hair loss - Has been followed by Dr. Yetta Barre in the past.  Seasonal allergies  Orders: No orders of the defined types were placed in this encounter.  No orders of the defined types were placed in this encounter.  Face-to-face time spent with patient was 30 minutes. Greater than 50% of time was spent in counseling and coordination of care.  Follow-Up Instructions: Return in about 5 months (around 04/08/2024) for Rheumatoid arthritis.   Pollyann Savoy, MD  Pt evaluated with Dr. Corliss Skains  Note - This record has been created using Dragon software.  Chart creation errors have been sought, but may not always  have been located. Such creation errors do not reflect on  the standard of medical care.

## 2023-11-15 ENCOUNTER — Other Ambulatory Visit: Payer: Self-pay

## 2023-11-19 ENCOUNTER — Ambulatory Visit: Payer: Commercial Managed Care - PPO | Admitting: Rheumatology

## 2023-11-19 DIAGNOSIS — M62830 Muscle spasm of back: Secondary | ICD-10-CM | POA: Diagnosis not present

## 2023-11-19 DIAGNOSIS — M5442 Lumbago with sciatica, left side: Secondary | ICD-10-CM | POA: Diagnosis not present

## 2023-11-19 DIAGNOSIS — M546 Pain in thoracic spine: Secondary | ICD-10-CM | POA: Diagnosis not present

## 2023-11-19 DIAGNOSIS — M51362 Other intervertebral disc degeneration, lumbar region with discogenic back pain and lower extremity pain: Secondary | ICD-10-CM | POA: Diagnosis not present

## 2023-11-22 ENCOUNTER — Other Ambulatory Visit (HOSPITAL_COMMUNITY): Payer: Self-pay

## 2023-11-28 ENCOUNTER — Other Ambulatory Visit: Payer: Self-pay

## 2023-12-04 ENCOUNTER — Encounter: Payer: Self-pay | Admitting: Internal Medicine

## 2023-12-04 ENCOUNTER — Other Ambulatory Visit (HOSPITAL_COMMUNITY): Payer: Self-pay

## 2023-12-04 ENCOUNTER — Other Ambulatory Visit: Payer: Self-pay

## 2023-12-04 NOTE — Progress Notes (Signed)
 Specialty Pharmacy Refill Coordination Note  Timothy Waters is a 63 y.o. male contacted today regarding refills of specialty medication(s) Simponi.  Patient requested (Patient-Rptd) Delivery   Delivery date: (Patient-Rptd) 12/09/23   Verified address: (Patient-Rptd) [address] [address]   Medication will be filled on 12/05/23. New delivery date is 12/06/23. Patient has been notified.

## 2023-12-05 ENCOUNTER — Other Ambulatory Visit: Payer: Self-pay

## 2023-12-09 ENCOUNTER — Ambulatory Visit (INDEPENDENT_AMBULATORY_CARE_PROVIDER_SITE_OTHER): Payer: Self-pay

## 2023-12-09 DIAGNOSIS — J309 Allergic rhinitis, unspecified: Secondary | ICD-10-CM

## 2023-12-24 ENCOUNTER — Other Ambulatory Visit (HOSPITAL_COMMUNITY): Payer: Self-pay

## 2023-12-24 ENCOUNTER — Other Ambulatory Visit: Payer: Self-pay

## 2023-12-24 ENCOUNTER — Other Ambulatory Visit: Payer: Self-pay | Admitting: Physician Assistant

## 2023-12-24 MED ORDER — FLUTICASONE PROPIONATE HFA 220 MCG/ACT IN AERO
2.0000 | INHALATION_SPRAY | Freq: Two times a day (BID) | RESPIRATORY_TRACT | 0 refills | Status: DC
  Filled 2023-12-24: qty 12, 30d supply, fill #0

## 2023-12-24 MED ORDER — LEFLUNOMIDE 10 MG PO TABS
10.0000 mg | ORAL_TABLET | Freq: Every day | ORAL | 0 refills | Status: DC
  Filled 2023-12-24: qty 90, 90d supply, fill #0

## 2023-12-24 NOTE — Telephone Encounter (Signed)
 Last Fill: 09/30/2023  Labs: 10/14/2023 CBC stable. CMP WNL-creatinine has improved-1.28 and GFR is low-64.    Next Visit: 04/09/2024  Last Visit: 11/07/2023  DX: Rheumatoid arthritis involving multiple sites with positive rheumatoid factor   Current Dose per office note 11/07/2023: leflunomide  10mg  daily.   Okay to refill Arava  ?

## 2023-12-25 DIAGNOSIS — M546 Pain in thoracic spine: Secondary | ICD-10-CM | POA: Diagnosis not present

## 2023-12-25 DIAGNOSIS — M5442 Lumbago with sciatica, left side: Secondary | ICD-10-CM | POA: Diagnosis not present

## 2023-12-25 DIAGNOSIS — M51362 Other intervertebral disc degeneration, lumbar region with discogenic back pain and lower extremity pain: Secondary | ICD-10-CM | POA: Diagnosis not present

## 2023-12-25 DIAGNOSIS — M62830 Muscle spasm of back: Secondary | ICD-10-CM | POA: Diagnosis not present

## 2023-12-27 ENCOUNTER — Other Ambulatory Visit (HOSPITAL_COMMUNITY): Payer: Self-pay

## 2023-12-27 ENCOUNTER — Other Ambulatory Visit: Payer: Self-pay

## 2023-12-27 ENCOUNTER — Other Ambulatory Visit: Payer: Self-pay | Admitting: Rheumatology

## 2023-12-27 DIAGNOSIS — M0579 Rheumatoid arthritis with rheumatoid factor of multiple sites without organ or systems involvement: Secondary | ICD-10-CM

## 2023-12-27 NOTE — Telephone Encounter (Signed)
 Last Fill: 10/10/2023  Labs: 10/14/2023 CBC stable. CMP WNL-creatinine has improved-1.28 and GFR is low-64.    TB Gold: 10/14/2023 Neg   Next Visit: 04/09/2024  Last Visit: 11/07/2023  DX: Rheumatoid arthritis involving multiple sites with positive rheumatoid factor   Current Dose per office note 11/07/2023: Simponi  50mg  monthly   Okay to refill Simponi ?

## 2023-12-27 NOTE — Progress Notes (Signed)
 Specialty Pharmacy Refill Coordination Note  Timothy Waters is a 63 y.o. male contacted today regarding refills of specialty medication(s) Simponi .  Patient requested (Patient-Rptd) Delivery   Delivery date: (Patient-Rptd) 01/06/24   Verified address: (Patient-Rptd) [address] [place], [place] [address]   Medication will be filled on 01/06/24. New delivery date is 01/07/24. Patient has been notified.

## 2023-12-30 ENCOUNTER — Other Ambulatory Visit: Payer: Self-pay

## 2023-12-30 ENCOUNTER — Other Ambulatory Visit (HOSPITAL_COMMUNITY): Payer: Self-pay

## 2023-12-30 MED ORDER — SIMPONI 50 MG/0.5ML ~~LOC~~ SOSY
PREFILLED_SYRINGE | SUBCUTANEOUS | 2 refills | Status: DC
Start: 2023-12-30 — End: 2024-04-21
  Filled 2023-12-30 – 2024-01-06 (×4): qty 0.5, 28d supply, fill #0
  Filled 2024-02-28: qty 0.5, 28d supply, fill #1
  Filled 2024-03-24 – 2024-03-30 (×2): qty 0.5, 28d supply, fill #2

## 2024-01-06 ENCOUNTER — Other Ambulatory Visit (HOSPITAL_COMMUNITY): Payer: Self-pay

## 2024-01-06 ENCOUNTER — Other Ambulatory Visit: Payer: Self-pay

## 2024-01-06 ENCOUNTER — Ambulatory Visit (INDEPENDENT_AMBULATORY_CARE_PROVIDER_SITE_OTHER): Payer: Self-pay

## 2024-01-06 DIAGNOSIS — J309 Allergic rhinitis, unspecified: Secondary | ICD-10-CM

## 2024-01-07 ENCOUNTER — Other Ambulatory Visit: Payer: Self-pay

## 2024-01-13 ENCOUNTER — Other Ambulatory Visit: Payer: Self-pay | Admitting: Rheumatology

## 2024-01-14 ENCOUNTER — Other Ambulatory Visit (HOSPITAL_COMMUNITY): Payer: Self-pay

## 2024-01-14 ENCOUNTER — Other Ambulatory Visit: Payer: Self-pay

## 2024-01-14 DIAGNOSIS — D171 Benign lipomatous neoplasm of skin and subcutaneous tissue of trunk: Secondary | ICD-10-CM | POA: Diagnosis not present

## 2024-01-14 DIAGNOSIS — D170 Benign lipomatous neoplasm of skin and subcutaneous tissue of head, face and neck: Secondary | ICD-10-CM | POA: Diagnosis not present

## 2024-01-14 MED ORDER — OMEPRAZOLE 40 MG PO CPDR
40.0000 mg | DELAYED_RELEASE_CAPSULE | Freq: Every day | ORAL | 0 refills | Status: DC
  Filled 2024-01-14: qty 90, 90d supply, fill #0

## 2024-01-14 NOTE — Telephone Encounter (Signed)
 Last Fill: 10/14/2023  Next Visit: 04/09/2024  Last Visit: 11/07/2023  Dx: History of gastroesophageal reflux (GERD)   Current Dose per office note on 11/07/2023: omeprazole  40 mg p.o. every morning.   Okay to refill Omeprazole ?

## 2024-01-26 ENCOUNTER — Other Ambulatory Visit (HOSPITAL_COMMUNITY): Payer: Self-pay

## 2024-01-27 ENCOUNTER — Other Ambulatory Visit (HOSPITAL_COMMUNITY): Payer: Self-pay

## 2024-01-27 ENCOUNTER — Other Ambulatory Visit: Payer: Self-pay

## 2024-01-27 MED ORDER — HYDROCORTISONE 2.5 % EX CREA
1.0000 | TOPICAL_CREAM | Freq: Two times a day (BID) | CUTANEOUS | 1 refills | Status: AC
  Filled 2024-01-27: qty 30, 15d supply, fill #0
  Filled 2024-03-01: qty 30, 15d supply, fill #1

## 2024-01-27 MED ORDER — FLUTICASONE PROPIONATE HFA 220 MCG/ACT IN AERO
2.0000 | INHALATION_SPRAY | Freq: Two times a day (BID) | RESPIRATORY_TRACT | 2 refills | Status: DC
  Filled 2024-01-27: qty 12, 30d supply, fill #0
  Filled 2024-03-01: qty 12, 30d supply, fill #1
  Filled 2024-03-28: qty 12, 30d supply, fill #2

## 2024-01-27 MED ORDER — FAMOTIDINE 40 MG PO TABS
40.0000 mg | ORAL_TABLET | Freq: Every morning | ORAL | 1 refills | Status: DC
  Filled 2024-01-27: qty 90, 90d supply, fill #0
  Filled 2024-05-12: qty 90, 90d supply, fill #1

## 2024-02-03 ENCOUNTER — Ambulatory Visit (INDEPENDENT_AMBULATORY_CARE_PROVIDER_SITE_OTHER): Payer: Self-pay

## 2024-02-03 DIAGNOSIS — J309 Allergic rhinitis, unspecified: Secondary | ICD-10-CM

## 2024-02-13 DIAGNOSIS — M51362 Other intervertebral disc degeneration, lumbar region with discogenic back pain and lower extremity pain: Secondary | ICD-10-CM | POA: Diagnosis not present

## 2024-02-13 DIAGNOSIS — M546 Pain in thoracic spine: Secondary | ICD-10-CM | POA: Diagnosis not present

## 2024-02-13 DIAGNOSIS — M5442 Lumbago with sciatica, left side: Secondary | ICD-10-CM | POA: Diagnosis not present

## 2024-02-13 DIAGNOSIS — M62830 Muscle spasm of back: Secondary | ICD-10-CM | POA: Diagnosis not present

## 2024-02-24 DIAGNOSIS — M51362 Other intervertebral disc degeneration, lumbar region with discogenic back pain and lower extremity pain: Secondary | ICD-10-CM | POA: Diagnosis not present

## 2024-02-24 DIAGNOSIS — M546 Pain in thoracic spine: Secondary | ICD-10-CM | POA: Diagnosis not present

## 2024-02-24 DIAGNOSIS — M5442 Lumbago with sciatica, left side: Secondary | ICD-10-CM | POA: Diagnosis not present

## 2024-02-24 DIAGNOSIS — M62830 Muscle spasm of back: Secondary | ICD-10-CM | POA: Diagnosis not present

## 2024-02-28 ENCOUNTER — Other Ambulatory Visit: Payer: Self-pay

## 2024-03-01 ENCOUNTER — Encounter (INDEPENDENT_AMBULATORY_CARE_PROVIDER_SITE_OTHER): Payer: Self-pay

## 2024-03-02 ENCOUNTER — Encounter (HOSPITAL_COMMUNITY): Payer: Self-pay

## 2024-03-02 ENCOUNTER — Ambulatory Visit (INDEPENDENT_AMBULATORY_CARE_PROVIDER_SITE_OTHER)

## 2024-03-02 ENCOUNTER — Other Ambulatory Visit (HOSPITAL_COMMUNITY): Payer: Self-pay

## 2024-03-02 ENCOUNTER — Other Ambulatory Visit: Payer: Self-pay

## 2024-03-02 DIAGNOSIS — J309 Allergic rhinitis, unspecified: Secondary | ICD-10-CM

## 2024-03-02 NOTE — Progress Notes (Signed)
 Specialty Pharmacy Refill Coordination Note  Timothy Waters is a 63 y.o. male contacted today regarding refills of specialty medication(s) Golimumab  (Simponi )   Patient requested Delivery   Delivery date: 03/03/24   Verified address: [address] [place] [address]   Medication will be filled on 07.14.25.

## 2024-03-03 DIAGNOSIS — M546 Pain in thoracic spine: Secondary | ICD-10-CM | POA: Diagnosis not present

## 2024-03-03 DIAGNOSIS — M51362 Other intervertebral disc degeneration, lumbar region with discogenic back pain and lower extremity pain: Secondary | ICD-10-CM | POA: Diagnosis not present

## 2024-03-03 DIAGNOSIS — M5442 Lumbago with sciatica, left side: Secondary | ICD-10-CM | POA: Diagnosis not present

## 2024-03-03 DIAGNOSIS — M62830 Muscle spasm of back: Secondary | ICD-10-CM | POA: Diagnosis not present

## 2024-03-10 DIAGNOSIS — M5442 Lumbago with sciatica, left side: Secondary | ICD-10-CM | POA: Diagnosis not present

## 2024-03-10 DIAGNOSIS — M51362 Other intervertebral disc degeneration, lumbar region with discogenic back pain and lower extremity pain: Secondary | ICD-10-CM | POA: Diagnosis not present

## 2024-03-10 DIAGNOSIS — M62830 Muscle spasm of back: Secondary | ICD-10-CM | POA: Diagnosis not present

## 2024-03-10 DIAGNOSIS — M546 Pain in thoracic spine: Secondary | ICD-10-CM | POA: Diagnosis not present

## 2024-03-16 DIAGNOSIS — M62830 Muscle spasm of back: Secondary | ICD-10-CM | POA: Diagnosis not present

## 2024-03-16 DIAGNOSIS — M51362 Other intervertebral disc degeneration, lumbar region with discogenic back pain and lower extremity pain: Secondary | ICD-10-CM | POA: Diagnosis not present

## 2024-03-16 DIAGNOSIS — M546 Pain in thoracic spine: Secondary | ICD-10-CM | POA: Diagnosis not present

## 2024-03-16 DIAGNOSIS — M5442 Lumbago with sciatica, left side: Secondary | ICD-10-CM | POA: Diagnosis not present

## 2024-03-23 ENCOUNTER — Encounter (INDEPENDENT_AMBULATORY_CARE_PROVIDER_SITE_OTHER): Payer: Self-pay

## 2024-03-23 DIAGNOSIS — M546 Pain in thoracic spine: Secondary | ICD-10-CM | POA: Diagnosis not present

## 2024-03-23 DIAGNOSIS — M5442 Lumbago with sciatica, left side: Secondary | ICD-10-CM | POA: Diagnosis not present

## 2024-03-23 DIAGNOSIS — M62830 Muscle spasm of back: Secondary | ICD-10-CM | POA: Diagnosis not present

## 2024-03-23 DIAGNOSIS — M51362 Other intervertebral disc degeneration, lumbar region with discogenic back pain and lower extremity pain: Secondary | ICD-10-CM | POA: Diagnosis not present

## 2024-03-24 ENCOUNTER — Other Ambulatory Visit: Payer: Self-pay

## 2024-03-24 ENCOUNTER — Other Ambulatory Visit: Payer: Self-pay | Admitting: Rheumatology

## 2024-03-24 ENCOUNTER — Other Ambulatory Visit: Payer: Self-pay | Admitting: Pharmacy Technician

## 2024-03-24 ENCOUNTER — Other Ambulatory Visit (HOSPITAL_COMMUNITY): Payer: Self-pay

## 2024-03-24 MED ORDER — LEFLUNOMIDE 10 MG PO TABS
10.0000 mg | ORAL_TABLET | Freq: Every day | ORAL | 0 refills | Status: DC
  Filled 2024-03-24: qty 30, 30d supply, fill #0

## 2024-03-24 NOTE — Progress Notes (Signed)
 Specialty Pharmacy Refill Coordination Note  Latham Kinzler is a 63 y.o. male contacted today regarding refills of specialty medication(s) Golimumab  (Simponi )   Patient requested (Patient-Rptd) Delivery   Delivery date: 03/26/24 Verified address: (Patient-Rptd) [address] [place] [address]   Medication will be filled on 03/25/24.

## 2024-03-24 NOTE — Telephone Encounter (Signed)
 Last Fill: 12/24/2023  Labs: 10/15/2023 CBC stable.  CMP WNL-creatinine has improved-1.28 and GFR is low-64.   Next Visit: 04/09/2024  Last Visit: 11/07/2023  DX: Rheumatoid arthritis involving multiple sites with positive rheumatoid factor  Current Dose per office note 11/07/2023:leflunomide  10mg  daily.   Okay to refill Arava  ?    Patient can updated labs at upcoming appt. 04/09/2024

## 2024-03-25 ENCOUNTER — Other Ambulatory Visit: Payer: Self-pay

## 2024-03-26 NOTE — Progress Notes (Signed)
 Office Visit Note  Patient: Timothy Waters             Date of Birth: September 06, 1960           MRN: [id number]             PCP: Montey Lot, PA-C Referring: Montey Lot, PA-C Visit Date: 04/09/2024 Occupation: @GUAROCC @  Subjective:  Medication management  History of Present Illness: Timothy Waters is a 63 y.o. male with seropositive rheumatoid arthritis.  He returns today after his last visit in March 2025.  He is on leflunomide  10 mg daily and Simponi  50 mg subcu every month. Last Simponi  injection was on March 30, 2024.  He denies having a flare of rheumatoid arthritis since the last visit.  He had intentional weight loss.  Activities of Daily Living:  Patient reports morning stiffness for 0 none.   Patient Denies nocturnal pain.  Difficulty dressing/grooming: Denies Difficulty climbing stairs: Denies Difficulty getting out of chair: Denies Difficulty using hands for taps, buttons, cutlery, and/or writing: Denies  Review of Systems  Constitutional:  Negative for fatigue.  HENT:  Negative for mouth sores and mouth dryness.   Eyes:  Negative for dryness.  Respiratory:  Negative for shortness of breath.   Cardiovascular:  Negative for chest pain and palpitations.  Gastrointestinal:  Negative for blood in stool, constipation and diarrhea.  Endocrine: Negative for increased urination.  Genitourinary:  Negative for involuntary urination.  Musculoskeletal:  Negative for joint pain, gait problem, joint pain, joint swelling, myalgias, muscle weakness, morning stiffness, muscle tenderness and myalgias.  Skin:  Positive for color change and hair loss. Negative for rash and sensitivity to sunlight.  Allergic/Immunologic: Negative for susceptible to infections.  Neurological:  Negative for dizziness and headaches.  Hematological:  Negative for swollen glands.  Psychiatric/Behavioral:  Negative for depressed mood and sleep disturbance. The patient is not nervous/anxious.     PMFS  History:  Patient Active Problem List   Diagnosis Date Noted   Gastroesophageal reflux disease 01/03/2022   Allergic conjunctivitis of both eyes 01/03/2022   Seasonal and perennial allergic rhinitis 01/03/2022   Mild intermittent asthma 01/03/2022   Allergy  to sulfa drugs 01/03/2022   Seasonal allergies 01/12/2020   History of gastroesophageal reflux (GERD) 01/01/2017   Rheumatoid arthritis involving multiple sites with positive rheumatoid factor (HCC) 11/08/2016   High risk medication use 11/08/2016   Vitiligo 11/08/2016    Past Medical History:  Diagnosis Date   Allergy     Arthritis    Asthma     Family History  Problem Relation Age of Onset   Diabetes Mother    Cancer Mother    COPD Father    Diabetes Father    Past Surgical History:  Procedure Laterality Date   APPENDECTOMY  11/17/2018   CARPAL TUNNEL RELEASE Left 2002   MANDIBLE SURGERY  1994   SHOULDER ARTHROSCOPY WITH BICEPS TENDON REPAIR Left 03/12/2022   Social History   Social History Narrative   Not on file   Immunization History  Administered Date(s) Administered   Influenza,inj,Quad PF,6+ Mos 06/03/2017, 06/02/2018   Moderna Sars-Covid-2 Vaccination 10/16/2019, 11/18/2019, 05/13/2020, 03/03/2021     Objective: Vital Signs: BP 117/80 (BP Location: Left Arm, Patient Position: Sitting, Cuff Size: Normal)   Pulse 80   Resp 15   Ht 5' 11 (1.803 m)   Wt 180 lb 3.2 oz (81.7 kg)   BMI 25.13 kg/m    Physical Exam Vitals and nursing note reviewed.  Constitutional:  Appearance: He is well-developed.  HENT:     Head: Normocephalic and atraumatic.  Eyes:     Conjunctiva/sclera: Conjunctivae normal.     Pupils: Pupils are equal, round, and reactive to light.  Cardiovascular:     Rate and Rhythm: Normal rate and regular rhythm.     Heart sounds: Normal heart sounds.  Pulmonary:     Effort: Pulmonary effort is normal.     Breath sounds: Normal breath sounds.  Abdominal:     General: Bowel sounds  are normal.     Palpations: Abdomen is soft.  Musculoskeletal:     Cervical back: Normal range of motion and neck supple.  Skin:    General: Skin is warm and dry.     Capillary Refill: Capillary refill takes less than 2 seconds.     Comments: vitiligo  Neurological:     Mental Status: He is alert and oriented to person, place, and time.  Psychiatric:        Behavior: Behavior normal.      Musculoskeletal Exam: Cervical, thoracic and lumbar spine Juengel range of motion.  Shoulder joints, elbow joints, wrist joints, MCPs PIPs and DIPs with good range of motion with no synovitis.  Hip joints, knee joints, ankles, MTPs and PIPs with good range of motion with no synovitis.  CDAI Exam: CDAI Score: -- Patient Global: 0 / 100; Provider Global: 0 / 100 Swollen: --; Tender: -- Joint Exam 04/09/2024   No joint exam has been documented for this visit   There is currently no information documented on the homunculus. Go to the Rheumatology activity and complete the homunculus joint exam.  Investigation: No additional findings.  Imaging: No results found.  Recent Labs: Lab Results  Component Value Date   WBC 4.9 10/14/2023   HGB 13.3 10/14/2023   PLT 204 10/14/2023   NA 139 10/14/2023   K 4.1 10/14/2023   CL 101 10/14/2023   CO2 28 10/14/2023   GLUCOSE 82 10/14/2023   BUN 16 10/14/2023   CREATININE 1.28 10/14/2023   BILITOT 0.5 10/14/2023   AST 19 10/14/2023   ALT 14 10/14/2023   PROT 8.0 10/14/2023   CALCIUM 9.6 10/14/2023   GFRAA 68 01/17/2021   QFTBGOLDPLUS NEGATIVE 10/14/2023    Speciality Comments: Prior therapy includes: methotrexate (elevated creatinine), Arava  ( rash in the past), Imuran (multiple SE's), and inadequate response to ENBREL, HUMIRA, ORENCIA, XELJANZ and SIMPONI  ARIA.  Procedures:  No procedures performed Allergies: Tramadol , Other, Sulfa antibiotics, and Sulfur dioxide   Assessment / Plan:     Visit Diagnoses: Rheumatoid arthritis involving  multiple sites with positive rheumatoid factor (HCC)-patient denies having a flare of rheumatoid arthritis since the last visit.  He states that with the dietary modification and weight loss he has been doing much better.  He continues to take Simponi  injections every month and also leflunomide  10 mg p.o. daily.  He denies any interruption in the treatment.  High risk medication use - Simponi  50 mg subcu every 28 days and leflunomide  10 mg p.o. daily.  Labs obtained on October 14, 2023 CBC and CMP were normal and TB Gold was negative.  He has been noncompliant with the lab work.  The need for getting labs to monitor for pathology call, hepatic and renal toxicity were discussed at length.  Will get labs today.  He was advised to get labs every 3 months to monitor for drug toxicity.  Information iMessage was placed in the AVS.  He was  advised to hold Simponi  and leflunomide  if he develops an infection and resume after the infection resolves.  History of gastroesophageal reflux (GERD) - he is on Pepcid  40 mg p.o. every evening and omeprazole  40 mg p.o. every morning.  Chronic left shoulder pain - improved he had left biceps tendon repair in April 2023.  He has been followed by orthopedics.  Vitiligo - He was evaluated by Dr. Bard Molt.  Hair loss - Has been followed by Dr. Molt in the past.  Seasonal allergies  Orders: Orders Placed This Encounter  Procedures   CBC with Differential/Platelet   Comprehensive metabolic panel with GFR   Lipid panel   No orders of the defined types were placed in this encounter.    Follow-Up Instructions: Return in about 5 months (around 09/09/2024) for Rheumatoid arthritis.   Maya Nash, MD  Note - This record has been created using Animal nutritionist.  Chart creation errors have been sought, but may not always  have been located. Such creation errors do not reflect on  the standard of medical care.

## 2024-03-27 ENCOUNTER — Other Ambulatory Visit: Payer: Self-pay

## 2024-03-28 ENCOUNTER — Other Ambulatory Visit (HOSPITAL_COMMUNITY): Payer: Self-pay

## 2024-03-30 ENCOUNTER — Other Ambulatory Visit: Payer: Self-pay

## 2024-03-30 ENCOUNTER — Ambulatory Visit (INDEPENDENT_AMBULATORY_CARE_PROVIDER_SITE_OTHER)

## 2024-03-30 ENCOUNTER — Other Ambulatory Visit (HOSPITAL_COMMUNITY): Payer: Self-pay

## 2024-03-30 DIAGNOSIS — M546 Pain in thoracic spine: Secondary | ICD-10-CM | POA: Diagnosis not present

## 2024-03-30 DIAGNOSIS — J309 Allergic rhinitis, unspecified: Secondary | ICD-10-CM | POA: Diagnosis not present

## 2024-03-30 DIAGNOSIS — M5442 Lumbago with sciatica, left side: Secondary | ICD-10-CM | POA: Diagnosis not present

## 2024-03-30 DIAGNOSIS — M51362 Other intervertebral disc degeneration, lumbar region with discogenic back pain and lower extremity pain: Secondary | ICD-10-CM | POA: Diagnosis not present

## 2024-03-30 DIAGNOSIS — M62830 Muscle spasm of back: Secondary | ICD-10-CM | POA: Diagnosis not present

## 2024-04-06 ENCOUNTER — Other Ambulatory Visit: Payer: Self-pay | Admitting: Physician Assistant

## 2024-04-07 ENCOUNTER — Other Ambulatory Visit: Payer: Self-pay

## 2024-04-07 ENCOUNTER — Other Ambulatory Visit (HOSPITAL_COMMUNITY): Payer: Self-pay

## 2024-04-07 MED ORDER — FOLIC ACID 1 MG PO TABS
2.0000 mg | ORAL_TABLET | Freq: Every day | ORAL | 3 refills | Status: AC
  Filled 2024-04-07: qty 180, 90d supply, fill #0
  Filled 2024-04-17 – 2024-07-06 (×2): qty 180, 90d supply, fill #1

## 2024-04-07 NOTE — Telephone Encounter (Signed)
 Last Fill: 04/01/2023  Next Visit: 04/09/2024  Last Visit: 11/07/2023  DX: Rheumatoid arthritis involving multiple sites with positive rheumatoid factor   Current Dose per office note on 11/07/2023: not mentioned and patient is not taking methotrexate.   I called the patient and he is still taking folic acid  2mg  daily and would like a refill.   Okay to refill folic acid ?

## 2024-04-09 ENCOUNTER — Encounter: Payer: Self-pay | Admitting: Rheumatology

## 2024-04-09 ENCOUNTER — Ambulatory Visit: Attending: Rheumatology | Admitting: Rheumatology

## 2024-04-09 VITALS — BP 117/80 | HR 80 | Resp 15 | Ht 71.0 in | Wt 180.2 lb

## 2024-04-09 DIAGNOSIS — M0579 Rheumatoid arthritis with rheumatoid factor of multiple sites without organ or systems involvement: Secondary | ICD-10-CM

## 2024-04-09 DIAGNOSIS — L80 Vitiligo: Secondary | ICD-10-CM

## 2024-04-09 DIAGNOSIS — G8929 Other chronic pain: Secondary | ICD-10-CM | POA: Diagnosis not present

## 2024-04-09 DIAGNOSIS — L659 Nonscarring hair loss, unspecified: Secondary | ICD-10-CM

## 2024-04-09 DIAGNOSIS — Z8719 Personal history of other diseases of the digestive system: Secondary | ICD-10-CM

## 2024-04-09 DIAGNOSIS — M25512 Pain in left shoulder: Secondary | ICD-10-CM | POA: Diagnosis not present

## 2024-04-09 DIAGNOSIS — J302 Other seasonal allergic rhinitis: Secondary | ICD-10-CM

## 2024-04-09 DIAGNOSIS — E785 Hyperlipidemia, unspecified: Secondary | ICD-10-CM | POA: Diagnosis not present

## 2024-04-09 DIAGNOSIS — Z79899 Other long term (current) drug therapy: Secondary | ICD-10-CM

## 2024-04-09 NOTE — Patient Instructions (Addendum)
 Standing Labs We placed an order today for your standing lab work.   Please have your standing labs drawn in  November and every 3 months  Please have your labs drawn 2 weeks prior to your appointment so that the provider can discuss your lab results at your appointment, if possible.  Please note that you may see your imaging and lab results in MyChart before we have reviewed them. We will contact you once all results are reviewed. Please allow our office up to 72 hours to thoroughly review all of the results before contacting the office for clarification of your results.  WALK-IN LAB HOURS  Monday through Thursday from 8:00 am -12:30 pm and 1:00 pm-4:30 pm and Friday from 8:00 am-12:00 pm.  Patients with office visits requiring labs will be seen before walk-in labs.  You may encounter longer than normal wait times. Please allow additional time. Wait times may be shorter on  Monday and Thursday afternoons.  We do not book appointments for walk-in labs. We appreciate your patience and understanding with our staff.   Labs are drawn by [facility]. Please bring your co-pay at the time of your lab draw.  You may receive a bill from [facility] for your lab work.  Please note if you are on Hydroxychloroquine and and an order has been placed for a Hydroxychloroquine level,  you will need to have it drawn 4 hours or more after your last dose.  If you wish to have your labs drawn at another location, please call the office 24 hours in advance so we can fax the orders.  The office is located at [address], [place], [place] [address]   If you have any questions regarding directions or hours of operation,  please call [phone].   As a reminder, please drink plenty of water prior to coming for your lab work. Thanks!   Vaccines You are taking a medication(s) that can suppress your immune system.  The following immunizations are recommended: Flu annually Covid-19  Td/Tdap (tetanus,  diphtheria, pertussis) every 10 years Pneumonia (Prevnar 15 then Pneumovax 23 at least 1 year apart.  Alternatively, can take Prevnar 20 without needing additional dose) Shingrix: 2 doses from 4 weeks to 6 months apart  Please check with your PCP to make sure you are up to date. If you have signs or symptoms of an infection or start antibiotics: First, call your PCP for workup of your infection. Hold your medication through the infection, until you complete your antibiotics, and until symptoms resolve if you take the following: Injectable medication (Actemra, Benlysta, Cimzia, Cosentyx, Enbrel, Humira, Kevzara, Orencia, Remicade, Simponi , Stelara, Taltz, Tremfya) Methotrexate Leflunomide  (Arava ) Mycophenolate (Cellcept) Earma, Olumiant, or Rinvoq  Please get an annual skin examination to screen for skin cancer.  Please use sunscreen and sun protection.

## 2024-04-10 ENCOUNTER — Ambulatory Visit: Payer: Self-pay | Admitting: Rheumatology

## 2024-04-10 DIAGNOSIS — Z79899 Other long term (current) drug therapy: Secondary | ICD-10-CM

## 2024-04-10 LAB — COMPREHENSIVE METABOLIC PANEL WITH GFR
AG Ratio: 1.4 (calc) (ref 1.0–2.5)
ALT: 17 U/L (ref 9–46)
AST: 21 U/L (ref 10–35)
Albumin: 4.6 g/dL (ref 3.6–5.1)
Alkaline phosphatase (APISO): 81 U/L (ref 35–144)
BUN/Creatinine Ratio: 11 (calc) (ref 6–22)
BUN: 18 mg/dL (ref 7–25)
CO2: 29 mmol/L (ref 20–32)
Calcium: 9.6 mg/dL (ref 8.6–10.3)
Chloride: 102 mmol/L (ref 98–110)
Creat: 1.65 mg/dL — ABNORMAL HIGH (ref 0.70–1.35)
Globulin: 3.3 g/dL (ref 1.9–3.7)
Glucose, Bld: 82 mg/dL (ref 65–99)
Potassium: 4.2 mmol/L (ref 3.5–5.3)
Sodium: 139 mmol/L (ref 135–146)
Total Bilirubin: 0.4 mg/dL (ref 0.2–1.2)
Total Protein: 7.9 g/dL (ref 6.1–8.1)
eGFR: 47 mL/min/1.73m2 — ABNORMAL LOW (ref >=60)

## 2024-04-10 LAB — CBC WITH DIFFERENTIAL/PLATELET
Absolute Lymphocytes: 1670 {cells}/uL (ref 850–3900)
Absolute Monocytes: 850 {cells}/uL (ref 200–950)
Basophils Absolute: 48 {cells}/uL (ref 0–200)
Basophils Relative: 1 %
Eosinophils Absolute: 461 {cells}/uL (ref 15–500)
Eosinophils Relative: 9.6 %
HCT: 45.2 % (ref 38.5–50.0)
Hemoglobin: 14 g/dL (ref 13.2–17.1)
MCH: 25.6 pg — ABNORMAL LOW (ref 27.0–33.0)
MCHC: 31 g/dL — ABNORMAL LOW (ref 32.0–36.0)
MCV: 82.8 fL (ref 80.0–100.0)
MPV: 13.2 fL — ABNORMAL HIGH (ref 7.5–12.5)
Monocytes Relative: 17.7 %
Neutro Abs: 1771 {cells}/uL (ref 1500–7800)
Neutrophils Relative %: 36.9 %
Platelets: 164 Thousand/uL (ref 140–400)
RBC: 5.46 Million/uL (ref 4.20–5.80)
RDW: 14.2 % (ref 11.0–15.0)
Total Lymphocyte: 34.8 %
WBC: 4.8 Thousand/uL (ref 3.8–10.8)

## 2024-04-10 LAB — LIPID PANEL
Cholesterol: 209 mg/dL — ABNORMAL HIGH (ref <200)
HDL: 53 mg/dL (ref >=40)
LDL Cholesterol (Calc): 123 mg/dL — ABNORMAL HIGH
Non-HDL Cholesterol (Calc): 156 mg/dL — ABNORMAL HIGH (ref <130)
Total CHOL/HDL Ratio: 3.9 (calc) (ref <5.0)
Triglycerides: 209 mg/dL — ABNORMAL HIGH (ref <150)

## 2024-04-10 NOTE — Progress Notes (Signed)
 CBC is normal, creatinine is elevated at 1.65 and GFR is low at 47.  Please advise patient to increase water intake and repeat BMP in 1 month.  Triglycerides and LDL are elevated.  Please forward results to his PCP.

## 2024-04-17 ENCOUNTER — Other Ambulatory Visit: Payer: Self-pay | Admitting: Physician Assistant

## 2024-04-17 ENCOUNTER — Other Ambulatory Visit: Payer: Self-pay

## 2024-04-17 ENCOUNTER — Other Ambulatory Visit (HOSPITAL_COMMUNITY): Payer: Self-pay

## 2024-04-17 MED ORDER — OMEPRAZOLE 40 MG PO CPDR
40.0000 mg | DELAYED_RELEASE_CAPSULE | Freq: Every day | ORAL | 0 refills | Status: DC
  Filled 2024-04-17: qty 90, 90d supply, fill #0

## 2024-04-17 MED ORDER — LEFLUNOMIDE 10 MG PO TABS
10.0000 mg | ORAL_TABLET | Freq: Every day | ORAL | 0 refills | Status: DC
  Filled 2024-04-17: qty 90, 90d supply, fill #0

## 2024-04-17 MED ORDER — FLUTICASONE PROPIONATE HFA 220 MCG/ACT IN AERO
2.0000 | INHALATION_SPRAY | Freq: Two times a day (BID) | RESPIRATORY_TRACT | 2 refills | Status: DC
  Filled 2024-04-17 – 2024-04-21 (×2): qty 12, 30d supply, fill #0
  Filled 2024-05-18: qty 12, 30d supply, fill #1
  Filled 2024-06-18: qty 12, 30d supply, fill #2

## 2024-04-17 NOTE — Telephone Encounter (Signed)
 Last Fill: Omeprazole  01/14/2024 Arava  03/24/2024  Labs: 04/09/2024 CBC is normal, creatinine is elevated at 1.65 and GFR is low at 47. Please advise patient to increase water intake and repeat BMP in 1 month. Triglycerides and LDL are elevated. Please forward results to his PCP.   Next Visit: 09/10/2024  Last Visit: 04/09/2024  DX: Rheumatoid arthritis involving multiple sites with positive rheumatoid factor (HCC), History of gastroesophageal reflux (GERD)   Current Dose per office note 04/09/2024: leflunomide  10 mg p.o. daily, omeprazole  40 mg p.o. every morning   Okay to refill Arava   and Omeprazole ?

## 2024-04-21 ENCOUNTER — Encounter (INDEPENDENT_AMBULATORY_CARE_PROVIDER_SITE_OTHER): Payer: Self-pay

## 2024-04-21 ENCOUNTER — Other Ambulatory Visit: Payer: Self-pay

## 2024-04-21 ENCOUNTER — Other Ambulatory Visit: Payer: Self-pay | Admitting: Rheumatology

## 2024-04-21 ENCOUNTER — Other Ambulatory Visit (HOSPITAL_COMMUNITY): Payer: Self-pay

## 2024-04-21 DIAGNOSIS — M0579 Rheumatoid arthritis with rheumatoid factor of multiple sites without organ or systems involvement: Secondary | ICD-10-CM

## 2024-04-21 MED ORDER — SIMPONI 50 MG/0.5ML ~~LOC~~ SOSY
PREFILLED_SYRINGE | SUBCUTANEOUS | 2 refills | Status: DC
  Filled 2024-04-21 – 2024-04-23 (×2): qty 0.5, 28d supply, fill #0
  Filled 2024-05-18 – 2024-05-19 (×3): qty 0.5, 28d supply, fill #1
  Filled 2024-06-12: qty 0.5, 28d supply, fill #2

## 2024-04-21 NOTE — Progress Notes (Signed)
 Specialty Pharmacy Refill Coordination Note  Timothy Waters is a 63 y.o. male contacted today regarding refills of specialty medication(s) Golimumab  (Simponi )   Patient requested Delivery   Delivery date: 04/24/24   Verified address: [address] [place], [address]   Medication will be filled on 04/23/24, pending refill approval.

## 2024-04-21 NOTE — Telephone Encounter (Signed)
 Last Fill: 12/30/2023  Labs: 04/09/2024 CBC is normal, creatinine is elevated at 1.65 and GFR is low at 47.   TB Gold: 10/14/2023 Neg  Next Visit: 09/10/2024  Last Visit: 04/09/2025  IK:Myzlfjunpi arthritis involving multiple sites with positive rheumatoid factor   Current Dose per office note 04/09/2024: Simponi  50 mg subcu every 28 days   Okay to refill Simponi ?

## 2024-04-22 ENCOUNTER — Other Ambulatory Visit: Payer: Self-pay

## 2024-04-23 ENCOUNTER — Other Ambulatory Visit: Payer: Self-pay

## 2024-04-24 ENCOUNTER — Other Ambulatory Visit: Payer: Self-pay

## 2024-04-24 NOTE — Telephone Encounter (Signed)
 ERR

## 2024-04-27 ENCOUNTER — Ambulatory Visit (INDEPENDENT_AMBULATORY_CARE_PROVIDER_SITE_OTHER)

## 2024-04-27 DIAGNOSIS — J309 Allergic rhinitis, unspecified: Secondary | ICD-10-CM | POA: Diagnosis not present

## 2024-04-29 ENCOUNTER — Other Ambulatory Visit: Payer: Self-pay

## 2024-04-30 DIAGNOSIS — M9903 Segmental and somatic dysfunction of lumbar region: Secondary | ICD-10-CM | POA: Diagnosis not present

## 2024-04-30 DIAGNOSIS — M5442 Lumbago with sciatica, left side: Secondary | ICD-10-CM | POA: Diagnosis not present

## 2024-04-30 DIAGNOSIS — M51362 Other intervertebral disc degeneration, lumbar region with discogenic back pain and lower extremity pain: Secondary | ICD-10-CM | POA: Diagnosis not present

## 2024-04-30 DIAGNOSIS — M546 Pain in thoracic spine: Secondary | ICD-10-CM | POA: Diagnosis not present

## 2024-04-30 DIAGNOSIS — M9902 Segmental and somatic dysfunction of thoracic region: Secondary | ICD-10-CM | POA: Diagnosis not present

## 2024-04-30 DIAGNOSIS — M62830 Muscle spasm of back: Secondary | ICD-10-CM | POA: Diagnosis not present

## 2024-05-05 DIAGNOSIS — J301 Allergic rhinitis due to pollen: Secondary | ICD-10-CM | POA: Diagnosis not present

## 2024-05-05 DIAGNOSIS — J302 Other seasonal allergic rhinitis: Secondary | ICD-10-CM | POA: Diagnosis not present

## 2024-05-05 DIAGNOSIS — J3089 Other allergic rhinitis: Secondary | ICD-10-CM | POA: Diagnosis not present

## 2024-05-05 NOTE — Progress Notes (Signed)
 VIALS MADE 05-05-24

## 2024-05-13 ENCOUNTER — Other Ambulatory Visit: Payer: Self-pay

## 2024-05-13 ENCOUNTER — Other Ambulatory Visit (HOSPITAL_COMMUNITY): Payer: Self-pay

## 2024-05-14 ENCOUNTER — Other Ambulatory Visit: Payer: Self-pay

## 2024-05-14 ENCOUNTER — Other Ambulatory Visit: Payer: Self-pay | Admitting: *Deleted

## 2024-05-14 DIAGNOSIS — Z79899 Other long term (current) drug therapy: Secondary | ICD-10-CM

## 2024-05-14 LAB — BASIC METABOLIC PANEL WITH GFR
BUN: 16 mg/dL (ref 7–25)
CO2: 30 mmol/L (ref 20–32)
Calcium: 9.7 mg/dL (ref 8.6–10.3)
Chloride: 103 mmol/L (ref 98–110)
Creat: 1.32 mg/dL (ref 0.70–1.35)
Glucose, Bld: 88 mg/dL (ref 65–99)
Potassium: 4.3 mmol/L (ref 3.5–5.3)
Sodium: 140 mmol/L (ref 135–146)
eGFR: 61 mL/min/1.73m2 (ref >=60)

## 2024-05-15 ENCOUNTER — Ambulatory Visit: Payer: Self-pay | Admitting: Rheumatology

## 2024-05-15 NOTE — Progress Notes (Signed)
Creatinine is normal now.

## 2024-05-18 ENCOUNTER — Telehealth: Payer: Self-pay

## 2024-05-18 ENCOUNTER — Other Ambulatory Visit: Payer: Self-pay

## 2024-05-18 NOTE — Telephone Encounter (Signed)
 Received notification from [facility] pharmacy that patient requires a new authorization for their medication.  Submitted an URGENT Prior Authorization request to [facility] for SIMPONI  SQ via CoverMyMeds. Will update once we receive a response.  Key: [id number]

## 2024-05-19 ENCOUNTER — Other Ambulatory Visit (HOSPITAL_COMMUNITY): Payer: Self-pay

## 2024-05-19 ENCOUNTER — Other Ambulatory Visit: Payer: Self-pay

## 2024-05-19 NOTE — Progress Notes (Signed)
 Patient has been notified that medication delivery is delayed by 1 day due to insufficient inventory.

## 2024-05-19 NOTE — Telephone Encounter (Signed)
 Received notification from [facility] regarding a prior authorization for SIMPONI  SQ. Authorization has been APPROVED from 05/18/2024 to 05/17/2025. Approval letter sent to scan center.  Patient must continue to fill through [facility]: [phone]   Authorization # [id number]  Sherry Pennant, PharmD, MPH, BCPS, CPP Clinical Pharmacist [facility])

## 2024-05-19 NOTE — Progress Notes (Signed)
 PA approved. Please reprocess Simponi  PFS claim

## 2024-05-19 NOTE — Progress Notes (Signed)
 Specialty Pharmacy Refill Coordination Note  Timothy Waters is a 63 y.o. male contacted today regarding refills of specialty medication(s) Golimumab  (Simponi )   Patient requested Delivery   Delivery date: 05/20/24   Verified address: [address] [place]   Medication will be filled on 05/19/24.

## 2024-05-20 ENCOUNTER — Other Ambulatory Visit: Payer: Self-pay

## 2024-05-25 ENCOUNTER — Ambulatory Visit

## 2024-05-25 DIAGNOSIS — J309 Allergic rhinitis, unspecified: Secondary | ICD-10-CM

## 2024-05-26 DIAGNOSIS — M51362 Other intervertebral disc degeneration, lumbar region with discogenic back pain and lower extremity pain: Secondary | ICD-10-CM | POA: Diagnosis not present

## 2024-05-26 DIAGNOSIS — M5442 Lumbago with sciatica, left side: Secondary | ICD-10-CM | POA: Diagnosis not present

## 2024-05-26 DIAGNOSIS — M546 Pain in thoracic spine: Secondary | ICD-10-CM | POA: Diagnosis not present

## 2024-05-26 DIAGNOSIS — M62830 Muscle spasm of back: Secondary | ICD-10-CM | POA: Diagnosis not present

## 2024-05-27 ENCOUNTER — Other Ambulatory Visit: Payer: Self-pay

## 2024-06-10 ENCOUNTER — Other Ambulatory Visit: Payer: Self-pay

## 2024-06-12 ENCOUNTER — Other Ambulatory Visit: Payer: Self-pay

## 2024-06-12 NOTE — Progress Notes (Signed)
 Specialty Pharmacy Refill Coordination Note  Timothy Waters is a 63 y.o. male contacted today regarding refills of specialty medication(s) Golimumab  (Simponi )   Patient requested Delivery   Delivery date: 06/16/24   Verified address: [address] [place]   Medication will be filled on 06/15/24.

## 2024-06-15 ENCOUNTER — Other Ambulatory Visit: Payer: Self-pay

## 2024-06-19 ENCOUNTER — Other Ambulatory Visit: Payer: Self-pay

## 2024-06-19 ENCOUNTER — Encounter: Payer: Self-pay | Admitting: Pharmacist

## 2024-06-19 ENCOUNTER — Other Ambulatory Visit (HOSPITAL_COMMUNITY): Payer: Self-pay

## 2024-06-22 ENCOUNTER — Ambulatory Visit: Admitting: Internal Medicine

## 2024-06-22 ENCOUNTER — Encounter: Payer: Self-pay | Admitting: Internal Medicine

## 2024-06-22 ENCOUNTER — Other Ambulatory Visit (HOSPITAL_BASED_OUTPATIENT_CLINIC_OR_DEPARTMENT_OTHER): Payer: Self-pay

## 2024-06-22 ENCOUNTER — Other Ambulatory Visit: Payer: Self-pay

## 2024-06-22 ENCOUNTER — Other Ambulatory Visit (HOSPITAL_COMMUNITY): Payer: Self-pay

## 2024-06-22 VITALS — BP 120/88 | HR 79 | Temp 98.7°F | Ht 69.75 in | Wt 181.9 lb

## 2024-06-22 DIAGNOSIS — J3089 Other allergic rhinitis: Secondary | ICD-10-CM | POA: Diagnosis not present

## 2024-06-22 DIAGNOSIS — J302 Other seasonal allergic rhinitis: Secondary | ICD-10-CM

## 2024-06-22 DIAGNOSIS — H1013 Acute atopic conjunctivitis, bilateral: Secondary | ICD-10-CM

## 2024-06-22 DIAGNOSIS — K219 Gastro-esophageal reflux disease without esophagitis: Secondary | ICD-10-CM

## 2024-06-22 DIAGNOSIS — J4520 Mild intermittent asthma, uncomplicated: Secondary | ICD-10-CM | POA: Diagnosis not present

## 2024-06-22 DIAGNOSIS — R0989 Other specified symptoms and signs involving the circulatory and respiratory systems: Secondary | ICD-10-CM | POA: Insufficient documentation

## 2024-06-22 MED ORDER — IPRATROPIUM BROMIDE 0.03 % NA SOLN
1.0000 | Freq: Two times a day (BID) | NASAL | 12 refills | Status: AC | PRN
  Filled 2024-06-22: qty 30, 90d supply, fill #0

## 2024-06-22 MED ORDER — AIRSUPRA 90-80 MCG/ACT IN AERO
2.0000 | INHALATION_SPRAY | RESPIRATORY_TRACT | 3 refills | Status: AC | PRN
  Filled 2024-06-22: qty 10.7, 30d supply, fill #0

## 2024-06-22 MED ORDER — EPINEPHRINE 0.3 MG/0.3ML IJ SOAJ
0.3000 mg | INTRAMUSCULAR | 1 refills | Status: AC | PRN
  Filled 2024-06-22: qty 2, 30d supply, fill #0

## 2024-06-22 NOTE — Progress Notes (Signed)
 FOLLOW UP Date of Service/Encounter:   06/22/2024  Subjective:  Timothy Waters (DOB: Apr 07, 1961) is a 63 y.o. male who [facility] in re-evaluation of the following: asthma and alleric rhinitis  History obtained from: chart review and patient.  For Review, LV was on 12/12/22  with Dr.Kirbi Farrugia seen for routine follow-up. See below for summary of history and diagnostics.   Therapeutic plans/changes recommended: we discussed starting aIT. ----------------------------------------------------- Pertinent History/Diagnostics:  Asthma: Adult onset. Chest tightness, wheezing, occasional nighttime cough. 0 prior hospitalization, no OCS use in 2022. UTD on vaccines.  09/26/21: AEC 326, no prior chest imagning available for review. Controlled on Flovent  220.  - spirometry (01/03/22): ratio 108%, 103% FEV1 Allergic Rhinitis:  Congestion, rhinorrhea, eye itching . Perennial with flares in spring and fall. + reflux with hoarseness, on omperazole 40 mg BID. He sings. Unable to use nasal sprays as this triggers an upper airway cough for him.  - SPT environmental panel (01/03/22): positive to tree pollen, outdoor and indoor molds, and dust mites Environmental controls: DM protection. - AIT started 01/24/23: vial 1: M and vial 2: T/DM, reached full maintenance of 0.5 mL red vials 09/17/23 Food Intolerance:  Red meat, pasta, peppers, white rice, potaotes flare up his RA Sulfa allergy :  30+ years ago work up in full body hives which resolved in 24 hours after getting an injection. Avoiding since. Ongoing avoidance recommended --------------------------------------------------- Today presents for follow-up. Discussed the use of AI scribe software for clinical note transcription with the patient, who gave verbal consent to proceed.  History of Present Illness Timothy Waters is a 63 year old male who presents with persistent throat clearing despite allergy   treatment.  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 - Continues to clear throat frequently - no recent ENT follow-up  Allergic rhinitis management - On maintenance allergy  shots for approximately one year - Daily use of Zyrtec; trialed generic cetirizine but perceived it as less effective and returned to Zyrtec one week ago - Uncertain efficacy of allergy  immunotherapy  Gastroesophageal reflux disease (gerd) symptoms and management - No current acid reflux symptoms while on omeprazole  in the morning and famotidine  at night for several years - Attempts to discontinue omeprazole  resulted in recurrence of burning throat sensation - No recent gastroenterology evaluation  Concerns regarding long-term medication use - Concerned about long-term effects of omeprazole , including potential impacts on bone health and gut flora - Daily consumption of mushroom coffee for two years, perceived as beneficial for gut health  Asthma and corticosteroid use - No asthma exacerbations requiring prednisone  in the past year - Rare prednisone  use, limited to joint issues - Dislikes taking prednisone    All medications reviewed by clinical staff and updated in chart. No new pertinent medical or surgical history except as noted in HPI.  ROS: All others negative except as noted per HPI.   Objective:  BP 120/88 (BP Location: Left Arm, Patient Position: Sitting, Cuff Size: Normal)   Pulse 79   Temp 98.7 F (37.1 C) (Temporal)   Ht 5' 9.75 (1.772 m)   Wt 181 lb 14.4 oz (82.5 kg)   SpO2 100%   BMI 26.29 kg/m  Body mass index is 26.29 kg/m. Physical Exam: General Appearance:  Alert, cooperative, no distress, appears  stated age  Head:  Normocephalic, without  obvious abnormality, atraumatic  Eyes:  Conjunctiva clear, EOM's intact  Ears EACs normal bilaterally and normal TMs bilaterally  Nose: Nares normal, normal mucosa and no visible anterior polyps  Throat: Lips, tongue normal; teeth and gums normal, normal posterior oropharynx  Neck: Supple, symmetrical  Lungs:   clear to auscultation bilaterally, Respirations unlabored, no coughing  Heart:  regular rate and rhythm and no murmur, Appears well perfused  Extremities: No edema  Skin: Skin color, texture, turgor normal and no rashes or lesions on visualized portions of skin  Neurologic: No gross deficits   Labs:  Lab Orders  No laboratory test(s) ordered today    Spirometry:  Tracings reviewed. His effort: It was hard to get consistent efforts and there is a question as to whether this reflects a maximal maneuver. FVC: 4.24L FEV1: 2.69L, 87% predicted FEV1/FVC ratio: 0.63 Interpretation: No overt abnormalities noted given today's efforts.  Please see scanned spirometry results for details.  Assessment/Plan   Seasonal and perennial allergic rhinitis-not at goal, on AIT-still throat clearing. Discussed other potential triggers including uncontrolled reflux. - allergen avoidance towards tree pollen, outdoor and indoor molds, and dust mites - Continue over the counter antihistamine daily or daily as needed.   -Your options include Zyrtec (Cetirizine) 10mg , Claritin (Loratadine) 10mg , Allegra (Fexofenadine) 180mg , or Xyzal (Levocetirinze) 5mg  - can use nasal saline spray as needed to help clear mucus and keep nose and upper airway mucosa moistened - add atrovent 1 spray in each nostril twice daily as needed for drainage. Use less often if you become too dry.  Prevention:  - continue allergy  injections per protocol, bring your epinephrine  to all of your injections appointments  Allergic Conjunctivitis: at goal - Consider Allergy  Eye drops: great options include Pataday  (Olopatadine ) or  Zaditor (ketotifen) for eye symptoms daily as needed-both sold over the counter if not covered by insurance.   -Avoid eye drops that say red eye relief as they may contain medications that dry out your eyes.   Asthma-at goal - Controller Inhaler: Continue Flovent  220 2 puffs twice a day; This Should Be Used Everyday - Rinse mouth out after use  During flares:   -  increase Flovent  220 mcg to 3 puffs twice daily for 2 weeks or until symptoms resolve  - take Airsupra  4 puffs every 4 hours while awake for 2-3 days; if still needing multiple times daily after 3 days please schedule a follow-up  - Rescue Inhaler: Airsupra . Use  2-4 puffs as needed for chest tightness, wheezing, or coughing.  Maximum 12 puffs/day.  Can also use 15 minutes prior to exercise if you have symptoms with activity. - Asthma is not controlled if:  - Symptoms are occurring >2 times a week OR  - >2 times a month nighttime awakenings  - You are requiring systemic steroids (prednisone /steroid injections) more than once per year  - Your require hospitalization for your asthma.  - Please call the clinic to schedule a follow up if these symptoms arise  Reflux: not at goal - continue your omeprazole  40 mg in the morning (take 20 minutes before your breakfast) and pepcid  40 mg at night-try to wean off omeprazole  - if not able to decrease omeprazole , referral to GI - lifestyle and diet modifications as below  Follow-up 12 months, sooner if needed It was a pleasure seeing you again in clinic today! Thank you for allowing me to participate in your care.  Rocky Endow, MD  [facility]  Other: allergy  injection given in clinic today  Rocky Endow, MD  [facility] 

## 2024-06-22 NOTE — Patient Instructions (Addendum)
 Seasonal and perennial allergic rhinitis-not at goal, on AIT - allergen avoidance towards tree pollen, outdoor and indoor molds, and dust mites - Continue over the counter antihistamine daily or daily as needed.   -Your options include Zyrtec (Cetirizine) 10mg , Claritin (Loratadine) 10mg , Allegra (Fexofenadine) 180mg , or Xyzal (Levocetirinze) 5mg  - can use nasal saline spray as needed to help clear mucus and keep nose and upper airway mucosa moistened - add atrovent 1 spray in each nostril twice daily as needed for drainage. Use less often if you become too dry.  Prevention:  - continue allergy  injections per protocol, bring your epinephrine  to all of your injections appointments  Allergic Conjunctivitis: at goal - Consider Allergy  Eye drops: great options include Pataday  (Olopatadine ) or Zaditor (ketotifen) for eye symptoms daily as needed-both sold over the counter if not covered by insurance.   -Avoid eye drops that say red eye relief as they may contain medications that dry out your eyes.   Asthma-at goal - Controller Inhaler: Continue Flovent  220 2 puffs twice a day; This Should Be Used Everyday - Rinse mouth out after use  During flares:   -  increase Flovent  220 mcg to 3 puffs twice daily for 2 weeks or until symptoms resolve  - take Airsupra  4 puffs every 4 hours while awake for 2-3 days; if still needing multiple times daily after 3 days please schedule a follow-up  - Rescue Inhaler: Airsupra . Use  2-4 puffs as needed for chest tightness, wheezing, or coughing.  Maximum 12 puffs/day.  Can also use 15 minutes prior to exercise if you have symptoms with activity. - Asthma is not controlled if:  - Symptoms are occurring >2 times a week OR  - >2 times a month nighttime awakenings  - You are requiring systemic steroids (prednisone /steroid injections) more than once per year  - Your require hospitalization for your asthma.  - Please call the clinic to schedule a follow up if these  symptoms arise  Reflux: not at goal - continue your omeprazole  40 mg in the morning (take 20 minutes before your breakfast) and pepcid  40 mg at night-try to wean off omeprazole  - if not able to decrease omeprazole , referral to GI - lifestyle and diet modifications as below  Follow-up 12 months, sooner if needed It was a pleasure seeing you again in clinic today! Thank you for allowing me to participate in your care.  Timothy Endow, MD [facility]   -----------------------------------------------------------------------------------------------------------  CHRONIC THROAT CLEARING-WHAT TO DO!! Causes of chronic throat clearing may include the following (among others):  Acid reflux (lanyngopharyngeal reflux) Allergies (pollens, pet dander, dust mites, etc) Non allergic rhinitis (runny nose, post nasal drainage or congestion NOT caused by allergies-can be secondary to pollutants, cold air, changes in blood vessels to the nose as we age,etc) Environmental irritants (tobacco, smoke, air pollution) Asthma If present for a long period of time, throat clearing can become a habit. We will work with you to treat any of the medical reasons for throat clearing.  Your job is to help prevent the habit, which can cause damage (redness and swelling) to your vocal cords.  It will require a conscious effort on your behalf.  Tips for prevention of throat clearing:  Instead of clearing your throat, swallow instead.   Carrying around water (or something to drink) will help you move the mucus in the right direction.  IF you have the urge to clear your throat, drink your water. If you absolutely have  to clear your throat, use a non-traumatic exercise to do so.   Pant with your mouth open saying "[place], [place], [place]" with a powerful, breathy voice. Increase water intake.  This thins secretions, making them easier to swallow. Chew baking soda gum ([employer]) which can help with swallowing,  reflux, and throat clearing.  Try to chew up to three times daily.   If you experience jaw pain or headaches, decrease the amount of chewing. Suck on sugar free hard candy to help with swallowing. Have your friends and family remind you to swallow when they hear you throat clearing.  As this can be habit forming, sometimes you may not realize you are doing this.  Having someone point it out to you, will help you become more conscious of the behavior. BE PATIENT.  This will take time to resolve, and some do not see improvement until 8-12 weeks into therapy/behavior modifications.   It is important that asthma is controlled when on injections. If it is not, you need to skip your injection and schedule follow-up.

## 2024-06-24 ENCOUNTER — Other Ambulatory Visit (HOSPITAL_COMMUNITY): Payer: Self-pay

## 2024-07-06 ENCOUNTER — Other Ambulatory Visit: Payer: Self-pay | Admitting: Physician Assistant

## 2024-07-06 ENCOUNTER — Other Ambulatory Visit: Payer: Self-pay

## 2024-07-06 DIAGNOSIS — M0579 Rheumatoid arthritis with rheumatoid factor of multiple sites without organ or systems involvement: Secondary | ICD-10-CM

## 2024-07-06 MED ORDER — SIMPONI 50 MG/0.5ML ~~LOC~~ SOSY
PREFILLED_SYRINGE | SUBCUTANEOUS | 2 refills | Status: DC
  Filled 2024-07-06: qty 0.5, fill #0
  Filled 2024-07-08 – 2024-07-09 (×2): qty 0.5, 28d supply, fill #0
  Filled 2024-08-04: qty 0.5, 28d supply, fill #1
  Filled 2024-08-31 – 2024-09-03 (×2): qty 0.5, 28d supply, fill #2

## 2024-07-06 NOTE — Telephone Encounter (Signed)
 Last Fill: 04/21/2024  Labs: 05/14/2024 BMP: Creatinine is normal now.  04/09/2024 CBC is normal, creatinine is elevated at 1.65 and GFR is low at 47. Please advise patient to increase water intake and repeat BMP in 1 month. Triglycerides and LDL are elevated.   TB Gold: 10/14/2023 negative    Next Visit: 09/10/2024  Last Visit: 04/09/2024  DX: Rheumatoid arthritis involving multiple sites with positive rheumatoid factor   Current Dose per office note on 04/09/2024: Simponi  50 mg subcu every 28 days   Okay to refill Simponi ?

## 2024-07-07 ENCOUNTER — Other Ambulatory Visit: Payer: Self-pay

## 2024-07-08 ENCOUNTER — Other Ambulatory Visit (HOSPITAL_COMMUNITY): Payer: Self-pay

## 2024-07-08 ENCOUNTER — Other Ambulatory Visit: Payer: Self-pay

## 2024-07-09 ENCOUNTER — Other Ambulatory Visit: Payer: Self-pay | Admitting: Pharmacy Technician

## 2024-07-09 ENCOUNTER — Other Ambulatory Visit: Payer: Self-pay

## 2024-07-09 DIAGNOSIS — Z79899 Other long term (current) drug therapy: Secondary | ICD-10-CM

## 2024-07-09 LAB — COMPREHENSIVE METABOLIC PANEL WITH GFR
AG Ratio: 1.5 (calc) (ref 1.0–2.5)
ALT: 15 U/L (ref 9–46)
AST: 20 U/L (ref 10–35)
Albumin: 4.5 g/dL (ref 3.6–5.1)
Alkaline phosphatase (APISO): 67 U/L (ref 35–144)
BUN: 19 mg/dL (ref 7–25)
CO2: 28 mmol/L (ref 20–32)
Calcium: 9.6 mg/dL (ref 8.6–10.3)
Chloride: 102 mmol/L (ref 98–110)
Creat: 1.32 mg/dL (ref 0.70–1.35)
Globulin: 3.1 g/dL (ref 1.9–3.7)
Glucose, Bld: 83 mg/dL (ref 65–99)
Potassium: 4.3 mmol/L (ref 3.5–5.3)
Sodium: 139 mmol/L (ref 135–146)
Total Bilirubin: 0.4 mg/dL (ref 0.2–1.2)
Total Protein: 7.6 g/dL (ref 6.1–8.1)
eGFR: 61 mL/min/1.73m2 (ref >=60)

## 2024-07-09 LAB — CBC WITH DIFFERENTIAL/PLATELET
Absolute Lymphocytes: 1683 {cells}/uL (ref 850–3900)
Absolute Monocytes: 818 {cells}/uL (ref 200–950)
Basophils Absolute: 61 {cells}/uL (ref 0–200)
Basophils Relative: 1.3 %
Eosinophils Absolute: 385 {cells}/uL (ref 15–500)
Eosinophils Relative: 8.2 %
HCT: 43 % (ref 38.5–50.0)
Hemoglobin: 13.3 g/dL (ref 13.2–17.1)
MCH: 24.8 pg — ABNORMAL LOW (ref 27.0–33.0)
MCHC: 30.9 g/dL — ABNORMAL LOW (ref 32.0–36.0)
MCV: 80.1 fL (ref 80.0–100.0)
MPV: 13.2 fL — ABNORMAL HIGH (ref 7.5–12.5)
Monocytes Relative: 17.4 %
Neutro Abs: 1753 {cells}/uL (ref 1500–7800)
Neutrophils Relative %: 37.3 %
Platelets: 159 Thousand/uL (ref 140–400)
RBC: 5.37 Million/uL (ref 4.20–5.80)
RDW: 13.1 % (ref 11.0–15.0)
Total Lymphocyte: 35.8 %
WBC: 4.7 Thousand/uL (ref 3.8–10.8)

## 2024-07-09 NOTE — Progress Notes (Signed)
 Specialty Pharmacy Refill Coordination Note  Timothy Waters is a 63 y.o. male contacted today regarding refills of specialty medication(s) Golimumab  (Simponi )   Patient requested Delivery   Delivery date: 07/14/24   Verified address: [address] [place]   Medication will be filled on: 07/13/24

## 2024-07-10 ENCOUNTER — Ambulatory Visit: Payer: Self-pay | Admitting: Rheumatology

## 2024-07-10 NOTE — Addendum Note (Signed)
 Addended by: AZALEA, Tyqwan Pink on: 07/10/2024 05:06 PM   Modules accepted: Orders

## 2024-07-13 ENCOUNTER — Other Ambulatory Visit: Payer: Self-pay

## 2024-07-13 DIAGNOSIS — Z0000 Encounter for general adult medical examination without abnormal findings: Secondary | ICD-10-CM | POA: Diagnosis not present

## 2024-07-13 DIAGNOSIS — J45991 Cough variant asthma: Secondary | ICD-10-CM | POA: Diagnosis not present

## 2024-07-13 DIAGNOSIS — K219 Gastro-esophageal reflux disease without esophagitis: Secondary | ICD-10-CM | POA: Diagnosis not present

## 2024-07-13 DIAGNOSIS — N182 Chronic kidney disease, stage 2 (mild): Secondary | ICD-10-CM | POA: Diagnosis not present

## 2024-07-13 DIAGNOSIS — Z1331 Encounter for screening for depression: Secondary | ICD-10-CM | POA: Diagnosis not present

## 2024-07-13 DIAGNOSIS — Z79899 Other long term (current) drug therapy: Secondary | ICD-10-CM | POA: Diagnosis not present

## 2024-07-13 DIAGNOSIS — E7800 Pure hypercholesterolemia, unspecified: Secondary | ICD-10-CM | POA: Diagnosis not present

## 2024-07-13 DIAGNOSIS — M069 Rheumatoid arthritis, unspecified: Secondary | ICD-10-CM | POA: Diagnosis not present

## 2024-07-13 DIAGNOSIS — R7303 Prediabetes: Secondary | ICD-10-CM | POA: Diagnosis not present

## 2024-07-13 DIAGNOSIS — Z125 Encounter for screening for malignant neoplasm of prostate: Secondary | ICD-10-CM | POA: Diagnosis not present

## 2024-07-20 ENCOUNTER — Other Ambulatory Visit: Payer: Self-pay | Admitting: Rheumatology

## 2024-07-20 ENCOUNTER — Other Ambulatory Visit (HOSPITAL_COMMUNITY): Payer: Self-pay

## 2024-07-21 ENCOUNTER — Other Ambulatory Visit: Payer: Self-pay

## 2024-07-21 ENCOUNTER — Other Ambulatory Visit (HOSPITAL_COMMUNITY): Payer: Self-pay

## 2024-07-21 MED ORDER — OMEPRAZOLE 40 MG PO CPDR
40.0000 mg | DELAYED_RELEASE_CAPSULE | Freq: Every day | ORAL | 0 refills | Status: AC
  Filled 2024-07-21: qty 90, 90d supply, fill #0

## 2024-07-21 MED ORDER — LEFLUNOMIDE 10 MG PO TABS
10.0000 mg | ORAL_TABLET | Freq: Every day | ORAL | 0 refills | Status: AC
  Filled 2024-07-21: qty 90, 90d supply, fill #0

## 2024-07-21 MED ORDER — FLUTICASONE PROPIONATE HFA 220 MCG/ACT IN AERO
2.0000 | INHALATION_SPRAY | Freq: Two times a day (BID) | RESPIRATORY_TRACT | 2 refills | Status: AC
  Filled 2024-07-21: qty 12, 30d supply, fill #0
  Filled 2024-08-29 – 2024-09-08 (×2): qty 12, 30d supply, fill #1

## 2024-07-21 NOTE — Telephone Encounter (Signed)
 Last Fill: 04/17/2024  Labs: 07/09/2024 MCH 24.8, MCHC 30.9, MPV 13.2  Next Visit: 09/10/2024  Last Visit: 04/09/2024  DX: Rheumatoid arthritis involving multiple sites with positive rheumatoid factor, leflunomide  10 mg p.o. daily.   Current Dose per office note on 04/09/2024: leflunomide  10 mg p.o. daily. omeprazole  40 mg p.o. every morning   Okay to refill Arava   and Prilosec?

## 2024-08-02 ENCOUNTER — Encounter (INDEPENDENT_AMBULATORY_CARE_PROVIDER_SITE_OTHER): Payer: Self-pay

## 2024-08-03 ENCOUNTER — Other Ambulatory Visit: Payer: Self-pay

## 2024-08-04 ENCOUNTER — Other Ambulatory Visit: Payer: Self-pay

## 2024-08-04 ENCOUNTER — Other Ambulatory Visit (HOSPITAL_COMMUNITY): Payer: Self-pay

## 2024-08-04 MED ORDER — FAMOTIDINE 40 MG PO TABS
40.0000 mg | ORAL_TABLET | Freq: Every morning | ORAL | 1 refills | Status: AC
  Filled 2024-08-04: qty 90, 90d supply, fill #0

## 2024-08-04 NOTE — Progress Notes (Signed)
 Specialty Pharmacy Refill Coordination Note  Timothy Waters is a 63 y.o. male contacted today regarding refills of specialty medication(s) Golimumab  (Simponi )   Patient requested Delivery   Delivery date: 08/11/24   Verified address: [address] [place]   Medication will be filled on: 08/10/24

## 2024-08-04 NOTE — Progress Notes (Signed)
 Specialty Pharmacy Ongoing Clinical Assessment Note  Timothy Waters is a 63 y.o. male who is being followed by the specialty pharmacy service for RxSp Rheumatoid Arthritis   Patient's specialty medication(s) reviewed today: Golimumab  (Simponi )   Missed doses in the last 4 weeks: 0   Patient/Caregiver did not have any additional questions or concerns.   Therapeutic benefit summary: Patient is achieving benefit   Adverse events/side effects summary: No adverse events/side effects   Patient's therapy is appropriate to: Continue    Goals Addressed             This Visit's Progress    Minimize recurrence of flares   On track    Patient is on track . Patient will maintain adherence         Follow up: 12 months  Ahliya Glatt M Celestino Ackerman Specialty Pharmacist

## 2024-08-21 ENCOUNTER — Other Ambulatory Visit: Payer: Self-pay

## 2024-08-24 DIAGNOSIS — J302 Other seasonal allergic rhinitis: Secondary | ICD-10-CM

## 2024-08-27 NOTE — Progress Notes (Signed)
 "  Office Visit Note  Patient: Timothy Waters             Date of Birth: 12/22/1960           MRN: [id number]             PCP: Timothy Lot, PA-C Referring: Timothy Lot, PA-C Visit Date: 09/10/2024 Occupation: Data Unavailable  Subjective:  Medication management  History of Present Illness: Timothy Waters is a 64 y.o. male with severe positive rheumatoid arthritis.  He returns today after his last visit in August 2025.  He has not had a flare of rheumatoid arthritis.  He continues to take Simponi  50 mg subcu every 28 days and leflunomide  10 mg p.o. daily.  He states about a week ago he tweaked his back and since then he has been having pain in the right side of his lower back which has been going into his right hip.  He seen a chiropractor and had x-rays.  The x-rays were unremarkable.  Activities of Daily Living:  Patient reports morning stiffness for 0 minutes.   Patient Denies nocturnal pain.  Difficulty dressing/grooming: Denies Difficulty climbing stairs: Denies Difficulty getting out of chair: Denies Difficulty using hands for taps, buttons, cutlery, and/or writing: Denies  Review of Systems  Constitutional:  Negative for fatigue.  HENT:  Negative for mouth sores and mouth dryness.   Eyes:  Positive for dryness.  Respiratory:  Negative for shortness of breath.   Cardiovascular:  Negative for chest pain and palpitations.  Gastrointestinal:  Negative for blood in stool, constipation and diarrhea.  Endocrine: Negative for increased urination.  Genitourinary:  Negative for involuntary urination.  Musculoskeletal:  Negative for joint pain, gait problem, joint pain, joint swelling, myalgias, muscle weakness, morning stiffness, muscle tenderness and myalgias.  Skin:  Negative for color change, rash, hair loss and sensitivity to sunlight.  Allergic/Immunologic: Negative for susceptible to infections.  Neurological:  Negative for dizziness and headaches.  Hematological:  Negative  for swollen glands.  Psychiatric/Behavioral:  Negative for depressed mood and sleep disturbance. The patient is not nervous/anxious.     PMFS History:  Patient Active Problem List   Diagnosis Date Noted   Chronic throat clearing 06/22/2024   Gastroesophageal reflux disease 01/03/2022   Allergic conjunctivitis of both eyes 01/03/2022   Seasonal and perennial allergic rhinitis 01/03/2022   Mild intermittent asthma 01/03/2022   Allergy  to sulfa drugs 01/03/2022   Seasonal allergies 01/12/2020   History of gastroesophageal reflux (GERD) 01/01/2017   Rheumatoid arthritis involving multiple sites with positive rheumatoid factor (HCC) 11/08/2016   High risk medication use 11/08/2016   Vitiligo 11/08/2016    Past Medical History:  Diagnosis Date   Allergy     Arthritis    Asthma     Family History  Problem Relation Age of Onset   Diabetes Mother    Cancer Mother    COPD Father    Diabetes Father    Past Surgical History:  Procedure Laterality Date   APPENDECTOMY  11/17/2018   CARPAL TUNNEL RELEASE Left 2002   MANDIBLE SURGERY  1994   SHOULDER ARTHROSCOPY WITH BICEPS TENDON REPAIR Left 03/12/2022   Social History[1] Social History   Social History Narrative   Not on file     Immunization History  Administered Date(s) Administered   Influenza,inj,Quad PF,6+ Mos 06/03/2017, 06/02/2018   Moderna Sars-Covid-2 Vaccination 10/16/2019, 11/18/2019, 05/13/2020, 03/03/2021     Objective: Vital Signs: BP (!) 161/82   Pulse 72   Temp  98.2 F (36.8 C)   Resp 17   Ht 5' 10 (1.778 m)   Wt 185 lb 3.2 oz (84 kg)   BMI 26.57 kg/m    Physical Exam Vitals and nursing note reviewed.  Constitutional:      Appearance: He is well-developed.  HENT:     Head: Normocephalic and atraumatic.  Eyes:     Conjunctiva/sclera: Conjunctivae normal.     Pupils: Pupils are equal, round, and reactive to light.  Cardiovascular:     Rate and Rhythm: Normal rate and regular rhythm.     Heart  sounds: Normal heart sounds.  Pulmonary:     Effort: Pulmonary effort is normal.     Breath sounds: Normal breath sounds.  Abdominal:     General: Bowel sounds are normal.     Palpations: Abdomen is soft.  Musculoskeletal:     Cervical back: Normal range of motion and neck supple.  Skin:    General: Skin is warm and dry.     Capillary Refill: Capillary refill takes less than 2 seconds.  Neurological:     Mental Status: He is alert and oriented to person, place, and time.  Psychiatric:        Behavior: Behavior normal.      Musculoskeletal Exam: Cervical, thoracic and lumbar spine were in good range of motion.  There was no SI joint tenderness.  Shoulder joints, elbow joints, wrist joints, MCPs, PIPs and DIPs were in good range of motion with no synovitis.  Hip joints and knee joints were in good range of motion without any warmth swelling or effusion.  There was no tenderness over ankles or MTPs.   CDAI Exam: CDAI Score: -- Patient Global: 0 / 100; Provider Global: 0 / 100 Swollen: --; Tender: -- Joint Exam 09/10/2024   No joint exam has been documented for this visit   There is currently no information documented on the homunculus. Go to the Rheumatology activity and complete the homunculus joint exam.  Investigation: No additional findings.  Imaging: No results found.  Recent Labs: Lab Results  Component Value Date   WBC 4.7 07/09/2024   HGB 13.3 07/09/2024   PLT 159 07/09/2024   NA 139 07/09/2024   K 4.3 07/09/2024   CL 102 07/09/2024   CO2 28 07/09/2024   GLUCOSE 83 07/09/2024   BUN 19 07/09/2024   CREATININE 1.32 07/09/2024   BILITOT 0.4 07/09/2024   AST 20 07/09/2024   ALT 15 07/09/2024   PROT 7.6 07/09/2024   CALCIUM 9.6 07/09/2024   GFRAA 68 01/17/2021   QFTBGOLDPLUS NEGATIVE 10/14/2023    Speciality Comments: Prior therapy includes: methotrexate (elevated creatinine), Arava  ( rash in the past), Imuran (multiple SE's), and inadequate response to  ENBREL, HUMIRA, ORENCIA, XELJANZ and SIMPONI  ARIA.  Procedures:  No procedures performed Allergies: Tramadol , Other, Sulfa antibiotics, and Sulfur dioxide   Assessment / Plan:     Visit Diagnoses: Rheumatoid arthritis involving multiple sites with positive rheumatoid factor (HCC)-patient denies having a flare of rheumatoid arthritis since the last visit.  No synovitis was noted on the examination.  He has been taking subcutaneous Simponi  and leflunomide  on a regular basis without any interruption.  High risk medication use - Simponi  50 mg subcu every 28 days and leflunomide  10 mg p.o. daily.  July 09, 2024 CBC and CMP were normal.  TB Gold was negative on October 14, 2023.  Annual TB Gold was advised.  Patient was advised to get labs every  3 months.  Information regarding the immunization was placed in the AVS.  He was advised to hold Simponi  and leflunomide  if he develops an infection and restart once the infection resolves.  Annual skin examination to screen for skin cancer was advised.  Use of sunscreen and sun protection was discussed.  Chronic left shoulder pain - improved he had left biceps tendon repair in April 2023.  He has been followed by orthopedics.  Acute right-sided low back pain with right-sided sciatica-patient states he lifted something heavy and since then has been having increased pain and discomfort in his lower back with left-sided radiculopathy for the last 1 week.  He has seen a chiropractor and had x-rays which were unremarkable.  He has been going to the chiropractor on a regular basis.  A handout on back exercises was given.  History of gastroesophageal reflux (GERD) - he is on Pepcid  40 mg p.o. every evening and omeprazole  40 mg p.o. every morning.  Patient states he is unable to come off omeprazole  because of reflux.  Advised him to schedule an appoint with a gastroenterologist.  Patient states he was getting regular colonoscopies but he has never had an endoscopy.     Vitiligo - He was evaluated by Dr. Bard Molt.  Seasonal allergies  Hair loss - Has been followed by Dr. Molt in the past.  Orders: Orders Placed This Encounter  Procedures   QuantiFERON-TB Gold Plus   No orders of the defined types were placed in this encounter.    Follow-Up Instructions: Return in about 5 months (around 02/08/2025) for Rheumatoid arthritis.   Maya Nash, MD  Note - This record has been created using Animal nutritionist.  Chart creation errors have been sought, but may not always  have been located. Such creation errors do not reflect on  the standard of medical care.     [1]  Social History Tobacco Use   Smoking status: Never    Passive exposure: Never   Smokeless tobacco: Never  Vaping Use   Vaping status: Never Used  Substance Use Topics   Alcohol use: No   Drug use: Never   "

## 2024-08-31 ENCOUNTER — Other Ambulatory Visit: Payer: Self-pay

## 2024-08-31 ENCOUNTER — Other Ambulatory Visit: Payer: Self-pay | Admitting: Pharmacy Technician

## 2024-08-31 ENCOUNTER — Ambulatory Visit

## 2024-08-31 DIAGNOSIS — J302 Other seasonal allergic rhinitis: Secondary | ICD-10-CM

## 2024-09-02 ENCOUNTER — Other Ambulatory Visit (HOSPITAL_COMMUNITY): Payer: Self-pay

## 2024-09-02 ENCOUNTER — Telehealth: Payer: Self-pay

## 2024-09-02 NOTE — Telephone Encounter (Signed)
" ° °  Submitted a Prior Authorization request to [facility] for SIMPONI  SQ via CoverMyMeds. Will update once we receive a response.  Key: [id number] "

## 2024-09-02 NOTE — Telephone Encounter (Signed)
 Received notification from [facility] regarding a prior authorization for SIMPONI  SQ. Authorization has been APPROVED from 09/02/2024 to 09/02/2025. Approval letter sent to scan center.  Authorization # [id number]   Notification sent to CF.

## 2024-09-03 ENCOUNTER — Other Ambulatory Visit: Payer: Self-pay

## 2024-09-03 ENCOUNTER — Other Ambulatory Visit (HOSPITAL_COMMUNITY): Payer: Self-pay

## 2024-09-04 ENCOUNTER — Other Ambulatory Visit: Payer: Self-pay

## 2024-09-04 DIAGNOSIS — M0579 Rheumatoid arthritis with rheumatoid factor of multiple sites without organ or systems involvement: Secondary | ICD-10-CM

## 2024-09-04 MED ORDER — SIMPONI 50 MG/0.5ML ~~LOC~~ SOSY: PREFILLED_SYRINGE | SUBCUTANEOUS | 2 refills | Status: AC

## 2024-09-04 NOTE — Telephone Encounter (Signed)
 A new prescription is needed for Simponi . Per pharmacy note: Please [facility], ensure that pt is notified once this has been done. Thanks!    Last Fill: 07/06/2024  Labs: 07/09/2024 MCH 24.8, MCHC 30.9, MPV 13.2  TB Gold: 10/14/2023 negative    Next Visit: 09/10/2024  Last Visit: 04/09/2024  DX: Rheumatoid arthritis involving multiple sites with positive rheumatoid factor   Current Dose per office note on 04/09/2024: Simponi  50 mg subcu every 28 days   Okay to refill Simponi ?

## 2024-09-07 ENCOUNTER — Ambulatory Visit (INDEPENDENT_AMBULATORY_CARE_PROVIDER_SITE_OTHER)

## 2024-09-07 ENCOUNTER — Other Ambulatory Visit: Payer: Self-pay

## 2024-09-07 DIAGNOSIS — J302 Other seasonal allergic rhinitis: Secondary | ICD-10-CM | POA: Diagnosis not present

## 2024-09-07 NOTE — Telephone Encounter (Signed)
 Patient called and states he needs a new copay card for the Simponi  at [facility].

## 2024-09-07 NOTE — Telephone Encounter (Signed)
 Attempted to call the savings card program, however their offices are currently closed in observance of MLK. Will attempt to call back tomorrow.  Phone#: [phone]

## 2024-09-08 ENCOUNTER — Other Ambulatory Visit (HOSPITAL_COMMUNITY): Payer: Self-pay

## 2024-09-08 ENCOUNTER — Other Ambulatory Visit: Payer: Self-pay

## 2024-09-08 NOTE — Telephone Encounter (Signed)
 Spoke with patient on the phone and advised him that Alwin attempted to call the savings card program and they would not discuss anything with Alwin since the patient was not on the line. [place] attempted to conference the patient in but it went straight to voicemail. Provided patient with the phone number to call for the renewal. [phone].

## 2024-09-08 NOTE — Telephone Encounter (Signed)
 Attempted to contact the Simponi  Savings Program, was on hold for around 5 minutes before the rep seemingly disconnected the line when attempting to pick up the call. Attempted once more and finally connected. Spoke with rep who informs me that she is unable to discuss savings card information with me without the patient on the line. Attempted to get the pt on the line via conference all, however I was sent directly to VM. Provided phone number and advised him to contact them personally in order to renew his savings card.

## 2024-09-09 ENCOUNTER — Other Ambulatory Visit (HOSPITAL_COMMUNITY): Payer: Self-pay

## 2024-09-09 ENCOUNTER — Other Ambulatory Visit: Payer: Self-pay

## 2024-09-09 ENCOUNTER — Other Ambulatory Visit: Payer: Self-pay | Admitting: Pharmacist

## 2024-09-09 MED ORDER — ASMANEX HFA 200 MCG/ACT IN AERO
2.0000 | INHALATION_SPRAY | Freq: Two times a day (BID) | RESPIRATORY_TRACT | 3 refills | Status: AC
  Filled 2024-09-09: qty 13, 30d supply, fill #0

## 2024-09-09 NOTE — Progress Notes (Signed)
 Patient needs to fill with [facility] and office sent rx. Disenrolled

## 2024-09-10 ENCOUNTER — Encounter: Payer: Self-pay | Admitting: Rheumatology

## 2024-09-10 ENCOUNTER — Ambulatory Visit: Attending: Rheumatology | Admitting: Rheumatology

## 2024-09-10 VITALS — BP 144/77 | HR 69 | Temp 98.2°F | Resp 17 | Ht 70.0 in | Wt 185.2 lb

## 2024-09-10 DIAGNOSIS — M0579 Rheumatoid arthritis with rheumatoid factor of multiple sites without organ or systems involvement: Secondary | ICD-10-CM

## 2024-09-10 DIAGNOSIS — J302 Other seasonal allergic rhinitis: Secondary | ICD-10-CM | POA: Diagnosis not present

## 2024-09-10 DIAGNOSIS — G8929 Other chronic pain: Secondary | ICD-10-CM

## 2024-09-10 DIAGNOSIS — Z8719 Personal history of other diseases of the digestive system: Secondary | ICD-10-CM | POA: Diagnosis not present

## 2024-09-10 DIAGNOSIS — L80 Vitiligo: Secondary | ICD-10-CM | POA: Diagnosis not present

## 2024-09-10 DIAGNOSIS — L659 Nonscarring hair loss, unspecified: Secondary | ICD-10-CM | POA: Diagnosis not present

## 2024-09-10 DIAGNOSIS — Z79899 Other long term (current) drug therapy: Secondary | ICD-10-CM

## 2024-09-10 DIAGNOSIS — M25512 Pain in left shoulder: Secondary | ICD-10-CM

## 2024-09-10 DIAGNOSIS — M5441 Lumbago with sciatica, right side: Secondary | ICD-10-CM | POA: Diagnosis not present

## 2024-09-10 NOTE — Patient Instructions (Addendum)
 Standing Labs We placed an order today for your standing lab work.   Please have your standing labs drawn in February and every 3 months  Please have your labs drawn 2 weeks prior to your appointment so that the provider can discuss your lab results at your appointment, if possible.  Please note that you may see your imaging and lab results in MyChart before we have reviewed them. We will contact you once all results are reviewed. Please allow our office up to 72 hours to thoroughly review all of the results before contacting the office for clarification of your results.  WALK-IN LAB HOURS  Monday through Thursday from 8:00 am - 4:30 pm and Friday from 8:00 am-12:00 pm.  Patients with office visits requiring labs will be seen before walk-in labs.  You may encounter longer than normal wait times. Please allow additional time. Wait times may be shorter on  Monday and Thursday afternoons.  We do not book appointments for walk-in labs. We appreciate your patience and understanding with our staff.   Labs are drawn by [facility]. Please bring your co-pay at the time of your lab draw.  You may receive a bill from [facility] for your lab work.  Please note if you are on Hydroxychloroquine and and an order has been placed for a Hydroxychloroquine level,  you will need to have it drawn 4 hours or more after your last dose.  If you wish to have your labs drawn at another location, please call the office 24 hours in advance so we can fax the orders.  The office is located at [address], [address], [place], [place] [address]   If you have any questions regarding directions or hours of operation,  please call [phone].   As a reminder, please drink plenty of water prior to coming for your lab work. Thanks!   Vaccines You are taking a medication(s) that can suppress your immune system.  The following immunizations are recommended: Flu annually Covid-19  RSV Td/Tdap (tetanus, diphtheria, pertussis)  every 10 years Pneumonia (Prevnar 15 then Pneumovax 23 at least 1 year apart.  Alternatively, can take Prevnar 20 without needing additional dose) Shingrix: 2 doses from 4 weeks to 6 months apart  Please check with your PCP to make sure you are up to date.   If you have signs or symptoms of an infection or start antibiotics: First, call your PCP for workup of your infection. Hold your medication through the infection, until you complete your antibiotics, and until symptoms resolve if you take the following: Injectable medication (Actemra, Benlysta, Cimzia, Cosentyx, Enbrel, Humira, Kevzara, Orencia, Remicade, Simponi , Stelara, Taltz, Tremfya) Methotrexate Leflunomide  (Arava ) Mycophenolate (Cellcept) Xeljanz, Olumiant, or Rinvoq  Please get an annual skin exam to screen for skin cancer while you are on Enbrel.  Please use sunscreen and sun protection  Low Back Sprain or Strain Rehab Ask your health care provider which exercises are safe for you. Do exercises exactly as told by your health care provider and adjust them as directed. It is normal to feel mild stretching, pulling, tightness, or discomfort as you do these exercises. Stop right away if you feel sudden pain or your pain gets worse. Do not begin these exercises until told by your health care provider. Stretching and range-of-motion exercises These exercises warm up your muscles and joints and improve the movement and flexibility of your back. These exercises also help to relieve pain, numbness, and tingling. Lumbar rotation  Lie on your back  on a firm bed or the floor with your knees bent. Straighten your arms out to your sides so each arm forms a 90-degree angle (right angle) with a side of your body. Slowly move (rotate) both of your knees to one side of your body until you feel a stretch in your lower back (lumbar). Try not to let your shoulders lift off the floor. Hold this position for __________ seconds. Tense your abdominal  muscles and slowly move your knees back to the starting position. Repeat this exercise on the other side of your body. Repeat __________ times. Complete this exercise __________ times a day. Single knee to chest  Lie on your back on a firm bed or the floor with both legs straight. Bend one of your knees. Use your hands to move your knee up toward your chest until you feel a gentle stretch in your lower back and buttock. Hold your leg in this position by holding on to the front of your knee. Keep your other leg as straight as possible. Hold this position for __________ seconds. Slowly return to the starting position. Repeat with your other leg. Repeat __________ times. Complete this exercise __________ times a day. Prone extension on elbows  Lie on your abdomen on a firm bed or the floor (prone position). Prop yourself up on your elbows. Use your arms to help lift your chest up until you feel a gentle stretch in your abdomen and your lower back. This will place some of your body weight on your elbows. If this is uncomfortable, try stacking pillows under your chest. Your hips should stay down, against the surface that you are lying on. Keep your hip and back muscles relaxed. Hold this position for __________ seconds. Slowly relax your upper body and return to the starting position. Repeat __________ times. Complete this exercise __________ times a day. Strengthening exercises These exercises build strength and endurance in your back. Endurance is the ability to use your muscles for a long time, even after they get tired. Pelvic tilt This exercise strengthens the muscles that lie deep in the abdomen. Lie on your back on a firm bed or the floor with your legs extended. Bend your knees so they are pointing toward the ceiling and your feet are flat on the floor. Tighten your lower abdominal muscles to press your lower back against the floor. This motion will tilt your pelvis so your tailbone  points up toward the ceiling instead of pointing to your feet or the floor. To help with this exercise, you may place a small towel under your lower back and try to push your back into the towel. Hold this position for __________ seconds. Let your muscles relax completely before you repeat this exercise. Repeat __________ times. Complete this exercise __________ times a day. Alternating arm and leg raises  Get on your hands and knees on a firm surface. If you are on a hard floor, you may want to use padding, such as an exercise mat, to cushion your knees. Line up your arms and legs. Your hands should be directly below your shoulders, and your knees should be directly below your hips. Lift your left leg behind you. At the same time, raise your right arm and straighten it in front of you. Do not lift your leg higher than your hip. Do not lift your arm higher than your shoulder. Keep your abdominal and back muscles tight. Keep your hips facing the ground. Do not arch your back. Keep your  balance carefully, and do not hold your breath. Hold this position for __________ seconds. Slowly return to the starting position. Repeat with your right leg and your left arm. Repeat __________ times. Complete this exercise __________ times a day. Abdominal set with straight leg raise  Lie on your back on a firm bed or the floor. Bend one of your knees and keep your other leg straight. Tense your abdominal muscles and lift your straight leg up, 4-6 inches (10-15 cm) off the ground. Keep your abdominal muscles tight and hold this position for __________ seconds. Do not hold your breath. Do not arch your back. Keep it flat against the ground. Keep your abdominal muscles tense as you slowly lower your leg back to the starting position. Repeat with your other leg. Repeat __________ times. Complete this exercise __________ times a day. Single leg lower with bent knees Lie on your back on a firm bed or the  floor. Tense your abdominal muscles and lift your feet off the floor, one foot at a time, so your knees and hips are bent in 90-degree angles (right angles). Your knees should be over your hips and your lower legs should be parallel to the floor. Keeping your abdominal muscles tense and your knee bent, slowly lower one of your legs so your toe touches the ground. Lift your leg back up to return to the starting position. Do not hold your breath. Do not let your back arch. Keep your back flat against the ground. Repeat with your other leg. Repeat __________ times. Complete this exercise __________ times a day. Posture and body mechanics Good posture and healthy body mechanics can help to relieve stress in your body's tissues and joints. Body mechanics refers to the movements and positions of your body while you do your daily activities. Posture is part of body mechanics. Good posture means: Your spine is in its natural S-curve position (neutral). Your shoulders are pulled back slightly. Your head is not tipped forward (neutral). Follow these guidelines to improve your posture and body mechanics in your everyday activities. Standing  When standing, keep your spine neutral and your feet about hip-width apart. Keep a slight bend in your knees. Your ears, shoulders, and hips should line up. When you do a task in which you stand in one place for a long time, place one foot up on a stable object that is 2-4 inches (5-10 cm) high, such as a footstool. This helps keep your spine neutral. Sitting  When sitting, keep your spine neutral and keep your feet flat on the floor. Use a footrest, if necessary, and keep your thighs parallel to the floor. Avoid rounding your shoulders, and avoid tilting your head forward. When working at a desk or a computer, keep your desk at a height where your hands are slightly lower than your elbows. Slide your chair under your desk so you are close enough to maintain good  posture. When working at a computer, place your monitor at a height where you are looking straight ahead and you do not have to tilt your head forward or downward to look at the screen. Resting When lying down and resting, avoid positions that are most painful for you. If you have pain with activities such as sitting, bending, stooping, or squatting, lie in a position in which your body does not bend very much. For example, avoid curling up on your side with your arms and knees near your chest (fetal position). If you have pain with  activities such as standing for a long time or reaching with your arms, lie with your spine in a neutral position and bend your knees slightly. Try the following positions: Lying on your side with a pillow between your knees. Lying on your back with a pillow under your knees. Lifting  When lifting objects, keep your feet at least shoulder-width apart and tighten your abdominal muscles. Bend your knees and hips and keep your spine neutral. It is important to lift using the strength of your legs, not your back. Do not lock your knees straight out. Always ask for help to lift heavy or awkward objects. This information is not intended to replace advice given to you by your health care provider. Make sure you discuss any questions you have with your health care provider. Document Revised: 12/10/2022 Document Reviewed: 10/24/2020 Elsevier Patient Education  2024 [employer].

## 2024-09-16 ENCOUNTER — Other Ambulatory Visit: Payer: Self-pay

## 2024-09-23 ENCOUNTER — Ambulatory Visit (INDEPENDENT_AMBULATORY_CARE_PROVIDER_SITE_OTHER)

## 2025-02-17 ENCOUNTER — Ambulatory Visit: Admitting: Rheumatology

## 2025-06-21 ENCOUNTER — Ambulatory Visit: Admitting: Internal Medicine
